# Patient Record
Sex: Female | Born: 1940 | Race: Black or African American | Hispanic: No | Smoking: Never smoker
Health system: Southern US, Community
[De-identification: ages and names within clinical notes are randomized; demographics above are authoritative.]

## PROBLEM LIST (undated history)

## (undated) DIAGNOSIS — I10 Essential (primary) hypertension: Secondary | ICD-10-CM

## (undated) DIAGNOSIS — E785 Hyperlipidemia, unspecified: Secondary | ICD-10-CM

## (undated) DIAGNOSIS — F32A Depression, unspecified: Secondary | ICD-10-CM

## (undated) DIAGNOSIS — R011 Cardiac murmur, unspecified: Secondary | ICD-10-CM

## (undated) DIAGNOSIS — T7840XA Allergy, unspecified, initial encounter: Secondary | ICD-10-CM

## (undated) DIAGNOSIS — R519 Headache, unspecified: Secondary | ICD-10-CM

## (undated) DIAGNOSIS — K219 Gastro-esophageal reflux disease without esophagitis: Secondary | ICD-10-CM

## (undated) DIAGNOSIS — R51 Headache: Secondary | ICD-10-CM

## (undated) DIAGNOSIS — F329 Major depressive disorder, single episode, unspecified: Secondary | ICD-10-CM

## (undated) DIAGNOSIS — H269 Unspecified cataract: Secondary | ICD-10-CM

## (undated) DIAGNOSIS — Z5189 Encounter for other specified aftercare: Secondary | ICD-10-CM

## (undated) DIAGNOSIS — H409 Unspecified glaucoma: Secondary | ICD-10-CM

## (undated) DIAGNOSIS — G8929 Other chronic pain: Secondary | ICD-10-CM

## (undated) DIAGNOSIS — F419 Anxiety disorder, unspecified: Secondary | ICD-10-CM

## (undated) DIAGNOSIS — M1990 Unspecified osteoarthritis, unspecified site: Secondary | ICD-10-CM

## (undated) DIAGNOSIS — J45909 Unspecified asthma, uncomplicated: Secondary | ICD-10-CM

## (undated) HISTORY — DX: Allergy, unspecified, initial encounter: T78.40XA

## (undated) HISTORY — DX: Anxiety disorder, unspecified: F41.9

## (undated) HISTORY — DX: Cardiac murmur, unspecified: R01.1

## (undated) HISTORY — DX: Unspecified osteoarthritis, unspecified site: M19.90

## (undated) HISTORY — DX: Essential (primary) hypertension: I10

## (undated) HISTORY — DX: Hyperlipidemia, unspecified: E78.5

## (undated) HISTORY — DX: Unspecified asthma, uncomplicated: J45.909

## (undated) HISTORY — PX: MASTOIDECTOMY: SHX711

## (undated) HISTORY — DX: Headache, unspecified: R51.9

## (undated) HISTORY — DX: Gastro-esophageal reflux disease without esophagitis: K21.9

## (undated) HISTORY — DX: Depression, unspecified: F32.A

## (undated) HISTORY — DX: Major depressive disorder, single episode, unspecified: F32.9

## (undated) HISTORY — DX: Unspecified glaucoma: H40.9

## (undated) HISTORY — DX: Encounter for other specified aftercare: Z51.89

## (undated) HISTORY — PX: EYE SURGERY: SHX253

## (undated) HISTORY — DX: Unspecified cataract: H26.9

## (undated) HISTORY — DX: Headache: R51

## (undated) HISTORY — PX: SHOULDER OPEN ROTATOR CUFF REPAIR: SHX2407

## (undated) HISTORY — PX: PATENT DUCTUS ARTERIOUS REPAIR: SHX269

## (undated) HISTORY — DX: Other chronic pain: G89.29

---

## 2009-04-09 ENCOUNTER — Encounter: Admission: RE | Admit: 2009-04-09 | Discharge: 2009-04-09 | Payer: Self-pay | Admitting: Unknown Physician Specialty

## 2015-05-16 HISTORY — PX: COLONOSCOPY: SHX174

## 2015-10-10 DIAGNOSIS — I2510 Atherosclerotic heart disease of native coronary artery without angina pectoris: Secondary | ICD-10-CM

## 2015-10-10 DIAGNOSIS — E785 Hyperlipidemia, unspecified: Secondary | ICD-10-CM | POA: Insufficient documentation

## 2015-10-10 HISTORY — DX: Hyperlipidemia, unspecified: E78.5

## 2015-10-10 HISTORY — DX: Atherosclerotic heart disease of native coronary artery without angina pectoris: I25.10

## 2016-03-04 ENCOUNTER — Ambulatory Visit: Payer: Self-pay | Admitting: Neurology

## 2016-08-28 DIAGNOSIS — K219 Gastro-esophageal reflux disease without esophagitis: Secondary | ICD-10-CM | POA: Insufficient documentation

## 2016-08-28 DIAGNOSIS — I10 Essential (primary) hypertension: Secondary | ICD-10-CM

## 2016-08-28 HISTORY — DX: Essential (primary) hypertension: I10

## 2016-09-02 DIAGNOSIS — K5900 Constipation, unspecified: Secondary | ICD-10-CM

## 2016-09-02 DIAGNOSIS — I10 Essential (primary) hypertension: Secondary | ICD-10-CM

## 2016-09-02 DIAGNOSIS — M79605 Pain in left leg: Secondary | ICD-10-CM

## 2016-09-02 DIAGNOSIS — F419 Anxiety disorder, unspecified: Secondary | ICD-10-CM

## 2016-09-02 DIAGNOSIS — I209 Angina pectoris, unspecified: Secondary | ICD-10-CM

## 2016-09-02 DIAGNOSIS — E7800 Pure hypercholesterolemia, unspecified: Secondary | ICD-10-CM

## 2016-09-02 DIAGNOSIS — R208 Other disturbances of skin sensation: Secondary | ICD-10-CM

## 2016-09-02 DIAGNOSIS — K219 Gastro-esophageal reflux disease without esophagitis: Secondary | ICD-10-CM

## 2016-10-19 DIAGNOSIS — R0602 Shortness of breath: Secondary | ICD-10-CM | POA: Diagnosis not present

## 2016-10-19 DIAGNOSIS — I161 Hypertensive emergency: Secondary | ICD-10-CM | POA: Diagnosis not present

## 2016-12-08 DIAGNOSIS — Z87728 Personal history of other specified (corrected) congenital malformations of nervous system and sense organs: Secondary | ICD-10-CM | POA: Diagnosis not present

## 2016-12-08 DIAGNOSIS — R42 Dizziness and giddiness: Secondary | ICD-10-CM | POA: Diagnosis not present

## 2016-12-08 DIAGNOSIS — Z9889 Other specified postprocedural states: Secondary | ICD-10-CM | POA: Diagnosis not present

## 2016-12-11 DIAGNOSIS — I951 Orthostatic hypotension: Secondary | ICD-10-CM | POA: Diagnosis not present

## 2016-12-25 DIAGNOSIS — H402211 Chronic angle-closure glaucoma, right eye, mild stage: Secondary | ICD-10-CM | POA: Diagnosis not present

## 2016-12-29 DIAGNOSIS — Z974 Presence of external hearing-aid: Secondary | ICD-10-CM | POA: Diagnosis not present

## 2016-12-29 DIAGNOSIS — Z9889 Other specified postprocedural states: Secondary | ICD-10-CM | POA: Diagnosis not present

## 2016-12-29 DIAGNOSIS — E785 Hyperlipidemia, unspecified: Secondary | ICD-10-CM | POA: Diagnosis not present

## 2016-12-29 DIAGNOSIS — I252 Old myocardial infarction: Secondary | ICD-10-CM | POA: Diagnosis not present

## 2016-12-29 DIAGNOSIS — R51 Headache: Secondary | ICD-10-CM | POA: Diagnosis not present

## 2016-12-29 DIAGNOSIS — J449 Chronic obstructive pulmonary disease, unspecified: Secondary | ICD-10-CM | POA: Diagnosis not present

## 2016-12-29 DIAGNOSIS — Z885 Allergy status to narcotic agent status: Secondary | ICD-10-CM | POA: Diagnosis not present

## 2016-12-29 DIAGNOSIS — I2510 Atherosclerotic heart disease of native coronary artery without angina pectoris: Secondary | ICD-10-CM | POA: Diagnosis not present

## 2016-12-29 DIAGNOSIS — I110 Hypertensive heart disease with heart failure: Secondary | ICD-10-CM | POA: Diagnosis not present

## 2016-12-29 DIAGNOSIS — H538 Other visual disturbances: Secondary | ICD-10-CM | POA: Diagnosis not present

## 2016-12-29 DIAGNOSIS — Z888 Allergy status to other drugs, medicaments and biological substances status: Secondary | ICD-10-CM | POA: Diagnosis not present

## 2016-12-29 DIAGNOSIS — Z09 Encounter for follow-up examination after completed treatment for conditions other than malignant neoplasm: Secondary | ICD-10-CM | POA: Diagnosis not present

## 2016-12-29 DIAGNOSIS — I509 Heart failure, unspecified: Secondary | ICD-10-CM | POA: Diagnosis not present

## 2016-12-29 DIAGNOSIS — Q019 Encephalocele, unspecified: Secondary | ICD-10-CM | POA: Diagnosis not present

## 2016-12-29 DIAGNOSIS — H9190 Unspecified hearing loss, unspecified ear: Secondary | ICD-10-CM | POA: Diagnosis not present

## 2017-01-01 DIAGNOSIS — Z6829 Body mass index (BMI) 29.0-29.9, adult: Secondary | ICD-10-CM | POA: Diagnosis not present

## 2017-01-01 DIAGNOSIS — I10 Essential (primary) hypertension: Secondary | ICD-10-CM | POA: Diagnosis not present

## 2017-01-01 DIAGNOSIS — Z23 Encounter for immunization: Secondary | ICD-10-CM | POA: Diagnosis not present

## 2017-01-28 DIAGNOSIS — H402211 Chronic angle-closure glaucoma, right eye, mild stage: Secondary | ICD-10-CM | POA: Diagnosis not present

## 2017-03-10 DIAGNOSIS — G501 Atypical facial pain: Secondary | ICD-10-CM | POA: Diagnosis not present

## 2017-03-10 DIAGNOSIS — I10 Essential (primary) hypertension: Secondary | ICD-10-CM | POA: Diagnosis not present

## 2017-03-10 DIAGNOSIS — H6121 Impacted cerumen, right ear: Secondary | ICD-10-CM | POA: Diagnosis not present

## 2017-03-10 DIAGNOSIS — H9203 Otalgia, bilateral: Secondary | ICD-10-CM | POA: Diagnosis not present

## 2017-03-10 DIAGNOSIS — R51 Headache: Secondary | ICD-10-CM | POA: Diagnosis not present

## 2017-03-13 ENCOUNTER — Other Ambulatory Visit: Payer: Self-pay | Admitting: *Deleted

## 2017-03-13 NOTE — Patient Outreach (Addendum)
[facility]) Care Management  03/13/2017  Sheila [name] 1940-09-11 [id number]   Member identified as high risk according to [employer] health questionnaire.  Call placed to introduce [facility] care management services and perform telephone screening.  No answer, mailbox full, unable to leave a message.  Will await call back, if no call back, will follow up within the next week.    Update @ 1305:  Call received back from member stating she is at work and not a good time to talk.  She request this care manager call back Monday morning to discuss health management.  Will follow up next week.   Valente David, [place], MSN [place] [phone]

## 2017-03-16 ENCOUNTER — Other Ambulatory Visit: Payer: Self-pay | Admitting: *Deleted

## 2017-03-16 NOTE — Patient Outreach (Signed)
[place] [facility]) Care Management  03/16/2017  Rever Pichette [birth year] [id number]   Call placed back to member this morning per her request.  Identity verified, this care manager introduces self and purpose of call.  She lives alone and is independent in her care, however she report that in the past year she has had concerns regarding pain control, hypertension management, and balance.  She report seeing her primary MD, Dr. Ann Held, on a regular basis for these reasons.  She state she is in the process of getting referrals for specialists to help with managing these concerns.    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  She report compliance with medications, but express concern regarding affordability of Ranexa and eyedrops for her glaucoma/cataracts.    Member receptive to assistance from nurse case manager and pharmacist, denies the need for social worker involvement at this time.  Will place referrals.  Valente David, [place], MSN [place] [phone]

## 2017-03-17 ENCOUNTER — Encounter: Payer: Self-pay | Admitting: *Deleted

## 2017-03-19 ENCOUNTER — Other Ambulatory Visit: Payer: Self-pay

## 2017-03-19 DIAGNOSIS — M542 Cervicalgia: Secondary | ICD-10-CM | POA: Diagnosis not present

## 2017-03-19 DIAGNOSIS — H9201 Otalgia, right ear: Secondary | ICD-10-CM | POA: Diagnosis not present

## 2017-03-19 DIAGNOSIS — Z6829 Body mass index (BMI) 29.0-29.9, adult: Secondary | ICD-10-CM | POA: Diagnosis not present

## 2017-03-19 NOTE — Patient Outreach (Signed)
New referral care coordination: New referral to assist patient with hypertension, chronic pain and safety.  Placed call to patient and explained reason for call.  Patient confirmed identity.    Offered home visit and patient accepted for 03/24/2017  PLAN: home visit for assessment of needs on 03/24/2017 at 1:30. Confirmed address and provided my contact information.  Tomasa Rand, RN, BSN, CEN [facility] [employer] [phone]

## 2017-03-24 ENCOUNTER — Other Ambulatory Visit: Payer: Self-pay

## 2017-03-24 NOTE — Patient Outreach (Signed)
[facility]) Care Management   03/24/2017  Sheila [name] Mar 08, 1941 [id number]  Sheila [name] is an 76 y.o. female Arrived for home visit at 1:30pm Subjective: Patient reports that she was doing find until about a year ago she developed right ear pain. Patient reports that she has a surgery to patch a "C leak"  Reports that she continues to have balance problems, headaches and ear pain.  Had a recent MRA and was told she has another leak.  Patient reports that she is going to [facility] tomorrow for an evaluation.   Objective:   Vitals:   03/24/17 1417  BP: (!) 154/72  Pulse: 63  Resp: 18  SpO2: 95%  Weight: 164 lb (74.4 kg)  Height: 1.626 m (5\' 4" )  Awake and alert. Ambulating well today in the home.  Review of Systems  Constitutional: Negative.   HENT: Positive for ear pain.        Reports occasional nasal drainage  Eyes: Negative.   Respiratory: Positive for stridor.        Denies  Cardiovascular: Positive for chest pain.       Reports occasional chest pain.  Gastrointestinal: Negative.   Genitourinary: Negative.   Musculoskeletal: Positive for joint pain.       Shoulder pain.   Skin: Negative.   Neurological: Positive for dizziness and headaches.  Endo/Heme/Allergies: Bruises/bleeds easily.  Psychiatric/Behavioral: Positive for memory loss. The patient is nervous/anxious.        With MRI testing    Physical Exam  Constitutional: She is oriented to person, place, and time. She appears well-developed.  Cardiovascular: Normal rate, normal heart sounds and intact distal pulses.   Respiratory: Effort normal and breath sounds normal.  GI: Soft. Bowel sounds are normal.  Musculoskeletal: Normal range of motion. She exhibits no edema.  Neurological: She is alert and oriented to person, place, and time.  2018, trump, Tuesday July  Skin: Skin is warm and dry.  Psychiatric: She has a normal mood and affect. Her behavior is normal. Thought content normal.     Encounter Medications:   Outpatient Encounter Prescriptions as of 03/24/2017  Medication Sig  . acetaminophen (TYLENOL) 500 MG tablet Take 500 mg by mouth every 6 (six) hours as needed.  . ALPRAZolam (XANAX) 0.25 MG tablet Take 0.25 mg by mouth 2 (two) times daily as needed for anxiety.  Marland Kitchen aspirin EC 81 MG tablet Take 81 mg by mouth daily.  Marland Kitchen atorvastatin (LIPITOR) 20 MG tablet Take 20 mg by mouth daily.  . brimonidine (ALPHAGAN P) 0.1 % SOLN 1 drop every 12 (twelve) hours.  . carvedilol (COREG) 6.25 MG tablet Take 6.25 mg by mouth 2 (two) times daily with a meal.  . cloNIDine (CATAPRES) 0.1 MG tablet Take 0.1 mg by mouth 2 (two) times daily.  . Coenzyme Q10 (CO Q-10) 200 MG CAPS Take 1 tablet by mouth every other day.  . dorzolamide (TRUSOPT) 2 % ophthalmic solution 1 drop 3 (three) times daily.  Marland Kitchen doxycycline (VIBRAMYCIN) 100 MG capsule Take 100 mg by mouth 2 (two) times daily.  Marland Kitchen ibuprofen (ADVIL,MOTRIN) 200 MG tablet Take 200 mg by mouth every 6 (six) hours as needed.  Marland Kitchen lisinopril (PRINIVIL,ZESTRIL) 10 MG tablet Take 10 mg by mouth 2 (two) times daily.  . meclizine (ANTIVERT) 25 MG tablet Take 25 mg by mouth 3 (three) times daily as needed for dizziness.  Marland Kitchen omeprazole (PRILOSEC) 20 MG capsule Take 20 mg by mouth daily.  . predniSONE (DELTASONE)  20 MG tablet Take 20 mg by mouth daily with breakfast.  . ranolazine (RANEXA) 500 MG 12 hr tablet Take 500 mg by mouth 2 (two) times daily.  . sodium chloride (MURO 128) 5 % ophthalmic ointment 1 application.  . timolol (BETIMOL) 0.5 % ophthalmic solution 1 drop 2 (two) times daily.   No facility-administered encounter medications on file as of 03/24/2017.     Functional Status:   In your present state of health, do you have any difficulty performing the following activities: 03/24/2017  Hearing? Y  Vision? Y  Difficulty concentrating or making decisions? Y  Walking or climbing stairs? N  Dressing or bathing? N  Doing errands, shopping?  N  Preparing Food and eating ? N  Using the Toilet? N  In the past six months, have you accidently leaked urine? N  Do you have problems with loss of bowel control? N  Managing your Medications? Y  Managing your Finances? N  Housekeeping or managing your Housekeeping? N  Some recent data might be hidden    Fall/Depression Screening:    Fall Risk  03/24/2017  Falls in the past year? Yes  Number falls in past yr: 1  Injury with Fall? No  Risk Factor Category  High Fall Risk  Risk for fall due to : Impaired balance/gait  Follow up Falls evaluation completed;Falls prevention discussed   PHQ 2/9 Scores 03/24/2017  PHQ - 2 Score 2  PHQ- 9 Score 5    Assessment:   (1) reviewed [facility] program. Provided a new patient packet. Reviewed consent and written consent obtained. (2) pending MD appointment at [facility].  (3) altered balance and frequent headaches.  (4) high risk for falls. (5) positive depression screening. (6) elevated blood pressure. (7) reports high medication cost.  Plan:  (1) scanned consent into medical record. (2) encouraged patient to make a list of questions to ask prior to office visit. (3) encouraged patient to stand and get balance before walking.   (4) reviewed fall precautions. (5) patient declines wanting to take medications for depression. Reviewed with patient to stay active. (6) reviewed parameters to call MD for elevated BP.  Encouraged patient to monitor BP daily and record. Reviewed low salt diet tear off poster. (7) pending contact with [place].  Care planning and goal setting. Primary goal is to keep C leak fixed at [facility]. Patient is independent and able to care coordinate on her own.  Discussed short term goals.  Will plan to follow up with patient in the next 2 weeks.  Will send this note and barrier letter to MD.   [facility] CM Care Plan Problem One     Most Recent Value  Care Plan Problem One  Uncontrolled blood pressure.  Role  Documenting the Problem One  Care Management [place] for Problem One  Active  THN CM Short Term Goal #1   Patient will report monitoring and recording BP daily for the next 2 weeks.  THN CM Short Term Goal #1 Start Date  03/24/17  Interventions for Short Term Goal #1  Reviewed low salt diet. Provided low salt diet tear off poster.   THN CM Short Term Goal #2   Patient will report exercising twice a week for the next 2 weeks.  THN CM Short Term Goal #2 Start Date  03/24/17  Interventions for Short Term Goal #2  Reviewed importance of exercise and benefits.   THN CM Short Term Goal #3  Patient will  report no falls in the next 2 weeks.   THN CM Short Term Goal #3 Start Date  03/17/17  Interventions for Short Tern Goal #3  Reviewed importance of gaining balance before walking. reviewed importance of using assistive devices if needed.      Tomasa Rand, RN, BSN, CEN [facility] [employer] [phone]

## 2017-03-25 DIAGNOSIS — R220 Localized swelling, mass and lump, head: Secondary | ICD-10-CM | POA: Diagnosis not present

## 2017-03-25 DIAGNOSIS — Z9889 Other specified postprocedural states: Secondary | ICD-10-CM | POA: Diagnosis not present

## 2017-03-25 DIAGNOSIS — R51 Headache: Secondary | ICD-10-CM | POA: Diagnosis not present

## 2017-03-25 DIAGNOSIS — M542 Cervicalgia: Secondary | ICD-10-CM | POA: Diagnosis not present

## 2017-03-25 DIAGNOSIS — Q018 Encephalocele of other sites: Secondary | ICD-10-CM | POA: Diagnosis not present

## 2017-03-26 DIAGNOSIS — Z9889 Other specified postprocedural states: Secondary | ICD-10-CM | POA: Diagnosis not present

## 2017-03-26 DIAGNOSIS — R42 Dizziness and giddiness: Secondary | ICD-10-CM | POA: Diagnosis not present

## 2017-03-26 DIAGNOSIS — I10 Essential (primary) hypertension: Secondary | ICD-10-CM | POA: Diagnosis not present

## 2017-03-26 DIAGNOSIS — R51 Headache: Secondary | ICD-10-CM | POA: Diagnosis not present

## 2017-03-30 DIAGNOSIS — Z9089 Acquired absence of other organs: Secondary | ICD-10-CM | POA: Diagnosis not present

## 2017-03-30 DIAGNOSIS — M542 Cervicalgia: Secondary | ICD-10-CM | POA: Diagnosis not present

## 2017-03-30 DIAGNOSIS — G960 Cerebrospinal fluid leak: Secondary | ICD-10-CM | POA: Diagnosis not present

## 2017-03-30 DIAGNOSIS — J3489 Other specified disorders of nose and nasal sinuses: Secondary | ICD-10-CM | POA: Diagnosis not present

## 2017-03-31 ENCOUNTER — Other Ambulatory Visit: Payer: Self-pay | Admitting: Pharmacist

## 2017-03-31 NOTE — Patient Outreach (Signed)
[place] [facility]) Care Management  03/31/2017  Sonny Poth 08-30-1941 [id number]  Patient was referred to [place] Pharmacist by [facility] RN [facility] for medication assistance cost concerns.  Successful phone outreach to patient----HIPAA details verified.   Patient reports she is at [facility] to schedule a call back for a more convenient time and patient requested this.   Plan:  Will attempt to reach patient as scheduled at a more convenient time for her.   Karrie Meres, PharmD, [place] [phone]

## 2017-04-01 ENCOUNTER — Other Ambulatory Visit: Payer: Self-pay | Admitting: Pharmacist

## 2017-04-01 NOTE — Patient Outreach (Signed)
[place] [facility]) Care Management  [place]   04/01/2017  Sheila [name] [birth year] [id number]  Subjective:  Patient referred to [place] by [place] for patient cost concerns with an eye drop and ranolazine.   Successful phone outreach to patient, HIPAA details verified with patient.    Patient denies concerns with medication adherence.  She states she has since resolved her eye drop medication cost concerns.  She reports she is concerned with cost of Ranexa.   She reports she has [employer] MA-PDP.     Objective:   Current Medications: Current Outpatient Prescriptions  Medication Sig Dispense Refill  . acetaminophen (TYLENOL) 500 MG tablet Take 500 mg by mouth every 6 (six) hours as needed.    . ALPRAZolam (XANAX) 0.25 MG tablet Take 0.25 mg by mouth 2 (two) times daily as needed for anxiety.    Marland Kitchen aspirin EC 81 MG tablet Take 81 mg by mouth daily.    Marland Kitchen atorvastatin (LIPITOR) 20 MG tablet Take 20 mg by mouth daily.    . brimonidine (ALPHAGAN P) 0.1 % SOLN 1 drop every 12 (twelve) hours.    . carvedilol (COREG) 6.25 MG tablet Take 6.25 mg by mouth 2 (two) times daily with a meal.    . cloNIDine (CATAPRES) 0.1 MG tablet Take 0.1 mg by mouth 2 (two) times daily.    . Coenzyme Q10 (CO Q-10) 200 MG CAPS Take 1 tablet by mouth every other day.    . dorzolamide (TRUSOPT) 2 % ophthalmic solution 1 drop 3 (three) times daily.    Marland Kitchen doxycycline (VIBRAMYCIN) 100 MG capsule Take 100 mg by mouth 2 (two) times daily.    Marland Kitchen ibuprofen (ADVIL,MOTRIN) 200 MG tablet Take 200 mg by mouth every 6 (six) hours as needed.    Marland Kitchen lisinopril (PRINIVIL,ZESTRIL) 10 MG tablet Take 10 mg by mouth 2 (two) times daily.    . meclizine (ANTIVERT) 25 MG tablet Take 25 mg by mouth 3 (three) times daily as needed for dizziness.    Marland Kitchen omeprazole (PRILOSEC) 20 MG capsule Take 20 mg by mouth daily.    . predniSONE (DELTASONE) 20 MG tablet Take 20 mg by mouth daily with breakfast.     . ranolazine (RANEXA) 500 MG 12 hr tablet Take 500 mg by mouth 2 (two) times daily.    . sodium chloride (MURO 128) 5 % ophthalmic ointment 1 application.    . timolol (BETIMOL) 0.5 % ophthalmic solution 1 drop 2 (two) times daily.     No current facility-administered medications for this visit.     Functional Status: In your present state of health, do you have any difficulty performing the following activities: 03/24/2017  Hearing? Y  Vision? Y  Difficulty concentrating or making decisions? Y  Walking or climbing stairs? N  Dressing or bathing? N  Doing errands, shopping? N  Preparing Food and eating ? N  Using the Toilet? N  In the past six months, have you accidently leaked urine? N  Do you have problems with loss of bowel control? N  Managing your Medications? Y  Managing your Finances? N  Housekeeping or managing your Housekeeping? N  Some recent data might be hidden    Fall/Depression Screening: Fall Risk  03/24/2017  Falls in the past year? Yes  Number falls in past yr: 1  Injury with Fall? No  Risk Factor Category  High Fall Risk  Risk for fall due to : Impaired balance/gait  Follow up Falls evaluation completed;Falls prevention discussed   PHQ 2/9 Scores 03/24/2017  PHQ - 2 Score 2  PHQ- 9 Score 5    Assessment:  Medication review per patient report:   Drugs sorted by system:  Neurologic/Psychologic: -alprazolam as needed -meclizine  Cardiovascular: -aspirin -atorvastatin---reports she takes every other day  -carvedilol -clonidine---reports she only takes when systolic blood pressure >627 -lisinopril  -ranolazine (Ranexa)   Gastrointestinal: -omeprazole   Topical: -brimonidine eye drops  -dorzolamide eye drops  -sodium chloride eye drops -timolol eye drops   Pain: -acetaminophen as needed -ibuprofen as needed   Miscellaneous: -co-Q-10   Medications to avoid in the elderly:  -meclizine---increased risk of falls in >75 year old  population  Other issues noted:  -patient reports she completed doxycycline and prednisone course and these were removed from her medication list.   -suggest close monitoring of patient's renal function with ACE inhibitor and NSAID use  Medication assistance:  Patient reports income exceeds [facility] Extra Help requirements.   Had placed call to [employer] prior to calling patient---was told Medicare beneficiaries are evaluated on a case by case basis but generally are not eligible if they have prescription coverage of Ranexa.  Per review of plan preferred drug list, Ranexa is Tier 3 medication.  Discussed with patient she may need to wait until she enters the coverage gap to see if she is eligible for manufacturer assistance based on representative from [employer].    Discussed with patient co-pay structure of her Part D plan and costs when in coverage gap based on review of plan summary of benefits.    Plan:  As patient denies questions about her medications---will not open pharmacy episode.    Will route note to PCP.   Patient provided with [facility] Pharmacist phone number and encouraged to call if new concerns arise.    She was counseled to take her medications as prescribed by her prescribers.     Karrie Meres, PharmD, [place] [phone]

## 2017-04-07 ENCOUNTER — Other Ambulatory Visit: Payer: Self-pay

## 2017-04-07 NOTE — Patient Outreach (Signed)
Follow up telephone call/ case closure:  Placed call to patient who answers and states that she is doing well. Reports that she has her follow up at [facility] and is planning to take a nasal sample back to [facility] when she collects it.  Reports BP is better running 130/70.  Reports that she has been able to do some exercise but reports that she is lazy at times.   Reviewed plan of care and patient denies any other needs. Reports that she will follow up at [facility] as needed.  PLAN: Patient denies any new needs and agrees to case closure.   Will notify MD and send patient case closure letter.  Will ask [facility] care management assistant to close case.   THN CM Care Plan Problem One     Most Recent Value  Care Plan Problem One  Uncontrolled blood pressure.  Role Documenting the Problem One  Care Management [place] for Problem One  Active  THN CM Short Term Goal #1   Patient will report monitoring and recording BP daily for the next 2 weeks.  THN CM Short Term Goal #1 Start Date  03/24/17  [facility] CM Short Term Goal #1 Met Date  04/07/17  Interventions for Short Term Goal #1  Reviewed low salt diet. Provided low salt diet tear off poster.   THN CM Short Term Goal #2   Patient will report exercising twice a week for the next 2 weeks.  THN CM Short Term Goal #2 Start Date  03/24/17  [facility] CM Short Term Goal #2 Met Date  04/07/17  Interventions for Short Term Goal #2  Reviewed importance of exercise and benefits.   THN CM Short Term Goal #3  Patient will report no falls in the next 2 weeks.   THN CM Short Term Goal #3 Start Date  03/17/17  [facility] CM Short Term Goal #3 Met Date  04/07/17  Interventions for Short Tern Goal #3  Reviewed importance of gaining balance before walking. reviewed importance of using assistive devices if needed.      Tomasa Rand, RN, BSN, CEN [facility] [employer] [phone]

## 2017-04-27 DIAGNOSIS — I950 Idiopathic hypotension: Secondary | ICD-10-CM | POA: Diagnosis not present

## 2017-04-27 DIAGNOSIS — R0602 Shortness of breath: Secondary | ICD-10-CM | POA: Diagnosis not present

## 2017-04-27 DIAGNOSIS — R42 Dizziness and giddiness: Secondary | ICD-10-CM | POA: Diagnosis not present

## 2017-04-27 DIAGNOSIS — T448X5A Adverse effect of centrally-acting and adrenergic-neuron-blocking agents, initial encounter: Secondary | ICD-10-CM | POA: Diagnosis not present

## 2017-04-27 DIAGNOSIS — E785 Hyperlipidemia, unspecified: Secondary | ICD-10-CM | POA: Diagnosis not present

## 2017-04-27 DIAGNOSIS — I959 Hypotension, unspecified: Secondary | ICD-10-CM | POA: Diagnosis not present

## 2017-04-27 DIAGNOSIS — M542 Cervicalgia: Secondary | ICD-10-CM | POA: Diagnosis not present

## 2017-04-27 DIAGNOSIS — Z79899 Other long term (current) drug therapy: Secondary | ICD-10-CM | POA: Diagnosis not present

## 2017-04-27 DIAGNOSIS — R001 Bradycardia, unspecified: Secondary | ICD-10-CM | POA: Diagnosis not present

## 2017-04-27 DIAGNOSIS — I10 Essential (primary) hypertension: Secondary | ICD-10-CM | POA: Diagnosis not present

## 2017-04-27 DIAGNOSIS — G8929 Other chronic pain: Secondary | ICD-10-CM | POA: Diagnosis not present

## 2017-04-27 DIAGNOSIS — H409 Unspecified glaucoma: Secondary | ICD-10-CM | POA: Diagnosis not present

## 2017-04-27 DIAGNOSIS — R110 Nausea: Secondary | ICD-10-CM | POA: Diagnosis not present

## 2017-04-27 DIAGNOSIS — R51 Headache: Secondary | ICD-10-CM | POA: Diagnosis not present

## 2017-04-27 DIAGNOSIS — I9589 Other hypotension: Secondary | ICD-10-CM | POA: Diagnosis not present

## 2017-04-28 DIAGNOSIS — I10 Essential (primary) hypertension: Secondary | ICD-10-CM | POA: Diagnosis not present

## 2017-04-28 DIAGNOSIS — I959 Hypotension, unspecified: Secondary | ICD-10-CM | POA: Diagnosis not present

## 2017-04-28 DIAGNOSIS — R51 Headache: Secondary | ICD-10-CM | POA: Diagnosis not present

## 2017-04-28 DIAGNOSIS — R42 Dizziness and giddiness: Secondary | ICD-10-CM | POA: Diagnosis not present

## 2017-04-28 DIAGNOSIS — R001 Bradycardia, unspecified: Secondary | ICD-10-CM | POA: Diagnosis not present

## 2017-05-01 ENCOUNTER — Other Ambulatory Visit: Payer: Self-pay

## 2017-05-01 NOTE — Patient Outreach (Signed)
New referral for screening: [employer].  New referral for patient who was recently discharged from outlying facility.   Spoke with office staff who reports that patient was admitted to [facility] on 04/27/2017 and discharged on 04/28/2017 for dizziness and bradycardia.  Reviewed Medical Record at [facility] and appears patient went to the emergency department for hypertension and was given medication that dropped her heart rate and blood pressure.   Patient was discharged home with medication changes.  Placed call to patient with no answer. Voicemail states that it is full and unable to leave a message.  PLAN: will contact patient on next business day Tomasa Rand, Therapist, sports, Copywriter, advertising, [facility] [employer] [phone].

## 2017-05-04 ENCOUNTER — Other Ambulatory Visit: Payer: Self-pay

## 2017-05-04 DIAGNOSIS — I10 Essential (primary) hypertension: Secondary | ICD-10-CM | POA: Diagnosis not present

## 2017-05-04 DIAGNOSIS — Z6829 Body mass index (BMI) 29.0-29.9, adult: Secondary | ICD-10-CM | POA: Diagnosis not present

## 2017-05-04 DIAGNOSIS — Z23 Encounter for immunization: Secondary | ICD-10-CM | POA: Diagnosis not present

## 2017-05-04 NOTE — Patient Outreach (Signed)
Screening: Recent patient of mine who had a dizzy episodes and went to the [facility] emergency department with hypertension.  Patient reports that she had a bad side effect of the blood pressure medications that she was given. Reports her BP and heart rate dropped and she thought she was dying.  Reports that she was admitted overnight and then discharged home.  Reports that she occasionally feels weak and short of breath. Reports that she has a follow up scheduled with Dr. Nicki Reaper today and the he will review her medications.    Offered to reopen patient but she does not feel she needs it at this time. States that she has my card and will call me if needed.  PLAN: will no reopen at this time as patient has declined services.  Tomasa Rand, RN, BSN, CEN [facility] [employer] [phone]

## 2017-05-07 DIAGNOSIS — R51 Headache: Secondary | ICD-10-CM | POA: Diagnosis not present

## 2017-05-07 DIAGNOSIS — J019 Acute sinusitis, unspecified: Secondary | ICD-10-CM | POA: Diagnosis not present

## 2017-05-18 DIAGNOSIS — H402211 Chronic angle-closure glaucoma, right eye, mild stage: Secondary | ICD-10-CM | POA: Diagnosis not present

## 2017-07-06 DIAGNOSIS — I10 Essential (primary) hypertension: Secondary | ICD-10-CM | POA: Diagnosis not present

## 2017-07-06 DIAGNOSIS — R51 Headache: Secondary | ICD-10-CM | POA: Diagnosis not present

## 2017-07-07 ENCOUNTER — Ambulatory Visit (INDEPENDENT_AMBULATORY_CARE_PROVIDER_SITE_OTHER): Payer: PPO | Admitting: Cardiology

## 2017-07-07 ENCOUNTER — Encounter: Payer: Self-pay | Admitting: Cardiology

## 2017-07-07 VITALS — BP 102/58 | HR 66 | Ht 63.0 in | Wt 166.8 lb

## 2017-07-07 DIAGNOSIS — I10 Essential (primary) hypertension: Secondary | ICD-10-CM | POA: Diagnosis not present

## 2017-07-07 DIAGNOSIS — E785 Hyperlipidemia, unspecified: Secondary | ICD-10-CM | POA: Diagnosis not present

## 2017-07-07 DIAGNOSIS — I2510 Atherosclerotic heart disease of native coronary artery without angina pectoris: Secondary | ICD-10-CM

## 2017-07-07 NOTE — Patient Instructions (Signed)
Medication Instructions:  Your physician recommends that you continue on your current medications as directed. Please refer to the Current Medication list given to you today.  Labwork: None  Testing/Procedures: None  Follow-Up: Your physician recommends that you schedule a follow-up appointment in: 6 months  Any Other Special Instructions Will Be Listed Below (If Applicable).     If you need a refill on your cardiac medications before your next appointment, please call your pharmacy.   [facility]  Chalsey Leeth A, RN, BSN  

## 2017-07-07 NOTE — Progress Notes (Signed)
Cardiology Office Note:    Date:  07/07/2017   ID:  Sheila [name], DOB [birth year], MRN [id number]  PCP:  Myer Peer, MD  Cardiologist:  Jenean Lindau, MD   Referring MD: Myer Peer, MD    ASSESSMENT:    1. Coronary artery disease involving native coronary artery of native heart without angina pectoris   2. Essential hypertension   3. Dyslipidemia    PLAN:    In order of problems listed above:  1. I discussed my findings with the patient extensively. Secondary prevention stressed to the patient. Importance of compliance with diet and medications stressed. 2. Her blood pressure stable. She keeps track of her blood pressure extensively and worries about it. I told her to get her mind of the blood pressure. I told her that her blood pressure readings are stable. 3. Importance of regular exercise stressed and she vocalized understanding. Her blood work is followed by her primary care physician. 4. Patient will be seen in follow-up appointment in 6 months or earlier if the patient has any concerns.    Medication Adjustments/Labs and Tests Ordered: Current medicines are reviewed at length with the patient today.  Concerns regarding medicines are outlined above.  No orders of the defined types were placed in this encounter.  No orders of the defined types were placed in this encounter.    History of Present Illness:    Sheila [name] is a 76 y.o. female who is being seen today for the evaluation of coronary artery disease and essential hypertension at the request of Myer Peer, MD. Patient is a pleasant 76 year old female. She has past medical history of coronary artery disease, essential hypertension and dyslipidemia. She has been seen in the emergency room for essential hypertension. She denies any problems at this time and takes care of activities of daily living. No chest pain orthopnea or PND. She had headache like sensations and went to the emergency room and was  evaluated. Her blood pressures were fine and her CT scans of the head was unremarkable. She is seen here today with her friend. She denies any chest pain orthopnea or PND. At the time of my evaluation she is alert awake oriented and in no distress.  Past Medical History:  Diagnosis Date  . Allergy   . Anxiety   . Arthritis   . Asthma   . Blood transfusion without reported diagnosis   . Cataract   . Depression   . GERD (gastroesophageal reflux disease)   . Glaucoma   . Heart murmur   . Hyperlipidemia   . Hypertension     Past Surgical History:  Procedure Laterality Date  . CESAREAN SECTION    . EYE SURGERY    . MASTOIDECTOMY    . PATENT DUCTUS ARTERIOUS REPAIR    . SHOULDER OPEN ROTATOR CUFF REPAIR Bilateral     Current Medications: Current Meds  Medication Sig  . acetaminophen (TYLENOL) 500 MG tablet Take 500 mg by mouth every 6 (six) hours as needed.  . ALPRAZolam (XANAX) 0.25 MG tablet Take 0.25 mg by mouth 2 (two) times daily as needed for anxiety.  Marland Kitchen aspirin EC 81 MG tablet Take 81 mg by mouth daily.  Marland Kitchen atorvastatin (LIPITOR) 20 MG tablet Take 20 mg by mouth every other day.   . brimonidine (ALPHAGAN) 0.15 % ophthalmic solution Place 1 drop into both eyes every 12 (twelve) hours.   . carvedilol (COREG) 6.25 MG tablet Take 6.25 mg by mouth  2 (two) times daily with a meal.  . cloNIDine (CATAPRES) 0.1 MG tablet Take 0.1 mg by mouth 2 (two) times daily.  . Coenzyme Q10 (CO Q-10) 200 MG CAPS Take 1 tablet by mouth every other day.  . dorzolamide (TRUSOPT) 2 % ophthalmic solution Place 1 drop into both eyes daily.   . furosemide (LASIX) 40 MG tablet Take 40 mg by mouth daily as needed.  . hydrALAZINE (APRESOLINE) 50 MG tablet Take 50 mg by mouth 3 (three) times daily.  . hydrochlorothiazide (HYDRODIURIL) 12.5 MG tablet Take 0.5 mg by mouth daily.   Marland Kitchen lisinopril (PRINIVIL,ZESTRIL) 10 MG tablet Take 10 mg by mouth 2 (two) times daily.  . meclizine (ANTIVERT) 25 MG tablet Take  25 mg by mouth 3 (three) times daily as needed for dizziness.  Marland Kitchen omeprazole (PRILOSEC) 20 MG capsule Take 20 mg by mouth daily.  . ranolazine (RANEXA) 500 MG 12 hr tablet Take 500 mg by mouth 2 (two) times daily.  . sodium chloride (MURO 128) 2 % ophthalmic solution Place 1 drop into both eyes at bedtime.  . timolol (BETIMOL) 0.5 % ophthalmic solution Place 1 drop into both eyes 2 (two) times daily.      Allergies:   Antihistamines, chlorpheniramine-type; Ativan [lorazepam]; and Morphine   Social History   Social History  . Marital status: Unknown    Spouse name: N/A  . Number of children: N/A  . Years of education: N/A   Social History Main Topics  . Smoking status: Never Smoker  . Smokeless tobacco: Never Used  . Alcohol use No  . Drug use: No  . Sexual activity: Not Asked   Other Topics Concern  . None   Social History Narrative  . None     Family History: The patient's family history includes Cancer in her brother; Heart attack in her mother and sister.  ROS:   Please see the history of present illness.    All other systems reviewed and are negative.  EKGs/Labs/Other Studies Reviewed:    The following studies were reviewed today: I discussed my findings with the patient at extensive length and hospital records were reviewed by me at length.   Recent Labs: No results found for requested labs within last 8760 hours.  Recent Lipid Panel No results found for: CHOL, TRIG, HDL, CHOLHDL, VLDL, LDLCALC, LDLDIRECT  Physical Exam:    VS:  BP (!) 102/58   Pulse 66   Ht 5\' 3"  (1.6 m)   Wt 166 lb 12.8 oz (75.7 kg)   SpO2 96%   BMI 29.55 kg/m     Wt Readings from Last 3 Encounters:  07/07/17 166 lb 12.8 oz (75.7 kg)  03/24/17 164 lb (74.4 kg)     GEN: Patient is in no acute distress HEENT: Normal NECK: No JVD; No carotid bruits LYMPHATICS: No lymphadenopathy CARDIAC: S1 S2 regular, 2/6 systolic murmur at the apex. RESPIRATORY:  Clear to auscultation without  rales, wheezing or rhonchi  ABDOMEN: Soft, non-tender, non-distended MUSCULOSKELETAL:  No edema; No deformity  SKIN: Warm and dry NEUROLOGIC:  Alert and oriented x 3 PSYCHIATRIC:  Normal affect    Signed, Jenean Lindau, MD  07/07/2017 2:40 PM    [facility]

## 2017-08-07 DIAGNOSIS — H8109 Meniere's disease, unspecified ear: Secondary | ICD-10-CM | POA: Diagnosis not present

## 2017-08-07 DIAGNOSIS — I10 Essential (primary) hypertension: Secondary | ICD-10-CM | POA: Diagnosis not present

## 2017-08-07 DIAGNOSIS — Z6829 Body mass index (BMI) 29.0-29.9, adult: Secondary | ICD-10-CM | POA: Diagnosis not present

## 2017-08-07 DIAGNOSIS — M791 Myalgia, unspecified site: Secondary | ICD-10-CM | POA: Diagnosis not present

## 2017-08-29 DIAGNOSIS — R51 Headache: Secondary | ICD-10-CM | POA: Diagnosis not present

## 2017-09-10 DIAGNOSIS — H402211 Chronic angle-closure glaucoma, right eye, mild stage: Secondary | ICD-10-CM | POA: Diagnosis not present

## 2017-09-24 DIAGNOSIS — Z6829 Body mass index (BMI) 29.0-29.9, adult: Secondary | ICD-10-CM | POA: Diagnosis not present

## 2017-09-24 DIAGNOSIS — R0989 Other specified symptoms and signs involving the circulatory and respiratory systems: Secondary | ICD-10-CM | POA: Diagnosis not present

## 2017-09-24 DIAGNOSIS — R2689 Other abnormalities of gait and mobility: Secondary | ICD-10-CM | POA: Diagnosis not present

## 2017-09-28 DIAGNOSIS — R51 Headache: Secondary | ICD-10-CM | POA: Diagnosis not present

## 2017-09-28 DIAGNOSIS — H6123 Impacted cerumen, bilateral: Secondary | ICD-10-CM | POA: Diagnosis not present

## 2017-10-02 DIAGNOSIS — R42 Dizziness and giddiness: Secondary | ICD-10-CM | POA: Diagnosis not present

## 2017-10-02 DIAGNOSIS — R0989 Other specified symptoms and signs involving the circulatory and respiratory systems: Secondary | ICD-10-CM | POA: Diagnosis not present

## 2017-10-22 ENCOUNTER — Encounter (INDEPENDENT_AMBULATORY_CARE_PROVIDER_SITE_OTHER): Payer: Self-pay

## 2017-10-22 ENCOUNTER — Encounter: Payer: Self-pay | Admitting: Neurology

## 2017-10-22 ENCOUNTER — Ambulatory Visit (INDEPENDENT_AMBULATORY_CARE_PROVIDER_SITE_OTHER): Payer: PPO | Admitting: Neurology

## 2017-10-22 VITALS — BP 126/65 | HR 61 | Ht 63.0 in | Wt 166.0 lb

## 2017-10-22 DIAGNOSIS — R51 Headache: Secondary | ICD-10-CM | POA: Diagnosis not present

## 2017-10-22 DIAGNOSIS — R42 Dizziness and giddiness: Secondary | ICD-10-CM | POA: Diagnosis not present

## 2017-10-22 DIAGNOSIS — R519 Headache, unspecified: Secondary | ICD-10-CM

## 2017-10-22 NOTE — Progress Notes (Signed)
Subjective:    Patient ID: Sheila [name] is a 77 y.o. female.  HPI     Star Age, MD, PhD [facility] [address], [address] [place], [place] [address]  Dear Dr. Nicki Reaper,   I saw your patient, Sheila [name], upon your kind request in my neurologic clinic today for initial consultation of her balance problem. The patient is unaccompanied today. 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 I reviewed her extensive records that you kindly sent. She had been followed at [facility] neurosurgery by Dr. Rogers Blocker, last seen in 2018 at which time patient reported improvement in her dizziness and recurrent headaches. Headaches were still expected according to Dr. Rogers Blocker note. She had interim right facial swelling and tenderness in the neck and shoulder area. Which she was treated with antibiotics. She had a repeat brain MRI at the time in July 2018. This did not show any new changes I reviewed your office note from 09/24/2017. She has had multiple scans in the recent past. She had not had a scan at [facility]. I did review a brain MRI report with and without contrast on 10/21/2016 done at [facility] with indication of recurrent right-sided head pain and dizziness. Impression: No acute intracranial abnormality. Postoperative changes and evidence of remote subarachnoid hemorrhage.  She presented to [facility] in [place] on 07/06/2017 with acute on chronic right temporoparietal headaches. I reviewed the ER records.   Her last MRI was at [facility] on 03/26/18 and I reviewed the report, which she brought: Impression: No acute intracranial abnormality or abnormal enhancement of the brain. Stable mild chronic microvascular ischemic changes and mild parenchymal volume loss of the brain. 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 Postsurgical changes related to right mastoidectomy and temporal craniotomy. No fluid collection or abnormal enhancement. She went to the emergency room at [facility] in January. She reports having had a head CT at the time. I do not see records in the computer of this and she did not have a report with her. She says that she was treated with medication for her headache which helped a little bit. Of note, she was supposed to have some physical therapy last time she saw her neurosurgeon at [facility] but she never got a referral. She has a cane available as well as a walker, she has thankfully not fallen but has stumbled. She describes a sense of pulsatile pressure sensation on the top of her head and also with sudden position changes. Some of her symptoms remind her of the symptoms she had prior to her original surgery in 2017. One of her sons lives with her. She is divorced, she has 5 children, 2 and Nora, one in [place] and one in [place]. 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 She is a nonsmoker and does not drink alcohol and does not drink caffeine daily. Her original tinnitus surgery was done under Dr. Cresenciano Lick, ENT.  Her Past Medical History Is Significant For: Past Medical History:  Diagnosis Date  . Allergy   . Anxiety   .  Arthritis   . Asthma   . Blood transfusion without reported diagnosis   . Cataract   . Depression   . GERD (gastroesophageal reflux disease)   . Glaucoma   . Heart murmur   . Hyperlipidemia   . Hypertension     Her Past Surgical History Is Significant For: Past Surgical History:  Procedure  Laterality Date  . CESAREAN SECTION    . EYE SURGERY    . MASTOIDECTOMY    . PATENT DUCTUS ARTERIOUS REPAIR    . SHOULDER OPEN ROTATOR CUFF REPAIR Bilateral     Her Family History Is Significant For: Family History  Problem Relation Age of Onset  . Heart attack Mother   . Heart attack Sister   . Cancer Brother     Her Social History Is Significant For: Social History   Socioeconomic History  . Marital status: Unknown    Spouse name: None  . Number of children: None  . Years of education: None  . Highest education level: None  Social Needs  . Financial resource strain: None  . Food insecurity - worry: None  . Food insecurity - inability: None  . Transportation needs - medical: None  . Transportation needs - non-medical: None  Occupational History  . None  Tobacco Use  . Smoking status: Never Smoker  . Smokeless tobacco: Never Used  Substance and Sexual Activity  . Alcohol use: No  . Drug use: No  . Sexual activity: None  Other Topics Concern  . None  Social History Narrative  . None    Her Allergies Are:  Allergies  Allergen Reactions  . Antihistamines, Chlorpheniramine-Type Anaphylaxis  . Ativan [Lorazepam] Swelling  . Ceftin [Cefuroxime Axetil]   . Meloxicam   . Pheneen [Benzalkonium Chloride]   . Morphine Palpitations  :   Her Current Medications Are:  Outpatient Encounter Medications as of 10/22/2017  Medication Sig  . acetaminophen (TYLENOL) 500 MG tablet Take 500 mg by mouth every 6 (six) hours as needed.  . ALPRAZolam (XANAX) 0.25 MG tablet Take 0.25 mg by mouth 2 (two) times daily as needed for anxiety.  Marland Kitchen aspirin EC 81 MG tablet Take 81 mg by mouth daily.  Marland Kitchen atorvastatin (LIPITOR) 20 MG tablet Take 20 mg by mouth every other day.   . brimonidine (ALPHAGAN) 0.15 % ophthalmic solution Place 1 drop into both eyes every 12 (twelve) hours.   . carvedilol (COREG) 6.25 MG tablet Take 6.25 mg by mouth 2 (two) times daily with a meal.  . cloNIDine  (CATAPRES) 0.1 MG tablet Take 0.1 mg by mouth 2 (two) times daily.  . Coenzyme Q10 (CO Q-10) 200 MG CAPS Take 1 tablet by mouth every other day.  . dorzolamide (TRUSOPT) 2 % ophthalmic solution Place 1 drop into both eyes daily.   . furosemide (LASIX) 40 MG tablet Take 40 mg by mouth daily as needed.  . hydrochlorothiazide (HYDRODIURIL) 12.5 MG tablet Take 0.5 mg by mouth daily.   Marland Kitchen lisinopril (PRINIVIL,ZESTRIL) 10 MG tablet Take 10 mg by mouth 2 (two) times daily.  . meclizine (ANTIVERT) 25 MG tablet Take 25 mg by mouth 3 (three) times daily as needed for dizziness.  Marland Kitchen omeprazole (PRILOSEC) 20 MG capsule Take 20 mg by mouth daily.  . ranolazine (RANEXA) 500 MG 12 hr tablet Take 500 mg by mouth 2 (two) times daily.  . sodium chloride (MURO 128) 2 % ophthalmic solution Place 1 drop into both eyes at bedtime.  . timolol (BETIMOL)  0.5 % ophthalmic solution Place 1 drop into both eyes 2 (two) times daily.   . [DISCONTINUED] hydrALAZINE (APRESOLINE) 50 MG tablet Take 50 mg by mouth 3 (three) times daily.   No facility-administered encounter medications on file as of 10/22/2017.   :   Review of Systems:  Out of a complete 14 point review of systems, all are reviewed and negative with the exception of these symptoms as listed below:   Review of Systems  Neurological:       Pt presents today to discuss her headaches and dizziness. For about a year, she has had pressure and "pulsating" in her head. Pt is also complaining of dizziness.    Objective:  Neurological Exam  Physical Exam Physical Examination:   Vitals:   10/22/17 1015  BP: 126/65  Pulse: 61    General Examination: The patient is a very pleasant 77 y.o. female in no significant distress. She appears well-developed and well groomed. On orthostatic testing, she has no significant changes. Lying blood pressure 126/73 with a pulse of 63, sitting 126/65 with a pulse of 61, standing 119/67 with a pulse of 70. She has no orthostatic  symptoms.  HEENT: Pupils are equal, round and reactive to light and accommodation. Extraocular tracking is good without limitation to gaze excursion or nystagmus noted. Normal smooth pursuit is noted. Hearing is grossly intact. Face is symmetric with normal facial animation and normal facial sensation. Speech is clear with no dysarthria noted. There is no hypophonia. There is no lip, neck/head, jaw or voice tremor. Neck is supple with full range of passive and active motion. There are no carotid bruits on auscultation. Oropharynx exam reveals: mild mouth dryness, adequate dental hygiene. Tongue protrudes centrally and palate elevates symmetrically.   Chest: Clear to auscultation without wheezing, rhonchi or crackles noted.  Heart: S1+S2+0, regular and normal without murmurs, rubs or gallops noted.   Abdomen: Soft, non-tender and non-distended with normal bowel sounds appreciated on auscultation.  Extremities: There is no pitting edema in the distal lower extremities bilaterally. Pedal pulses are intact.  Skin: Warm and dry without trophic changes noted.  Musculoskeletal: exam reveals no obvious joint deformities, tenderness or joint swelling or erythema.   Neurologically:  Mental status: The patient is awake, alert and oriented in all 4 spheres. Her immediate and remote memory, attention, language skills and fund of knowledge are appropriate. There is no evidence of aphasia, agnosia, apraxia or anomia. Speech is clear with normal prosody and enunciation. Thought process is linear. Mood is normal and affect is normal.  Cranial nerves II - XII are as described above under HEENT exam. In addition: shoulder shrug is normal with equal shoulder height noted. Motor exam: Normal bulk, strength and tone is noted. There is no drift, tremor or rebound. Romberg is negative. Reflexes are 2+ throughout. Babinski: Toes are flexor bilaterally. Fine motor skills and coordination: intact with normal finger taps,  normal hand movements, normal rapid alternating patting, normal foot taps and normal foot agility.  Cerebellar testing: No dysmetria or intention tremor on finger to nose testing. Heel to shin is unremarkable bilaterally. There is no truncal or gait ataxia.  Sensory exam: intact to light touch, vibration, temperature sense in the upper and lower extremities.  Gait, station and balance: She stands without difficulty. No veering to one side is noted. No leaning to one side is noted. Posture is age-appropriate and stance is narrow based. Gait shows slightly cautious gait, normal stride length.  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 She has been to the emergency room in the recent past twice. Unfortunately, her situation is complicated. Her symptoms resemble the ones that she had before she had her surgeries starting in 2017. She is strongly encouraged go back to see her neurosurgeon. Migraine prevention medication would not be indicated in her case and made mask serious symptomatology. She understands this. Neurologically she has a nonfocal exam which is reassuring. I would not recommend ordering an MRI through a local radiology placed here as she should have her MRI through the same place for comparison. She also understands this. She was supposed to pursue physical therapy which she has not yet. She is encouraged to follow up on the referral status on the physical therapy referral. She is advised to start using her cane or walker for gait  stability, stay well-hydrated. I had an extensive discussion with the patient today. I reviewed extensive records that were available through your paper referral as well as through the electronic record system. She is at this point advised to make an appointment with her neurosurgeon at [facility]. I answered all her questions today and she was in agreement.  Thank you very much for allowing me to participate in the care of this nice patient. If I can be of any further assistance to you please do not hesitate to call me at [phone].  Sincerely,   Star Age, MD, PhD

## 2017-10-22 NOTE — Patient Instructions (Addendum)
I am afraid, I have not much to offer you. I feel bad, that you have had so much trouble with recurrent headaches and dizziness. Your situation is complicated, as you know, I do not see an obvious abnormality on my exam today. You are advised to follow up with your neurosurgeon at [place] may need another MRI, and it needs to be compared to the last one in July 2018.  Your blood pressure values did not drop significantly when you change positions. I would not recommend any additional headache medication as we do not want to mask any other symptoms. The way you are describing your headache and symptoms remind you of your original symptoms from before you had your surgeries. Therefore, you need to see your surgeon again.  Consider seeing physical therapy as planned by Dr. Rogers Blocker.  Please use a walker or cane for safety.

## 2017-11-10 ENCOUNTER — Telehealth: Payer: Self-pay | Admitting: Neurology

## 2017-11-10 DIAGNOSIS — R51 Headache: Secondary | ICD-10-CM | POA: Diagnosis not present

## 2017-11-10 DIAGNOSIS — R519 Headache, unspecified: Secondary | ICD-10-CM | POA: Insufficient documentation

## 2017-11-10 DIAGNOSIS — L818 Other specified disorders of pigmentation: Secondary | ICD-10-CM | POA: Diagnosis not present

## 2017-11-10 DIAGNOSIS — Z9889 Other specified postprocedural states: Secondary | ICD-10-CM | POA: Diagnosis not present

## 2017-11-10 HISTORY — DX: Headache, unspecified: R51.9

## 2017-11-10 NOTE — Telephone Encounter (Signed)
Sheila [name] [phone] (friend on DPR) called, pt had MRI today at [facility], results were normal findings of the brain. Pt was advised by Dr Rogers Blocker she should follow up with Dr Rexene Alberts. She is aware last OV notes state to follow up with PCP but she was insistent on an appt. Please call to advise the patient at [phone].

## 2017-11-12 DIAGNOSIS — R2689 Other abnormalities of gait and mobility: Secondary | ICD-10-CM | POA: Diagnosis not present

## 2017-11-12 DIAGNOSIS — M62552 Muscle wasting and atrophy, not elsewhere classified, left thigh: Secondary | ICD-10-CM | POA: Diagnosis not present

## 2017-11-12 DIAGNOSIS — M62551 Muscle wasting and atrophy, not elsewhere classified, right thigh: Secondary | ICD-10-CM | POA: Diagnosis not present

## 2017-11-12 DIAGNOSIS — M6281 Muscle weakness (generalized): Secondary | ICD-10-CM | POA: Diagnosis not present

## 2017-11-16 NOTE — Telephone Encounter (Signed)
Pt returned my call. I advised her of Dr. Guadelupe Sabin recommendations. Pt says that [facility] did not want to see her again either. I encouraged her to follow up with PCP. Pt verbalized understanding.

## 2017-11-16 NOTE — Telephone Encounter (Signed)
I called pt again to discuss. No answer, VM still full.

## 2017-11-16 NOTE — Telephone Encounter (Signed)
I called pt. I advised her that Dr. Guadelupe Sabin last note says that she does not have much else to offer pt with regards to her headaches and dizziness. Pt insists that Dr. Rexene Alberts told her to have another MRI to make sure that her headaches did not have anything to do with her surgery. If nothing was seen to correlate her headaches with her surgery, pt says that Dr. Rexene Alberts told her to call our office. Pt had an MRI at [facility] that pt reports was normal. I cannot find results of this scan and asked pt to have [facility] send [place] a copy. I will ask Dr. Rexene Alberts about her thoughts on how to proceed forward with this pt. Pt verbalized understanding.

## 2017-11-16 NOTE — Telephone Encounter (Signed)
I called pt to discuss. No answer, no VM set up. Will try again later. 

## 2017-11-16 NOTE — Telephone Encounter (Signed)
My recommendation was that pt FU at [facility] with her neurosurgeon. I really don't have any other suggestions, I am afraid.

## 2017-11-17 DIAGNOSIS — M62551 Muscle wasting and atrophy, not elsewhere classified, right thigh: Secondary | ICD-10-CM | POA: Diagnosis not present

## 2017-11-17 DIAGNOSIS — R2689 Other abnormalities of gait and mobility: Secondary | ICD-10-CM | POA: Diagnosis not present

## 2017-11-17 DIAGNOSIS — M6281 Muscle weakness (generalized): Secondary | ICD-10-CM | POA: Diagnosis not present

## 2017-11-17 DIAGNOSIS — M62552 Muscle wasting and atrophy, not elsewhere classified, left thigh: Secondary | ICD-10-CM | POA: Diagnosis not present

## 2017-11-24 DIAGNOSIS — M6281 Muscle weakness (generalized): Secondary | ICD-10-CM | POA: Diagnosis not present

## 2017-11-24 DIAGNOSIS — M62552 Muscle wasting and atrophy, not elsewhere classified, left thigh: Secondary | ICD-10-CM | POA: Diagnosis not present

## 2017-11-24 DIAGNOSIS — M62551 Muscle wasting and atrophy, not elsewhere classified, right thigh: Secondary | ICD-10-CM | POA: Diagnosis not present

## 2017-11-24 DIAGNOSIS — R2689 Other abnormalities of gait and mobility: Secondary | ICD-10-CM | POA: Diagnosis not present

## 2017-11-26 DIAGNOSIS — M6281 Muscle weakness (generalized): Secondary | ICD-10-CM | POA: Diagnosis not present

## 2017-11-26 DIAGNOSIS — M62551 Muscle wasting and atrophy, not elsewhere classified, right thigh: Secondary | ICD-10-CM | POA: Diagnosis not present

## 2017-11-26 DIAGNOSIS — M62552 Muscle wasting and atrophy, not elsewhere classified, left thigh: Secondary | ICD-10-CM | POA: Diagnosis not present

## 2017-11-26 DIAGNOSIS — R2689 Other abnormalities of gait and mobility: Secondary | ICD-10-CM | POA: Diagnosis not present

## 2017-12-01 DIAGNOSIS — M62552 Muscle wasting and atrophy, not elsewhere classified, left thigh: Secondary | ICD-10-CM | POA: Diagnosis not present

## 2017-12-01 DIAGNOSIS — M62551 Muscle wasting and atrophy, not elsewhere classified, right thigh: Secondary | ICD-10-CM | POA: Diagnosis not present

## 2017-12-01 DIAGNOSIS — M6281 Muscle weakness (generalized): Secondary | ICD-10-CM | POA: Diagnosis not present

## 2017-12-01 DIAGNOSIS — R2689 Other abnormalities of gait and mobility: Secondary | ICD-10-CM | POA: Diagnosis not present

## 2017-12-03 DIAGNOSIS — M6281 Muscle weakness (generalized): Secondary | ICD-10-CM | POA: Diagnosis not present

## 2017-12-03 DIAGNOSIS — M62552 Muscle wasting and atrophy, not elsewhere classified, left thigh: Secondary | ICD-10-CM | POA: Diagnosis not present

## 2017-12-03 DIAGNOSIS — R2689 Other abnormalities of gait and mobility: Secondary | ICD-10-CM | POA: Diagnosis not present

## 2017-12-03 DIAGNOSIS — M62551 Muscle wasting and atrophy, not elsewhere classified, right thigh: Secondary | ICD-10-CM | POA: Diagnosis not present

## 2017-12-07 DIAGNOSIS — J310 Chronic rhinitis: Secondary | ICD-10-CM | POA: Diagnosis not present

## 2017-12-07 DIAGNOSIS — I6789 Other cerebrovascular disease: Secondary | ICD-10-CM | POA: Diagnosis not present

## 2017-12-07 DIAGNOSIS — Z6829 Body mass index (BMI) 29.0-29.9, adult: Secondary | ICD-10-CM | POA: Diagnosis not present

## 2017-12-08 DIAGNOSIS — R0609 Other forms of dyspnea: Secondary | ICD-10-CM | POA: Diagnosis not present

## 2017-12-08 DIAGNOSIS — R072 Precordial pain: Secondary | ICD-10-CM | POA: Diagnosis not present

## 2017-12-08 DIAGNOSIS — R0602 Shortness of breath: Secondary | ICD-10-CM | POA: Diagnosis not present

## 2017-12-08 DIAGNOSIS — R079 Chest pain, unspecified: Secondary | ICD-10-CM | POA: Diagnosis not present

## 2017-12-17 ENCOUNTER — Other Ambulatory Visit: Payer: Self-pay

## 2017-12-17 NOTE — Patient Outreach (Addendum)
[facility]) Care Management  12/17/2017  Sheila [name] May 30, 1941 [id number]  TELEPHONE SCREENING Referral date: 12/09/17 Referral source: [facility] utilization management Referral reason: concern with medication costs, ED visit 12/08/17 Insurance: health team advantage  Telephone call to patient regarding utilization management referral. HIPAA verified with patient. Explained reason for call. Patient states she is concerned that her medication Renexa is very expensive. Patient states she is getting her medications but they are difficult to afford. Patient states she is not taking her cholesterol medication because it makes her body ache. Patient states some of her medications cause her not to feel good at times.  Patient states she uses the [facility] in [place], [place].  Patient states she has had high blood pressure for approximately 15 years. Patient states she has had periods of time where her blood pressure has been elevated. Patient states her blood pressure ranges at its highest  Between 145-160/ 70's. RNCM discussed and offered [facility] care management services to patient  Patient states she would like to have the pharmacist follow up with her regarding her medications and she would like to have a Nurse health coach follow up with her regarding her blood pressure.  RNCM advised patient to notify MD of any changes in condition prior to scheduled appointment. RNCM provided contact name and number: [phone] or main office number [phone] and 24 hour nurse advise line [phone].  RNCM verified patient aware of 911 services for urgent/ emergent needs.   PLAN:  RNCM will refer patient to RN health coach and pharmacist.   Quinn Plowman RN,BSN,CCM [facility] Telephonic  [phone]

## 2017-12-18 ENCOUNTER — Encounter: Payer: Self-pay | Admitting: Neurology

## 2017-12-21 ENCOUNTER — Encounter: Payer: Self-pay | Admitting: *Deleted

## 2017-12-23 DIAGNOSIS — H402222 Chronic angle-closure glaucoma, left eye, moderate stage: Secondary | ICD-10-CM | POA: Diagnosis not present

## 2017-12-23 DIAGNOSIS — Z6828 Body mass index (BMI) 28.0-28.9, adult: Secondary | ICD-10-CM | POA: Diagnosis not present

## 2017-12-23 DIAGNOSIS — R1013 Epigastric pain: Secondary | ICD-10-CM | POA: Diagnosis not present

## 2017-12-24 ENCOUNTER — Ambulatory Visit (INDEPENDENT_AMBULATORY_CARE_PROVIDER_SITE_OTHER): Payer: PPO | Admitting: Cardiology

## 2017-12-24 ENCOUNTER — Encounter: Payer: Self-pay | Admitting: Cardiology

## 2017-12-24 VITALS — BP 120/68 | HR 60 | Ht 63.0 in | Wt 159.8 lb

## 2017-12-24 DIAGNOSIS — E785 Hyperlipidemia, unspecified: Secondary | ICD-10-CM

## 2017-12-24 DIAGNOSIS — I10 Essential (primary) hypertension: Secondary | ICD-10-CM | POA: Diagnosis not present

## 2017-12-24 DIAGNOSIS — I209 Angina pectoris, unspecified: Secondary | ICD-10-CM | POA: Diagnosis not present

## 2017-12-24 DIAGNOSIS — I2510 Atherosclerotic heart disease of native coronary artery without angina pectoris: Secondary | ICD-10-CM

## 2017-12-24 MED ORDER — NITROGLYCERIN 0.4 MG SL SUBL: 0.4000 mg | SUBLINGUAL_TABLET | SUBLINGUAL | 11 refills | Status: DC | PRN

## 2017-12-24 NOTE — Addendum Note (Signed)
Addended by: Mattie Marlin on: 12/24/2017 03:19 PM   Modules accepted: Orders

## 2017-12-24 NOTE — Progress Notes (Signed)
Cardiology Office Note:    Date:  12/24/2017   ID:  Sheila [name], DOB Aug 19, 1941, MRN [id number]  PCP:  Myer Peer, MD  Cardiologist:  Jenean Lindau, MD   Referring MD: Myer Peer, MD    ASSESSMENT:    1. Coronary artery disease involving native coronary artery of native heart without angina pectoris   2. Essential hypertension   3. Dyslipidemia   4. Angina pectoris ([place])    PLAN:    In order of problems listed above:  1. I reviewed the emergency room records extensively.  The patient's symptoms are very concerning.  She has multiple risk factors for coronary artery disease.  She is very concerned about her symptoms and stress up about it.  She is also planning to undergo upper GI evaluation for the same symptoms.  In view of the above I made her the following recommendations.In view of the patient's symptoms, I discussed with the patient options for evaluation. Invasive and noninvasive options were given to the patient. I discussed stress testing and coronary angiography and left heart catheterization at length. Benefits, pros and cons of each approach were discussed at length. Patient had multiple questions which were answered to the patient's satisfaction. Patient opted for invasive evaluation and we will set up for coronary angiography and left heart catheterization. Further recommendations will be made based on the findings with coronary angiography. In the interim if the patient has any significant symptoms in hospital to the nearest emergency room.    Medication Adjustments/Labs and Tests Ordered: Current medicines are reviewed at length with the patient today.  Concerns regarding medicines are outlined above.  No orders of the defined types were placed in this encounter.  No orders of the defined types were placed in this encounter.    Chief Complaint  Patient presents with  . Follow-up  . Coronary Artery Disease     History of Present Illness:     Sheila [name] is a 77 y.o. female.  The patient has past medical history of essential hypertension and dyslipidemia.  She mentions to me that she went to the emergency room with chest tightness.  She said it was severe and scared her.  She mentions to me that it went to the neck and the jaw and tooth her arms.  She was treated in the emergency room evaluated and a coronary event was ruled out and she was discharged.  The symptoms have come back.,  She leads a sedentary lifestyle..  No orthopnea or PND.  At the time of my evaluation, the patient is alert awake oriented and in no distress.  Past Medical History:  Diagnosis Date  . Allergy   . Anxiety   . Arthritis   . Asthma   . Blood transfusion without reported diagnosis   . Cataract   . Depression   . GERD (gastroesophageal reflux disease)   . Glaucoma   . Heart murmur   . Hyperlipidemia   . Hypertension     Past Surgical History:  Procedure Laterality Date  . CESAREAN SECTION    . EYE SURGERY    . MASTOIDECTOMY    . PATENT DUCTUS ARTERIOUS REPAIR    . SHOULDER OPEN ROTATOR CUFF REPAIR Bilateral     Current Medications: No outpatient medications have been marked as taking for the 12/24/17 encounter (Office Visit) with Revankar, Reita Cliche, MD.     Allergies:   Antihistamines, chlorpheniramine-type; Ativan [lorazepam]; Ceftin [cefuroxime axetil]; Meloxicam; Pheneen [  benzalkonium chloride]; and Morphine   Social History   Socioeconomic History  . Marital status: Unknown    Spouse name: Not on file  . Number of children: Not on file  . Years of education: Not on file  . Highest education level: Not on file  Occupational History  . Not on file  Social Needs  . Financial resource strain: Not on file  . Food insecurity:    Worry: Not on file    Inability: Not on file  . Transportation needs:    Medical: Not on file    Non-medical: Not on file  Tobacco Use  . Smoking status: Never Smoker  . Smokeless tobacco: Never  Used  Substance and Sexual Activity  . Alcohol use: No  . Drug use: No  . Sexual activity: Not on file  Lifestyle  . Physical activity:    Days per week: Not on file    Minutes per session: Not on file  . Stress: Not on file  Relationships  . Social connections:    Talks on phone: Not on file    Gets together: Not on file    Attends religious service: Not on file    Active member of club or organization: Not on file    Attends meetings of clubs or organizations: Not on file    Relationship status: Not on file  Other Topics Concern  . Not on file  Social History Narrative  . Not on file     Family History: The patient's family history includes Cancer in her brother; Heart attack in her mother and sister.  ROS:   Please see the history of present illness.    All other systems reviewed and are negative.  EKGs/Labs/Other Studies Reviewed:    The following studies were reviewed today: Sinus rhythm, left ventricular hypertrophy with strain pattern   Recent Labs: No results found for requested labs within last 8760 hours.  Recent Lipid Panel No results found for: CHOL, TRIG, HDL, CHOLHDL, VLDL, LDLCALC, LDLDIRECT  Physical Exam:    VS:  BP 120/68 (BP Location: Left Arm, Patient Position: Sitting, Cuff Size: Normal)   Pulse 60   Ht 5\' 3"  (1.6 m)   Wt 159 lb 12.8 oz (72.5 kg)   SpO2 98%   BMI 28.31 kg/m     Wt Readings from Last 3 Encounters:  12/24/17 159 lb 12.8 oz (72.5 kg)  10/22/17 166 lb (75.3 kg)  07/07/17 166 lb 12.8 oz (75.7 kg)     GEN: Patient is in no acute distress HEENT: Normal NECK: No JVD; No carotid bruits LYMPHATICS: No lymphadenopathy CARDIAC: Hear sounds regular, 2/6 systolic murmur at the apex. RESPIRATORY:  Clear to auscultation without rales, wheezing or rhonchi  ABDOMEN: Soft, non-tender, non-distended MUSCULOSKELETAL:  No edema; No deformity  SKIN: Warm and dry NEUROLOGIC:  Alert and oriented x 3 PSYCHIATRIC:  Normal affect    Signed, Jenean Lindau, MD  12/24/2017 2:10 PM    [facility]

## 2017-12-24 NOTE — Patient Instructions (Signed)
Medication Instructions:  Your physician has recommended you make the following change in your medication:  START Nitroglycerin 0.4 mg sublingual (under your tongue) as needed for chest pain. If experiencing chest pain, stop what you are doing and sit down. Take 1 nitroglycerin and wait 5 minutes. If chest pain continues, take another nitroglycerin and wait 5 minutes. If chest pain does not subside, take 1 more nitroglycerin and dial 911. You make take a total of 3 nitroglycerin in a 15 minute time frame.  Labwork: None  Testing/Procedures:   [facility] [address] [employer] [place] [address] Dept: [phone] Loc: [place]  12/24/2017  You are scheduled for a Cardiac Catheterization on Monday, April 22 with Dr. Daneen Schick.  1. Please arrive at the [facility] (Main Entrance A) at [facility]: [address] [place], [place] [address] at 5:30 AM (two hours before your procedure to ensure your preparation). Free valet parking service is available.   Special note: Every effort is made to have your procedure done on time. Please understand that emergencies sometimes delay scheduled procedures.  2. Diet: Do not eat or drink anything after midnight prior to your procedure except sips of water to take medications.  3. Labs: None needed.  4. Medication instructions in preparation for your procedure:  Hold lasix and hydrochlorothiazide the morning of the heart cath.  On the morning of your procedure, take your Aspirin and any morning medicines NOT listed above.  You may use sips of water.  5. Plan for one night stay--bring personal belongings. 6. Bring a current list of your medications and current insurance cards. 7. You MUST have a responsible person to drive you home. 8. Someone MUST be with you the first 24 hours after you arrive home or your discharge will be  delayed. 9. Please wear clothes that are easy to get on and off and wear slip-on shoes.  Thank you for allowing [place] to care for you!   -- [facility]  Follow-Up: Your physician recommends that you schedule a follow-up appointment in: 1 month  Any Other Special Instructions Will Be Listed Below (If Applicable).     If you need a refill on your cardiac medications before your next appointment, please call your pharmacy.   [place], RN, BSN    Coronary Angiogram With Stent Coronary angiogram with stent placement is a procedure to widen or open a narrow blood vessel of the heart (coronary artery). Arteries may become blocked by cholesterol buildup (plaques) in the lining of the wall. When a coronary artery becomes partially blocked, blood flow to that area decreases. This may lead to chest pain or a heart attack (myocardial infarction). A stent is a small piece of metal that looks like mesh or a spring. Stent placement may be done as treatment for a heart attack or right after a coronary angiogram in which a blocked artery is found. Let your health care provider know about:  Any allergies you have.  All medicines you are taking, including vitamins, herbs, eye drops, creams, and over-the-counter medicines.  Any problems you or family members have had with anesthetic medicines.  Any blood disorders you have.  Any surgeries you have had.  Any medical conditions you have.  Whether you are pregnant or may be pregnant. What are the risks? Generally, this is a safe procedure. However,  problems may occur, including:  Damage to the heart or its blood vessels.  A return of blockage.  Bleeding, infection, or bruising at the insertion site.  A collection of blood under the skin (hematoma) at the insertion site.  A blood clot in another part of the body.  Kidney injury.  Allergic reaction to the dye or contrast that is  used.  Bleeding into the abdomen (retroperitoneal bleeding).  What happens before the procedure? Staying hydrated Follow instructions from your health care provider about hydration, which may include:  Up to 2 hours before the procedure - you may continue to drink clear liquids, such as water, clear fruit juice, black coffee, and plain tea.  Eating and drinking restrictions Follow instructions from your health care provider about eating and drinking, which may include:  8 hours before the procedure - stop eating heavy meals or foods such as meat, fried foods, or fatty foods.  6 hours before the procedure - stop eating light meals or foods, such as toast or cereal.  2 hours before the procedure - stop drinking clear liquids.  Ask your health care provider about:  Changing or stopping your regular medicines. This is especially important if you are taking diabetes medicines or blood thinners.  Taking medicines such as ibuprofen. These medicines can thin your blood. Do not take these medicines before your procedure if your health care provider instructs you not to. Generally, aspirin is recommended before a procedure of passing a small, thin tube (catheter) through a blood vessel and into the heart (cardiac catheterization).  What happens during the procedure?  An IV tube will be inserted into one of your veins.  You will be given one or more of the following: ? A medicine to help you relax (sedative). ? A medicine to numb the area where the catheter will be inserted into an artery (local anesthetic).  To reduce your risk of infection: ? Your health care team will wash or sanitize their hands. ? Your skin will be washed with soap. ? Hair may be removed from the area where the catheter will be inserted.  Using a guide wire, the catheter will be inserted into an artery. The location may be in your groin, in your wrist, or in the fold of your arm (near your elbow).  A type of X-ray  (fluoroscopy) will be used to help guide the catheter to the opening of the arteries in the heart.  A dye will be injected into the catheter, and X-rays will be taken. The dye will help to show where any narrowing or blockages are located in the arteries.  A tiny wire will be guided to the blocked spot, and a balloon will be inflated to make the artery wider.  The stent will be expanded and will crush the plaques into the wall of the vessel. The stent will hold the area open and improve the blood flow. Most stents have a drug coating to reduce the risk of the stent narrowing over time.  The artery may be made wider using a drill, laser, or other tools to remove plaques.  When the blood flow is better, the catheter will be removed. The lining of the artery will grow over the stent, which stays where it was placed. This procedure may vary among health care providers and hospitals. What happens after the procedure?  If the procedure is done through the leg, you will be kept in bed lying flat for about 6 hours. You will  be instructed to not bend and not cross your legs.  The insertion site will be checked frequently.  The pulse in your foot or wrist will be checked frequently.  You may have additional blood tests, X-rays, and a test that records the electrical activity of your heart (electrocardiogram, or ECG). This information is not intended to replace advice given to you by your health care provider. Make sure you discuss any questions you have with your health care provider. Document Released: 03/01/2003 Document Revised: 04/24/2016 Document Reviewed: 03/30/2016 Elsevier Interactive Patient Education  [employer].

## 2017-12-25 ENCOUNTER — Telehealth: Payer: Self-pay | Admitting: Pharmacist

## 2017-12-25 ENCOUNTER — Other Ambulatory Visit: Payer: Self-pay | Admitting: *Deleted

## 2017-12-25 NOTE — Patient Outreach (Signed)
[facility]) Care Management  12/25/2017  Sheila [name] 1940/11/16 [id number]   77 y.o. year old female referred to [place] for Medication Management (Pharmacy telephone outreach 1)   Was unable to reach patient via telephone today and have left HIPAA compliant voicemail asking patient to return my call (unsuccessful outreach #1).  Plan: Will followup within 3 business days via telephone Will send 10 day unsuccessful outreach letter and allow patient 10 days to respond prior to closing case.    Carlean Jews, Pharm.D., BCPS PGY2 Ambulatory Care Pharmacy Resident Phone: [phone]

## 2017-12-25 NOTE — Patient Outreach (Signed)
[facility]) Care Management  12/25/2017  Sheila [name] 13-Aug-1941 [id number]   RN Health Coach Introduction Call  Referral Date:  12/17/2017 Referral Source:  [place] Reason for Referral:  Medication Assistance/Disease Management Education Insurance:  [employer]   Outreach Attempt:  Successful telephone outreach to patient for introduction call.  HIPAA verified with patient.  RN Health Coach introduced self and role.  Patient verbally agrees to monthly telephone outreaches.  Patient confirms she is scheduled for Cardiac Catheterization on Monday, April 22.  RN Health Coach encouraged patient to return call to [place] to help with medication reconciliation and medication assistance.  Patient stated she would return telephone call.  Plan:  RN Health Coach will make telephone outreach to patient within the next 10 business days to complete initial telephone assessment.   [place] [phone] [name]

## 2017-12-28 ENCOUNTER — Encounter (HOSPITAL_COMMUNITY): Payer: Self-pay | Admitting: Interventional Cardiology

## 2017-12-28 ENCOUNTER — Encounter (HOSPITAL_COMMUNITY)
Admission: RE | Disposition: A | Discharge status: Home / Self Care | Payer: Self-pay | Source: Ambulatory Visit | Attending: Interventional Cardiology

## 2017-12-28 ENCOUNTER — Ambulatory Visit (HOSPITAL_COMMUNITY)
Admission: RE | Admit: 2017-12-28 | Discharge: 2017-12-28 | Disposition: A | Discharge status: Home / Self Care | Payer: PPO | Source: Ambulatory Visit | Attending: Interventional Cardiology | Admitting: Interventional Cardiology

## 2017-12-28 DIAGNOSIS — R079 Chest pain, unspecified: Secondary | ICD-10-CM | POA: Diagnosis not present

## 2017-12-28 DIAGNOSIS — I209 Angina pectoris, unspecified: Secondary | ICD-10-CM

## 2017-12-28 DIAGNOSIS — Z9889 Other specified postprocedural states: Secondary | ICD-10-CM | POA: Diagnosis not present

## 2017-12-28 DIAGNOSIS — Z8249 Family history of ischemic heart disease and other diseases of the circulatory system: Secondary | ICD-10-CM | POA: Insufficient documentation

## 2017-12-28 DIAGNOSIS — E785 Hyperlipidemia, unspecified: Secondary | ICD-10-CM | POA: Diagnosis not present

## 2017-12-28 DIAGNOSIS — I2510 Atherosclerotic heart disease of native coronary artery without angina pectoris: Secondary | ICD-10-CM

## 2017-12-28 DIAGNOSIS — Z888 Allergy status to other drugs, medicaments and biological substances status: Secondary | ICD-10-CM | POA: Insufficient documentation

## 2017-12-28 DIAGNOSIS — Z881 Allergy status to other antibiotic agents status: Secondary | ICD-10-CM | POA: Diagnosis not present

## 2017-12-28 DIAGNOSIS — I25119 Atherosclerotic heart disease of native coronary artery with unspecified angina pectoris: Secondary | ICD-10-CM | POA: Diagnosis not present

## 2017-12-28 DIAGNOSIS — I10 Essential (primary) hypertension: Secondary | ICD-10-CM | POA: Diagnosis not present

## 2017-12-28 HISTORY — PX: LEFT HEART CATH AND CORONARY ANGIOGRAPHY: CATH118249

## 2017-12-28 LAB — POCT I-STAT, CHEM 8
BUN: 15 mg/dL (ref 6–20)
CALCIUM ION: 1.26 mmol/L (ref 1.15–1.40)
Chloride: 101 mmol/L (ref 101–111)
Creatinine, Ser: 0.7 mg/dL (ref 0.44–1.00)
GLUCOSE: 103 mg/dL — AB (ref 65–99)
HCT: 39 % (ref 36.0–46.0)
HEMOGLOBIN: 13.3 g/dL (ref 12.0–15.0)
Potassium: 3.5 mmol/L (ref 3.5–5.1)
Sodium: 141 mmol/L (ref 135–145)
TCO2: 28 mmol/L (ref 22–32)

## 2017-12-28 SURGERY — LEFT HEART CATH AND CORONARY ANGIOGRAPHY
Anesthesia: LOCAL

## 2017-12-28 MED ORDER — SODIUM CHLORIDE 0.9% FLUSH: 3.0000 mL | INTRAVENOUS | Status: DC | PRN

## 2017-12-28 MED ORDER — SODIUM CHLORIDE 0.9 % WEIGHT BASED INFUSION: 1.0000 mL/kg/h | INTRAVENOUS | Status: DC

## 2017-12-28 MED ORDER — HEPARIN SODIUM (PORCINE) 1000 UNIT/ML IJ SOLN
INTRAMUSCULAR | Status: DC | PRN
  Administered 2017-12-28: 4000 [IU] via INTRAVENOUS

## 2017-12-28 MED ORDER — SODIUM CHLORIDE 0.9 % IV SOLN: INTRAVENOUS | Status: DC

## 2017-12-28 MED ORDER — FENTANYL CITRATE (PF) 100 MCG/2ML IJ SOLN
INTRAMUSCULAR | Status: DC | PRN
  Administered 2017-12-28: 50 ug via INTRAVENOUS

## 2017-12-28 MED ORDER — SODIUM CHLORIDE 0.9% FLUSH: 3.0000 mL | Freq: Two times a day (BID) | INTRAVENOUS | Status: DC

## 2017-12-28 MED ORDER — LIDOCAINE HCL (PF) 1 % IJ SOLN
INTRAMUSCULAR | Status: DC | PRN
  Administered 2017-12-28: 2 mL

## 2017-12-28 MED ORDER — ACETAMINOPHEN 325 MG PO TABS: 650.0000 mg | ORAL_TABLET | ORAL | Status: DC | PRN

## 2017-12-28 MED ORDER — FENTANYL CITRATE (PF) 100 MCG/2ML IJ SOLN
INTRAMUSCULAR | Status: AC
  Filled 2017-12-28: qty 2

## 2017-12-28 MED ORDER — SODIUM CHLORIDE 0.9 % WEIGHT BASED INFUSION
3.0000 mL/kg/h | INTRAVENOUS | Status: DC
  Administered 2017-12-28: 3 mL/kg/h via INTRAVENOUS

## 2017-12-28 MED ORDER — ASPIRIN 81 MG PO CHEW: 81.0000 mg | CHEWABLE_TABLET | ORAL | Status: DC

## 2017-12-28 MED ORDER — HEPARIN SODIUM (PORCINE) 1000 UNIT/ML IJ SOLN
INTRAMUSCULAR | Status: AC
  Filled 2017-12-28: qty 1

## 2017-12-28 MED ORDER — SODIUM CHLORIDE 0.9 % IV SOLN: 250.0000 mL | INTRAVENOUS | Status: DC | PRN

## 2017-12-28 MED ORDER — ONDANSETRON HCL 4 MG/2ML IJ SOLN: 4.0000 mg | Freq: Four times a day (QID) | INTRAMUSCULAR | Status: DC | PRN

## 2017-12-28 MED ORDER — VERAPAMIL HCL 2.5 MG/ML IV SOLN
INTRAVENOUS | Status: AC
  Filled 2017-12-28: qty 2

## 2017-12-28 MED ORDER — HEPARIN (PORCINE) IN NACL 2-0.9 UNITS/ML
INTRAMUSCULAR | Status: AC | PRN
  Administered 2017-12-28 (×2): 500 mL

## 2017-12-28 MED ORDER — MIDAZOLAM HCL 2 MG/2ML IJ SOLN
INTRAMUSCULAR | Status: DC | PRN
  Administered 2017-12-28 (×2): 1 mg via INTRAVENOUS

## 2017-12-28 MED ORDER — LIDOCAINE HCL (PF) 1 % IJ SOLN
INTRAMUSCULAR | Status: AC
  Filled 2017-12-28: qty 30

## 2017-12-28 MED ORDER — VERAPAMIL HCL 2.5 MG/ML IV SOLN
INTRAVENOUS | Status: DC | PRN
  Administered 2017-12-28: 10 mL via INTRA_ARTERIAL

## 2017-12-28 MED ORDER — IOHEXOL 350 MG/ML SOLN
INTRAVENOUS | Status: DC | PRN
  Administered 2017-12-28: 75 mL

## 2017-12-28 MED ORDER — HEPARIN (PORCINE) IN NACL 1000-0.9 UT/500ML-% IV SOLN
INTRAVENOUS | Status: AC
  Filled 2017-12-28: qty 1000

## 2017-12-28 MED ORDER — MIDAZOLAM HCL 2 MG/2ML IJ SOLN
INTRAMUSCULAR | Status: AC
  Filled 2017-12-28: qty 2

## 2017-12-28 SURGICAL SUPPLY — 11 items
CATH INFINITI 5 FR JL3.5 (CATHETERS) ×2 IMPLANT
CATH INFINITI JR4 5F (CATHETERS) ×2 IMPLANT
DEVICE RAD COMP TR BAND LRG (VASCULAR PRODUCTS) ×2 IMPLANT
GLIDESHEATH SLEND A-KIT 6F 22G (SHEATH) ×2 IMPLANT
GUIDEWIRE INQWIRE 1.5J.035X260 (WIRE) ×1 IMPLANT
INQWIRE 1.5J .035X260CM (WIRE) ×2
KIT HEART LEFT (KITS) ×2 IMPLANT
PACK CARDIAC CATHETERIZATION (CUSTOM PROCEDURE TRAY) ×2 IMPLANT
TRANSDUCER W/STOPCOCK (MISCELLANEOUS) ×2 IMPLANT
TUBING CIL FLEX 10 FLL-RA (TUBING) ×2 IMPLANT
WIRE HI TORQ VERSACORE-J 145CM (WIRE) ×2 IMPLANT

## 2017-12-28 NOTE — H&P (Signed)
Cath Lab Visit (complete for each Cath Lab visit)  Clinical Evaluation Leading to the Procedure:   ACS: No.  Non-ACS:    Anginal Classification: CCS IV  Anti-ischemic medical therapy: Minimal Therapy (1 class of medications)  Non-Invasive Test Results: No non-invasive testing performed  Prior CABG: No previous CABG    The history and physical performed by Dr. Geraldo Pitter were reviewed in detail.  No significant change in cardiac symptoms since completion of the exam.  The procedure and risks were discussed with the patient in detail.  Overall, the continuous nature of the discomfort seems to suggest something other than obstructive coronary disease as etiology.

## 2017-12-28 NOTE — Discharge Instructions (Signed)

## 2017-12-29 ENCOUNTER — Telehealth: Payer: Self-pay | Admitting: Pharmacist

## 2017-12-29 MED FILL — Heparin Sod (Porcine)-NaCl IV Soln 1000 Unit/500ML-0.9%: INTRAVENOUS | Qty: 1000 | Status: AC

## 2017-12-29 NOTE — Patient Outreach (Signed)
[facility]) Care Management  [place]   12/29/2017  Sheila [name] Jan 01, 1941 [id number]  77 y.o. year old female referred to [place] for Medication Assistance and Medication Management (Pharmacy telephone outreach)  PMH s/f: CAD, HTN, GERD, HLD  Patient with [employer] medicare advantage plan.    Patient confirms identity with HIPAA-identifiers x2 and gives verbal consent to speak over the phone about medications.    SUBJECTIVE:   Medication Adherence: Patient reports adherence to medications as prescribed. Reports she was recently switched from omeprazole to pantoprazole. Reports her statin was changed to every-other-day dosing and she was recommended Co-Enzyme Q10 to help with muscle pains.  She also reports she was given permission by her cardiologist to skip one BP medication each day except clonidine (Cannot find documentation of this recommendation via chart review). Also reports Ranexa was d/c'd after cardiac cath on 12/28/2017.   Medication Assistance:  Denies issues now that Ranexa d/c'd   Medication Management:  Reports muscle pains have improved since changing statin and adding Co-Enzyme Q10 to regimen.   OBJECTIVE: Encounter Medications: Outpatient Encounter Medications as of 12/29/2017  Medication Sig Note  . acetaminophen (TYLENOL) 500 MG tablet Take 500 mg by mouth every 6 (six) hours as needed.   . ALPRAZolam (XANAX) 0.25 MG tablet Take 0.25 mg by mouth 2 (two) times daily as needed for anxiety. 12/29/2017: Only takes for procedures  . aspirin EC 81 MG tablet Take 81 mg by mouth daily.   Marland Kitchen atorvastatin (LIPITOR) 20 MG tablet Take 20 mg by mouth every other day.    . brimonidine (ALPHAGAN) 0.15 % ophthalmic solution Place 1 drop into both eyes every 12 (twelve) hours.    . carvedilol (COREG) 6.25 MG tablet Take 6.25 mg by mouth 2 (two) times daily with a meal.   . cloNIDine (CATAPRES) 0.1 MG tablet Take 0.1 mg by mouth 2  (two) times daily.   . Coenzyme Q10 (CO Q-10) 200 MG CAPS Take 1 tablet by mouth every other day.   . dorzolamide (TRUSOPT) 2 % ophthalmic solution Place 1 drop into both eyes daily.    . furosemide (LASIX) 40 MG tablet Take 40 mg by mouth daily as needed.   . hydrochlorothiazide (HYDRODIURIL) 12.5 MG tablet Take 0.5 mg by mouth daily.    Marland Kitchen lisinopril (PRINIVIL,ZESTRIL) 10 MG tablet Take 10 mg by mouth 2 (two) times daily.   . meclizine (ANTIVERT) 25 MG tablet Take 25 mg by mouth 3 (three) times daily as needed for dizziness.   . nitroGLYCERIN (NITROSTAT) 0.4 MG SL tablet Place 1 tablet (0.4 mg total) under the tongue every 5 (five) minutes as needed for chest pain.   . pantoprazole (PROTONIX) 20 MG tablet Take 20 mg by mouth daily.   . sodium chloride (MURO 128) 2 % ophthalmic solution Place 1 drop into both eyes at bedtime.   . sucralfate (CARAFATE) 1 g tablet Take 1 g by mouth 4 (four) times daily -  with meals and at bedtime.    . timolol (BETIMOL) 0.5 % ophthalmic solution Place 1 drop into both eyes 2 (two) times daily.    . [DISCONTINUED] omeprazole (PRILOSEC) 20 MG capsule Take 20 mg by mouth daily.    No facility-administered encounter medications on file as of 12/29/2017.     Functional Status: In your present state of health, do you have any difficulty performing the following activities: 03/24/2017  Hearing? Y  Vision? Y  Comment does  not see well at night  Difficulty concentrating or making decisions? Y  Walking or climbing stairs? N  Dressing or bathing? N  Doing errands, shopping? N  Preparing Food and eating ? N  Using the Toilet? N  In the past six months, have you accidently leaked urine? N  Do you have problems with loss of bowel control? N  Managing your Medications? Y  Comment reports medications are expensive  Managing your Finances? N  Housekeeping or managing your Housekeeping? N  Some recent data might be hidden    Fall/Depression Screening: Fall Risk   03/24/2017  Falls in the past year? Yes  Number falls in past yr: 1  Injury with Fall? No  Risk Factor Category  High Fall Risk  Risk for fall due to : Impaired balance/gait  Follow up Falls evaluation completed;Falls prevention discussed   PHQ 2/9 Scores 12/17/2017 03/24/2017  PHQ - 2 Score 1 2  PHQ- 9 Score - 5     Drugs sorted by system:  Neurologic/Psychologic:xanax (procedures only per patient), meclizine (dizziness)  Cardiovascular:aspirin, atorvastatin, carvedilol, clonidine, HCTZ, lisinopril, lasix, SL nitroglycerin  Gastrointestinal:pantoprazole, sucralfate  Vitamins/Minerals:Co-Enzyme Q10  Miscellaneous:aphagan, trusopt, sodium chloride eye drops, timolol  Issues noted: medication adherence to BP medications  PLAN:  -Will send inbasket message to Dr. Geraldo Pitter asking for clarification on BP medications -Counseled on important of adherence to medications -Counseled on expiration date of SL nitroglycerin -Encouraged regular follow up with doctors  Will follow up with Dr. Geraldo Pitter and patient as needed. Otherwise, pharmacy signing off.  Carlean Jews, Pharm.D., BCPS PGY2 Ambulatory Care Pharmacy Resident Phone: [phone]

## 2017-12-30 ENCOUNTER — Telehealth: Payer: Self-pay

## 2017-12-30 ENCOUNTER — Other Ambulatory Visit: Payer: Self-pay | Admitting: *Deleted

## 2017-12-30 ENCOUNTER — Encounter: Payer: Self-pay | Admitting: *Deleted

## 2017-12-30 NOTE — Patient Outreach (Signed)
[facility]) Care Management  [place]  12/30/2017   TOPACIO CELLA 29-Jan-1941 [id number]   RN Health Coach Initial Assessment  Referral Date:  4/11/219 Referral Source:  [facility] Reason for Referral:  Medication Assistance/Disease Management Education Insurance:  Health Team Advantage   Outreach Attempt:  Successful telephone outreach to patient for initial telephone assessment.  HIPAA verified with patient.  Patient completed initial telephone assessment.  Social:  Patient lives at home with son.  Reports being independent with ADLs and IADLs.  States all her children work full time and have limited availability for caregiver resources.  Does verbalize assistance from friends and church members with transportation to her medical appointments, if she is not able to drive herself. Denies any falls in the last year and ambulates independently without assistive devices.  DME in the home include:  Bilateral hearing aides, glasses, partial upper an lower dentures, blood pressure cuff, straight cane, and rolling walker.  Conditions:  Per chart review and discussion with patient, PMH include:  Anxiety, arthritis, asthma, bilateral cataracts removed, depression, GERD, glaucoma, heart murmur, hyperlipidemia, hypertension, bilateral shoulder rotator cuff repairs, coronary artery disease, cerebral spinal fluid leak brain surgery.  Patient reports latest medical issues have been around chest pain versus reflux/abdominal pain.  She has seen her cardiologist and underwent Cardiac Catheterization on 12/28/2017, with results being medically managing disease.  Per patient her medications have been adjusted since her catheterization.  Right radial cath site a little tender per patient.  She is unable to assess site at this time as she reports she was told to keep bandage on site for 48 hours and will remove tomorrow morning.  Reviewed with patient right radial site precautions of lifting  restrictions and not submerging site under water.  Patient stated her understanding and stated she has been reviewing her handouts on her discharge instructions.  Verbalizes her next step is to undergo GI evaluation and is scheduled for ultrasound of her abdomen to rule out the symptoms are related to her gall bladder.  Patient also states her blood pressures have been well controlled.  Reports one elevated blood pressure of 145/76 in the past 2 weeks.  States her primary care provider gave her the goal of SBP <130.  Verbalizes her primary care provider instructed her to start weaning herself off some of her blood pressure medications one at a time daily, so currently she is monitoring her blood pressure daily.  Encouraged patient to continue process as instructed and if she has questions or blood pressure elevations to seek the assistance of her primary care provider.  Medications:  Patient reports taking about 13 medications including eye drops a day.  States the medication she was having trouble affording has been discontinued post catheterization.  Reports managing her medications herself without difficulties.   Encounter Medications:  Outpatient Encounter Medications as of 12/30/2017  Medication Sig Note  . acetaminophen (TYLENOL) 500 MG tablet Take 500 mg by mouth every 6 (six) hours as needed.   . ALPRAZolam (XANAX) 0.25 MG tablet Take 0.25 mg by mouth 2 (two) times daily as needed for anxiety. 12/29/2017: Only takes for procedures  . aspirin EC 81 MG tablet Take 81 mg by mouth daily.   Marland Kitchen atorvastatin (LIPITOR) 20 MG tablet Take 20 mg by mouth every other day.    . brimonidine (ALPHAGAN) 0.15 % ophthalmic solution Place 1 drop into both eyes every 12 (twelve) hours.    . carvedilol (COREG) 6.25 MG  tablet Take 6.25 mg by mouth 2 (two) times daily with a meal.   . cloNIDine (CATAPRES) 0.1 MG tablet Take 0.1 mg by mouth 2 (two) times daily.   . Coenzyme Q10 (CO Q-10) 200 MG CAPS Take 1 tablet by  mouth every other day.   . dorzolamide (TRUSOPT) 2 % ophthalmic solution Place 1 drop into both eyes daily.    . furosemide (LASIX) 40 MG tablet Take 40 mg by mouth daily as needed.   Marland Kitchen lisinopril (PRINIVIL,ZESTRIL) 10 MG tablet Take 10 mg by mouth 2 (two) times daily.   . meclizine (ANTIVERT) 25 MG tablet Take 25 mg by mouth 3 (three) times daily as needed for dizziness.   . nitroGLYCERIN (NITROSTAT) 0.4 MG SL tablet Place 1 tablet (0.4 mg total) under the tongue every 5 (five) minutes as needed for chest pain.   . pantoprazole (PROTONIX) 20 MG tablet Take 20 mg by mouth daily.   . sodium chloride (MURO 128) 2 % ophthalmic solution Place 1 drop into both eyes at bedtime. 12/30/2017: Taking as needed per patient  . sucralfate (CARAFATE) 1 g tablet Take 1 g by mouth 4 (four) times daily -  with meals and at bedtime.    . timolol (BETIMOL) 0.5 % ophthalmic solution Place 1 drop into both eyes 2 (two) times daily.    . hydrochlorothiazide (HYDRODIURIL) 12.5 MG tablet Take 0.5 mg by mouth daily.  12/30/2017: Reports taking every other day per PCP recommendations   No facility-administered encounter medications on file as of 12/30/2017.     Functional Status:  In your present state of health, do you have any difficulty performing the following activities: 12/30/2017 03/24/2017  Hearing? Y Y  Comment bilateral hearing aides -  Vision? N Y  Comment - does not see well at night  Difficulty concentrating or making decisions? N Y  Walking or climbing stairs? N N  Dressing or bathing? N N  Doing errands, shopping? N N  Preparing Food and eating ? N N  Using the Toilet? N N  In the past six months, have you accidently leaked urine? N N  Do you have problems with loss of bowel control? N N  Managing your Medications? N Y  Comment - reports medications are expensive  Managing your Finances? N N  Housekeeping or managing your Housekeeping? N N  Some recent data might be hidden    Fall/Depression  Screening: Fall Risk  12/30/2017 03/24/2017  Falls in the past year? No Yes  Number falls in past yr: - 1  Injury with Fall? - No  Risk Factor Category  - High Fall Risk  Risk for fall due to : Impaired vision Impaired balance/gait  Follow up - Falls evaluation completed;Falls prevention discussed   PHQ 2/9 Scores 12/30/2017 12/17/2017 03/24/2017  PHQ - 2 Score 1 1 2   PHQ- 9 Score - - 5   THN CM Care Plan Problem One     Most Recent Value  Care Plan Problem One  Assistance with managing blood pressure control and weight loss.  Role Documenting the Problem One  [place] for Problem One  Active  [facility] Long Term Goal   Patient will report a weight loss of 3 pounds in the next 90 days.  THN Long Term Goal Start Date  12/30/17  Interventions for Problem One Long Term Goal  Current care plan and goals reviewed and discussed with the patient, encouraged to increase acitvity when physician  allows, encouraged to continue plans to join gym when physician allows, discussed healthier meals options, discussed limiting sodium intake, falls prevention discussed and reviewed, encouraged to keep and attend medical appointments, encouraged to attend ultrasound appointment 12/31/2017  [facility] CM Short Term Goal #1   Patient will report monitoring blood pressures daily for the next 30 days.  THN CM Short Term Goal #1 Start Date  12/30/17  Interventions for Short Term Goal #1  Sending 2019 Calendar Booklet to help track medical appointments and record blood pressure readings, enouraged patient to monitor blood pressures daily while weaning off blood pressure medications as she discussed with her primary care provider, encouraged patient to stick with medication adjustments as discussed and provided by her pirmary care provider, sending patient Hypertension Educational Book  [facility] CM Short Term Goal #2   Patient will report outside walking at least 3 days a week in the next 30 days.  THN CM Short Term Goal #2  Start Date  12/30/17  Interventions for Short Term Goal #2  Encouraged patient to incease activity when physician allows, discussed falls preventions and precautions related to lightheadiness, encouraged patient to start off slowly, discussed the benefits of exercise as relates to health and lowering blood pressure     Appointments:  Patient states she last saw her primary care provider, Dr. Nicki Reaper on 4/18/219 and has follow up appointment around May 6.  Cardiology follow up appointment with Dr. Basilio Cairo is scheduled for Jan 25, 2018.  Ultrasound of abdomen is scheduled for 4/25/219.  Patient encouraged to keep and attend medical appointments.  Advanced Directives:  Patient reports having a [place] in place and does not wish to make any changes to advance directive at this time.   Consent:  [facility] services reviewed and discussed with patient.  Verbally agrees to monthly telephone outreaches.  Has spoken with [facility] pharmacist for medication reconcilation and medication assistance.  Plan: RN Health Coach will make next monthly outreach to patient in the month of May. RN Health Coach will send primary MD barriers letter. RN Health Coach will route initial telephone assessment note to primary MD. [place] will send patient [place]. RN Health Coach will send patient Hypertension Educational Booklet. RN Health Coach will send patient 2019 Calendar Booklet   [place] [facility] Care Management  RN Health Coach [phone] Lorien [email or web address]

## 2017-12-30 NOTE — Telephone Encounter (Signed)
Spoke with patient regarding her medications. Patient stated that she held her HCTZ yesterday and lisinopril today. Educated patient regarding the process of elimination with her medications. She was informed that she should hold one medication at a time for one week. Patient was agreeable and stated she would do so.

## 2017-12-30 NOTE — Telephone Encounter (Signed)
-----   Message from Jenean Lindau, MD sent at 12/30/2017  3:50 PM EDT ----- Regarding: RE: Skipping BP meds My nurse Caryl Pina will get in touch with her.  Thank you for sending me this note.  She was advised to stop one medicine for a week at a time to see if any of these may  be giving her significant fatigue and side effects probably related to that medicine.  She was told that if this did not make any change to how she felt,... to restart that medicine.  Then she would go ahead and stop her next medicine for a week and so on until she completed the cycle of doing this with all her meds.  Hopefully this will help [place] to understand if any of her medicines are causing her any symptoms.  I told not to ever stop clonidine without [place] telling her for fear of rebound hypertension.  If you have any suggestions, kindly let me know Thank you ----- Message ----- From: Carlean Jews, [facility] Sent: 12/30/2017   3:33 PM To: Jenean Lindau, MD Subject: Skipping BP meds                               Hi Dr. Geraldo Pitter,   I work with Ms. Pe through [facility] as a pharmacist. She told me over the phone yesterday that she would like to get off all meds and she is skipping 1 blood pressure pill each day. Apparently, she alters which medication to skip but never skips the clonidine. She said that she discussed this with you but I couldn't find any evidence of this via chart review. I recommend you have someone from her office reach out to her if this is NOT what you intended for her to do.   Thanks!  Shela Commons, Pharm.D., BCPS PGY2 Ambulatory Care Pharmacy Resident Phone: [phone]

## 2017-12-31 DIAGNOSIS — R1013 Epigastric pain: Secondary | ICD-10-CM | POA: Diagnosis not present

## 2018-01-05 ENCOUNTER — Encounter: Payer: Self-pay | Admitting: Neurology

## 2018-01-06 DIAGNOSIS — R1013 Epigastric pain: Secondary | ICD-10-CM | POA: Diagnosis not present

## 2018-01-25 ENCOUNTER — Ambulatory Visit: Payer: PPO | Admitting: Cardiology

## 2018-01-26 ENCOUNTER — Ambulatory Visit: Payer: PPO | Admitting: Cardiology

## 2018-01-29 ENCOUNTER — Other Ambulatory Visit: Payer: Self-pay | Admitting: *Deleted

## 2018-01-29 ENCOUNTER — Encounter: Payer: Self-pay | Admitting: *Deleted

## 2018-01-29 NOTE — Patient Outreach (Signed)
[place] [facility]) Care Management  01/29/2018  Sheila [name] 02/07/1941 [id number]   RN Health Coach Monthly Outreach  Referral Date:  4/11/219 Referral Source:  [place] Reason for Referral:  Medication Assistance/Disease Management Education Insurance:  Health Team Advantage   Outreach Attempt:  Received incoming return call from patient.  HIPAA verified with patient.  Patient states she is doing great.  Reports being excited about having a her first great grand baby last night.  Patient states her blood pressure was elevated this morning, 180/97, because she did not take her medications last night rushing out to the hospital to see the baby.  States she took her medications this morning and BP now 131/67.  Patient also reports no longer taking her Hydrochlorothiazide and Lisinopril.  Reports systolic blood pressure ranges of 120-130's.  Could not give exact readings due to not being at home where she has recorded readings.  Reports her "heart burn" pain is pretty much resolved, but wants to discuss with primary care provider about prescribing Protonix instead of Prilosec.  States the Prilosec bloats her.  Reports the Ultrasound of Abdomen was negative for any issues.  Appointments:  Patient reports seeing Dr. Nicki Reaper on 01/11/2018 and states she needs to schedule a follow up appointment.  Has scheduled Cardiology appointment on 02/04/2018.  Encouraged patient to schedule follow up appointment with Dr. Nicki Reaper to discuss prescription for Protonix and to discuss her blood pressure medications with Dr. Basilio Cairo at her upcoming Cardiology appointment.  Plan: RN Health Coach will make next monthly outreach to patient in the month of June.  [place] [phone] Sheila [name]

## 2018-01-29 NOTE — Patient Outreach (Signed)
[place] [facility]) Care Management  01/29/2018  Sheila [name] [birth year] [id number]   RN Health Coach Monthly Outreach  Referral Date:  4/11/219 Referral Source:  [facility] Reason for Referral:  Medication Assistance/Disease Management Education Insurance:  Health Team Advantage   Outreach Attempt: Outreach attempt #1 to patient for monthly follow up. No answer. RN Health Coach left HIPAA compliant voicemail message along with contact information.   Plan:  RN Health Coach will send unsuccessful outreach letter to patient.  RN Health Coach will make another outreach attempt to patient within 3-4 business days if no return call back from patient.   [place] [phone] [name]

## 2018-02-03 ENCOUNTER — Ambulatory Visit: Payer: Self-pay | Admitting: *Deleted

## 2018-02-04 ENCOUNTER — Encounter: Payer: Self-pay | Admitting: Cardiology

## 2018-02-04 ENCOUNTER — Ambulatory Visit (INDEPENDENT_AMBULATORY_CARE_PROVIDER_SITE_OTHER): Payer: PPO | Admitting: Cardiology

## 2018-02-04 VITALS — BP 136/74 | HR 72 | Ht 63.0 in | Wt 158.0 lb

## 2018-02-04 DIAGNOSIS — I10 Essential (primary) hypertension: Secondary | ICD-10-CM | POA: Diagnosis not present

## 2018-02-04 DIAGNOSIS — I2510 Atherosclerotic heart disease of native coronary artery without angina pectoris: Secondary | ICD-10-CM | POA: Diagnosis not present

## 2018-02-04 NOTE — Progress Notes (Signed)
Cardiology Office Note:    Date:  02/04/2018   ID:  PARRISH DADDARIO, DOB [birth year], MRN [id number]  PCP:  Myer Peer, MD  Cardiologist:  Jenean Lindau, MD   Referring MD: Myer Peer, MD    ASSESSMENT:    1. Coronary artery disease involving native coronary artery of native heart without angina pectoris   2. Essential hypertension    PLAN:    In order of problems listed above:  1. Secondary prevention stressed with the patient.  Importance of compliance with diet and medications stressed and she vocalized understanding.  Coronary angiography report was discussed with the patient at length.  Her blood pressure is stable. 2. Discussed for dyslipidemia.  She will have a Chem-7 today.  Her lipids are followed by her primary care physician with whom she has an appointment in the month of June. 3. Patient will be seen in follow-up appointment in 6 months or earlier if the patient has any concerns    Medication Adjustments/Labs and Tests Ordered: Current medicines are reviewed at length with the patient today.  Concerns regarding medicines are outlined above.  Orders Placed This Encounter  Procedures  . Basic metabolic panel   No orders of the defined types were placed in this encounter.    No chief complaint on file.    History of Present Illness:    Sheila [name] is a 77 y.o. female the patient has history of essential hypertension and nonobstructive coronary artery disease.  She denies any problems at this time and takes care of activities of daily living.  No chest pain orthopnea or PND.  Some of her medications including Ranexa but discontinued after the coronary angiography.  She is here for routine follow-up.  At the time of my evaluation, the patient is alert awake oriented and in no distress.  Past Medical History:  Diagnosis Date  . Allergy   . Anxiety   . Arthritis   . Asthma   . Blood transfusion without reported diagnosis   . Cataract   .  Depression   . GERD (gastroesophageal reflux disease)   . Glaucoma   . Heart murmur   . Hyperlipidemia   . Hypertension     Past Surgical History:  Procedure Laterality Date  . CESAREAN SECTION    . EYE SURGERY    . LEFT HEART CATH AND CORONARY ANGIOGRAPHY N/A 12/28/2017   Procedure: LEFT HEART CATH AND CORONARY ANGIOGRAPHY;  Surgeon: Belva Crome, MD;  Location: [place] [facility];  Service: Cardiovascular;  Laterality: N/A;  . MASTOIDECTOMY    . PATENT DUCTUS ARTERIOUS REPAIR    . SHOULDER OPEN ROTATOR CUFF REPAIR Bilateral     Current Medications: Current Meds  Medication Sig  . acetaminophen (TYLENOL) 500 MG tablet Take 500 mg by mouth every 6 (six) hours as needed.  . ALPRAZolam (XANAX) 0.25 MG tablet Take 0.25 mg by mouth 2 (two) times daily as needed for anxiety.  Marland Kitchen aspirin EC 81 MG tablet Take 81 mg by mouth daily.  Marland Kitchen atorvastatin (LIPITOR) 20 MG tablet Take 20 mg by mouth every other day.   . brimonidine (ALPHAGAN) 0.15 % ophthalmic solution Place 1 drop into both eyes every 12 (twelve) hours.   . carvedilol (COREG) 6.25 MG tablet Take 6.25 mg by mouth 2 (two) times daily with a meal.  . cloNIDine (CATAPRES) 0.1 MG tablet Take 0.1 mg by mouth 2 (two) times daily.  . Coenzyme Q10 (CO Q-10)  200 MG CAPS Take 1 tablet by mouth every other day.  . dorzolamide (TRUSOPT) 2 % ophthalmic solution Place 1 drop into both eyes daily.   . furosemide (LASIX) 40 MG tablet Take 40 mg by mouth daily as needed.  . meclizine (ANTIVERT) 25 MG tablet Take 25 mg by mouth 3 (three) times daily as needed for dizziness.  . nitroGLYCERIN (NITROSTAT) 0.4 MG SL tablet Place 1 tablet (0.4 mg total) under the tongue every 5 (five) minutes as needed for chest pain.  . pantoprazole (PROTONIX) 20 MG tablet Take 20 mg by mouth daily.  . sodium chloride (MURO 128) 2 % ophthalmic solution Place 1 drop into both eyes at bedtime.  . sucralfate (CARAFATE) 1 g tablet Take 1 g by mouth 4 (four) times daily -   with meals and at bedtime.   . timolol (BETIMOL) 0.5 % ophthalmic solution Place 1 drop into both eyes 2 (two) times daily.      Allergies:   Antihistamines, chlorpheniramine-type; Ativan [lorazepam]; Ceftin [cefuroxime axetil]; Meloxicam; Pheneen [benzalkonium chloride]; and Morphine   Social History   Socioeconomic History  . Marital status: Divorced    Spouse name: Not on file  . Number of children: Not on file  . Years of education: Not on file  . Highest education level: Not on file  Occupational History  . Not on file  Social Needs  . Financial resource strain: Not on file  . Food insecurity:    Worry: Not on file    Inability: Not on file  . Transportation needs:    Medical: Not on file    Non-medical: Not on file  Tobacco Use  . Smoking status: Never Smoker  . Smokeless tobacco: Never Used  Substance and Sexual Activity  . Alcohol use: No  . Drug use: No  . Sexual activity: Not on file  Lifestyle  . Physical activity:    Days per week: Not on file    Minutes per session: Not on file  . Stress: Not on file  Relationships  . Social connections:    Talks on phone: Not on file    Gets together: Not on file    Attends religious service: Not on file    Active member of club or organization: Not on file    Attends meetings of clubs or organizations: Not on file    Relationship status: Not on file  Other Topics Concern  . Not on file  Social History Narrative  . Not on file     Family History: The patient's family history includes Cancer in her brother; Heart attack in her mother and sister.  ROS:   Please see the history of present illness.    All other systems reviewed and are negative.  EKGs/Labs/Other Studies Reviewed:    The following studies were reviewed today: I discussed my findings with the patient at extensive length.   Recent Labs: 12/28/2017: BUN 15; Creatinine, Ser 0.70; Hemoglobin 13.3; Potassium 3.5; Sodium 141  Recent Lipid Panel No  results found for: CHOL, TRIG, HDL, CHOLHDL, VLDL, LDLCALC, LDLDIRECT  Physical Exam:    VS:  BP 136/74 (BP Location: Right Arm, Patient Position: Sitting, Cuff Size: Normal)   Pulse 72   Ht 5\' 3"  (1.6 m)   Wt 158 lb (71.7 kg)   SpO2 97%   BMI 27.99 kg/m     Wt Readings from Last 3 Encounters:  02/04/18 158 lb (71.7 kg)  12/28/17 159 lb (72.1 kg)  12/24/17 159 lb 12.8 oz (72.5 kg)     GEN: Patient is in no acute distress HEENT: Normal NECK: No JVD; No carotid bruits LYMPHATICS: No lymphadenopathy CARDIAC: Hear sounds regular, 2/6 systolic murmur at the apex. RESPIRATORY:  Clear to auscultation without rales, wheezing or rhonchi  ABDOMEN: Soft, non-tender, non-distended MUSCULOSKELETAL:  No edema; No deformity  SKIN: Warm and dry NEUROLOGIC:  Alert and oriented x 3 PSYCHIATRIC:  Normal affect   Signed, Jenean Lindau, MD  02/04/2018 4:16 PM    [facility]

## 2018-02-04 NOTE — Patient Instructions (Signed)
Medication Instructions:  Your physician recommends that you continue on your current medications as directed. Please refer to the Current Medication list given to you today.  Labwork: Your physician recommends that you have the following labs drawn: BMP today  Testing/Procedures: None  Follow-Up: Your physician recommends that you schedule a follow-up appointment in: 6 months  Any Other Special Instructions Will Be Listed Below (If Applicable).     If you need a refill on your cardiac medications before your next appointment, please call your pharmacy.   [facility]  Lochlann Mastrangelo A, RN, BSN  

## 2018-02-05 LAB — BASIC METABOLIC PANEL
BUN/Creatinine Ratio: 24 (ref 12–28)
BUN: 16 mg/dL (ref 8–27)
CO2: 24 mmol/L (ref 20–29)
Calcium: 9.6 mg/dL (ref 8.7–10.3)
Chloride: 103 mmol/L (ref 96–106)
Creatinine, Ser: 0.67 mg/dL (ref 0.57–1.00)
GFR calc Af Amer: 99 mL/min/{1.73_m2} (ref >59)
GFR calc non Af Amer: 86 mL/min/{1.73_m2} (ref >59)
GLUCOSE: 104 mg/dL — AB (ref 65–99)
POTASSIUM: 4.2 mmol/L (ref 3.5–5.2)
Sodium: 141 mmol/L (ref 134–144)

## 2018-02-08 DIAGNOSIS — R51 Headache: Secondary | ICD-10-CM | POA: Diagnosis not present

## 2018-02-08 DIAGNOSIS — H6121 Impacted cerumen, right ear: Secondary | ICD-10-CM | POA: Diagnosis not present

## 2018-02-08 DIAGNOSIS — Z9089 Acquired absence of other organs: Secondary | ICD-10-CM | POA: Diagnosis not present

## 2018-02-08 DIAGNOSIS — H61301 Acquired stenosis of right external ear canal, unspecified: Secondary | ICD-10-CM | POA: Diagnosis not present

## 2018-02-08 DIAGNOSIS — Z9889 Other specified postprocedural states: Secondary | ICD-10-CM | POA: Diagnosis not present

## 2018-02-08 DIAGNOSIS — H9190 Unspecified hearing loss, unspecified ear: Secondary | ICD-10-CM | POA: Diagnosis not present

## 2018-02-23 ENCOUNTER — Other Ambulatory Visit: Payer: Self-pay | Admitting: *Deleted

## 2018-02-23 NOTE — Patient Outreach (Signed)
[place] [facility]) Care Management  02/23/2018  Sheila [name] 01-13-1941 [id number]   RN Health Coach Monthly Outreach  Referral Date: 4/11/219 Referral Source: [facility] Reason for Referral: Medication Assistance/Disease Management Education Insurance: Health Team Advantage   Outreach Attempt:  Outreach attempt #1 to patient for monthly follow up. No answer and unable to leave voicemail message due to mailbox is full.  Plan:  RN Health Coach will send unsuccessful outreach letter to patient.  RN Health Coach will make another outreach attempt to patient within 3-4 business days if no return call back from patient.  [place] [phone] [email or web address]

## 2018-02-26 ENCOUNTER — Other Ambulatory Visit: Payer: Self-pay | Admitting: *Deleted

## 2018-02-26 DIAGNOSIS — Z1339 Encounter for screening examination for other mental health and behavioral disorders: Secondary | ICD-10-CM | POA: Diagnosis not present

## 2018-02-26 DIAGNOSIS — K219 Gastro-esophageal reflux disease without esophagitis: Secondary | ICD-10-CM | POA: Diagnosis not present

## 2018-02-26 DIAGNOSIS — K222 Esophageal obstruction: Secondary | ICD-10-CM | POA: Diagnosis not present

## 2018-02-26 DIAGNOSIS — I10 Essential (primary) hypertension: Secondary | ICD-10-CM | POA: Diagnosis not present

## 2018-02-26 DIAGNOSIS — Z6828 Body mass index (BMI) 28.0-28.9, adult: Secondary | ICD-10-CM | POA: Diagnosis not present

## 2018-02-26 NOTE — Patient Outreach (Signed)
[place] [facility]) Care Management  02/26/2018  Sheila [name] [birth year] [id number]   RN Health Coach Monthly Outreach  Referral Date: 4/11/219 Referral Source: [facility] Reason for Referral: Medication Assistance/Disease Management Education Insurance: Health Team Advantage   Outreach Attempt:  Outreach attempt #2 to patient for monthly follow up. No answer and unable to leave voicemail message due to mailbox being full.  Plan:  RN Health Coach will make another outreach attempt to patient within 3-4 business days if no return call back from patient.  [place] [phone] [name]

## 2018-03-02 ENCOUNTER — Other Ambulatory Visit: Payer: Self-pay | Admitting: *Deleted

## 2018-03-02 NOTE — Patient Outreach (Signed)
[place] [facility]) Care Management  03/02/2018  Sheila [name] 07-16-1941 [id number]   RN Health Coach Monthly Outreach  Referral Date: 4/11/219 Referral Source: [facility] Reason for Referral: Medication Assistance/Disease Management Education Insurance: Health Team Advantage   Outreach Attempt:  Received return call from patient while on another call.  RN Health Coach unable to answer call at the time.  Patient requested telephone call back.  Plan:  RN Health Coach will make another telephone outreach to patient within 10 business days.  [place] [phone] Kalik [email or web address]

## 2018-03-02 NOTE — Patient Outreach (Signed)
[place] [facility]) Care Management  03/02/2018  ESTEFANA TAYLOR 12/24/1940 [id number]   RN Health Coach Monthly Outreach  Referral Date: 4/11/219 Referral Source: [facility] Reason for Referral: Medication Assistance/Disease Management Education Insurance: Health Team Advantage   Outreach Attempt:  Outreach attempt #3 to patient for monthly follow up. No answer. RN Health Coach left HIPAA compliant voicemail message along with contact information.  Plan:  RN Health Coach will close case if no return call from patient within 10 day time period from Unsuccessful Letter being mailed to patient.  [place] [phone] [email or web address]

## 2018-03-10 DIAGNOSIS — H402222 Chronic angle-closure glaucoma, left eye, moderate stage: Secondary | ICD-10-CM | POA: Diagnosis not present

## 2018-03-11 DIAGNOSIS — E7800 Pure hypercholesterolemia, unspecified: Secondary | ICD-10-CM | POA: Diagnosis not present

## 2018-03-11 DIAGNOSIS — F419 Anxiety disorder, unspecified: Secondary | ICD-10-CM | POA: Diagnosis not present

## 2018-03-11 DIAGNOSIS — R51 Headache: Secondary | ICD-10-CM | POA: Diagnosis not present

## 2018-03-11 DIAGNOSIS — I10 Essential (primary) hypertension: Secondary | ICD-10-CM | POA: Diagnosis not present

## 2018-03-11 DIAGNOSIS — Z8673 Personal history of transient ischemic attack (TIA), and cerebral infarction without residual deficits: Secondary | ICD-10-CM | POA: Diagnosis not present

## 2018-03-12 ENCOUNTER — Other Ambulatory Visit: Payer: Self-pay | Admitting: *Deleted

## 2018-03-12 NOTE — Patient Outreach (Signed)
[place] [facility]) Care Management  03/12/2018  [name] [birth year] [id number]   RN Health Coach Monthly Outreach  Referral Date: 4/11/219 Referral Source: [facility] Reason for Referral: Medication Assistance/Disease Management Education Insurance: Health Team Advantage   Outreach Attempt:  Outreach attempt #4 to patient for monthly follow up.  Patient answered and stated she was in the grocery store; requested a telephone call back.  Plan:  RN Health Coach will attempt another telephone outreach to patient in the next 3-4 business days.  [place] [phone] [email or web address]

## 2018-03-18 ENCOUNTER — Encounter: Payer: Self-pay | Admitting: *Deleted

## 2018-03-18 ENCOUNTER — Other Ambulatory Visit: Payer: Self-pay | Admitting: *Deleted

## 2018-03-18 NOTE — Patient Outreach (Signed)
[place] [facility]) Care Management  [place]  03/18/2018   Sheila [name] 1941/01/06 [id number]   [place] Monthly Outreach   Referral Date:  4/11/219 Referral Source:  [facility] Reason for Referral:  Medication Assistance/Disease Management Education Insurance:  Health Team Advantage   Outreach Attempt:  Successful telephone outreach to patient for monthly follow up.  HIPAA verified with patient.  Patient reporting continued severe headache, stating feels like "there is a band around my head".  Also reports ear pain on the same side as her previous brain surgery.  Continues to monitor blood pressures at home.  Patient reporting a recent emergency room visit to [facility] related to headache and elevated blood pressure of 200/90 during headache.  States she had taken extra Clonidine dose as instructed by her primary care provider and when she arrived to the ED her SBP was down to 130's.  Verbalizes she is looking forward to her upcoming appointments.  Appointments:  Patient attended last medical appointment on 01/11/2018 with primary care provider.  Reports she sees ear specialist on 03/25/2018 and Neurologist on 8/1/209.  Patient encouraged to keep and attend these appointments and to schedule primary care follow up appointment.  Encounter Medications:  Outpatient Encounter Medications as of 03/18/2018  Medication Sig Note  . ibuprofen (ADVIL,MOTRIN) 200 MG tablet Take 200 mg by mouth every 6 (six) hours as needed for headache.   Marland Kitchen acetaminophen (TYLENOL) 500 MG tablet Take 500 mg by mouth every 6 (six) hours as needed.   . ALPRAZolam (XANAX) 0.25 MG tablet Take 0.25 mg by mouth 2 (two) times daily as needed for anxiety. 12/29/2017: Only takes for procedures  . aspirin EC 81 MG tablet Take 81 mg by mouth daily.   Marland Kitchen atorvastatin (LIPITOR) 20 MG tablet Take 20 mg by mouth every other day.    . brimonidine (ALPHAGAN) 0.15 % ophthalmic solution Place 1 drop into  both eyes every 12 (twelve) hours.    . carvedilol (COREG) 6.25 MG tablet Take 6.25 mg by mouth 2 (two) times daily with a meal.   . cloNIDine (CATAPRES) 0.1 MG tablet Take 0.1 mg by mouth 2 (two) times daily.   . Coenzyme Q10 (CO Q-10) 200 MG CAPS Take 1 tablet by mouth every other day.   . dorzolamide (TRUSOPT) 2 % ophthalmic solution Place 1 drop into both eyes daily.    . furosemide (LASIX) 40 MG tablet Take 40 mg by mouth daily as needed.   . meclizine (ANTIVERT) 25 MG tablet Take 25 mg by mouth 3 (three) times daily as needed for dizziness.   . nitroGLYCERIN (NITROSTAT) 0.4 MG SL tablet Place 1 tablet (0.4 mg total) under the tongue every 5 (five) minutes as needed for chest pain.   . pantoprazole (PROTONIX) 20 MG tablet Take 20 mg by mouth daily. 01/29/2018: Patient reports she is no longer taking this medication and has went back to taking Prilosec but will have a conversation with PCP to prescribe Protonix instead  . sodium chloride (MURO 128) 2 % ophthalmic solution Place 1 drop into both eyes at bedtime. 12/30/2017: Taking as needed per patient  . sucralfate (CARAFATE) 1 g tablet Take 1 g by mouth 4 (four) times daily -  with meals and at bedtime.    . timolol (BETIMOL) 0.5 % ophthalmic solution Place 1 drop into both eyes 2 (two) times daily.     No facility-administered encounter medications on file as of 03/18/2018.  Functional Status:  In your present state of health, do you have any difficulty performing the following activities: 12/30/2017 03/24/2017  Hearing? Y Y  Comment bilateral hearing aides -  Vision? N Y  Comment - does not see well at night  Difficulty concentrating or making decisions? N Y  Walking or climbing stairs? N N  Dressing or bathing? N N  Doing errands, shopping? N N  Preparing Food and eating ? N N  Using the Toilet? N N  In the past six months, have you accidently leaked urine? N N  Do you have problems with loss of bowel control? N N  Managing your  Medications? N Y  Comment - reports medications are expensive  Managing your Finances? N N  Housekeeping or managing your Housekeeping? N N  Some recent data might be hidden    Fall/Depression Screening: Fall Risk  03/18/2018 12/30/2017 03/24/2017  Falls in the past year? No No Yes  Number falls in past yr: - - 1  Injury with Fall? - - No  Risk Factor Category  - - High Fall Risk  Risk for fall due to : - Impaired vision Impaired balance/gait  Follow up - - Falls evaluation completed;Falls prevention discussed   PHQ 2/9 Scores 12/30/2017 12/17/2017 03/24/2017  PHQ - 2 Score 1 1 2   PHQ- 9 Score - - 5    [facility] CM Care Plan Problem One     Most Recent Value  Care Plan Problem One  Assistance with managing blood pressure control and weight loss.  Role Documenting the Problem One  [place] for Problem One  Active  [facility] Long Term Goal   Patient will report a weight loss of 3 pounds in the next 90 days.  [facility] Long Term Goal Start Date  03/18/18  Interventions for Problem One Long Term Goal  Reviewed and discussed current care plan and goals with patient, discussed increasing activity level as tolerated, discussed alternatives to outside acivities with severe heat, fall preventions reviewed and discussed, encouraged patient to utilize cane or walker to help for balance and prevent falls, reviewed medications and encouraged compliance, encouraged patient to shedule follow up medical appointment  [facility] CM Short Term Goal #1   Patient will report a decrease in headache in the next 30 days.  [facility] CM Short Term Goal #1 Start Date  03/18/18  [facility] CM Short Term Goal #1 Met Date  03/18/18  Interventions for Short Term Goal #1  encouraged patient to keep and attend upcoming medical appiontments to address headache and ear ache, encouraged patient to schedule follow up appointment with Dr. Nicki Reaper, discussed relationship with severe headache and increased blood pressure, encouraged medication  compliance, encouraged patient to continue to monitor blood pressures and document related headaches with elevations  [facility] CM Short Term Goal #2   Patient will attend Neurologist appointment on 04/08/2018.  [facility] CM Short Term Goal #2 Start Date  03/18/18  [facility] CM Short Term Goal #2 Met Date  03/18/18  Interventions for Short Term Goal #2  confirmed patient aware of date and time of Neurologist appointment of 04/08/2018. encouraged patient to keep and attend this medical appointment,      Plan:   [place] will make next monthly telephone outreach to patient in the month of August. [place] will send primary care provider Quarterly Update.  [place] [phone] [email or web address]

## 2018-03-19 DIAGNOSIS — Z79899 Other long term (current) drug therapy: Secondary | ICD-10-CM | POA: Diagnosis not present

## 2018-03-19 DIAGNOSIS — E118 Type 2 diabetes mellitus with unspecified complications: Secondary | ICD-10-CM | POA: Diagnosis not present

## 2018-03-19 DIAGNOSIS — E782 Mixed hyperlipidemia: Secondary | ICD-10-CM | POA: Diagnosis not present

## 2018-03-19 DIAGNOSIS — K219 Gastro-esophageal reflux disease without esophagitis: Secondary | ICD-10-CM | POA: Diagnosis not present

## 2018-03-24 DIAGNOSIS — Z6828 Body mass index (BMI) 28.0-28.9, adult: Secondary | ICD-10-CM | POA: Diagnosis not present

## 2018-03-24 DIAGNOSIS — H6090 Unspecified otitis externa, unspecified ear: Secondary | ICD-10-CM | POA: Diagnosis not present

## 2018-03-24 DIAGNOSIS — R51 Headache: Secondary | ICD-10-CM | POA: Diagnosis not present

## 2018-03-25 DIAGNOSIS — H906 Mixed conductive and sensorineural hearing loss, bilateral: Secondary | ICD-10-CM | POA: Diagnosis not present

## 2018-03-25 DIAGNOSIS — G960 Cerebrospinal fluid leak: Secondary | ICD-10-CM | POA: Diagnosis not present

## 2018-03-25 DIAGNOSIS — H9311 Tinnitus, right ear: Secondary | ICD-10-CM | POA: Diagnosis not present

## 2018-04-03 DIAGNOSIS — M1990 Unspecified osteoarthritis, unspecified site: Secondary | ICD-10-CM | POA: Diagnosis not present

## 2018-04-03 DIAGNOSIS — R079 Chest pain, unspecified: Secondary | ICD-10-CM | POA: Diagnosis not present

## 2018-04-03 DIAGNOSIS — J449 Chronic obstructive pulmonary disease, unspecified: Secondary | ICD-10-CM | POA: Diagnosis not present

## 2018-04-03 DIAGNOSIS — F418 Other specified anxiety disorders: Secondary | ICD-10-CM | POA: Diagnosis not present

## 2018-04-03 DIAGNOSIS — I10 Essential (primary) hypertension: Secondary | ICD-10-CM | POA: Diagnosis not present

## 2018-04-03 DIAGNOSIS — Z8673 Personal history of transient ischemic attack (TIA), and cerebral infarction without residual deficits: Secondary | ICD-10-CM | POA: Diagnosis not present

## 2018-04-03 DIAGNOSIS — R0789 Other chest pain: Secondary | ICD-10-CM | POA: Diagnosis not present

## 2018-04-03 DIAGNOSIS — R072 Precordial pain: Secondary | ICD-10-CM | POA: Diagnosis not present

## 2018-04-07 DIAGNOSIS — E118 Type 2 diabetes mellitus with unspecified complications: Secondary | ICD-10-CM | POA: Diagnosis not present

## 2018-04-07 DIAGNOSIS — E782 Mixed hyperlipidemia: Secondary | ICD-10-CM | POA: Diagnosis not present

## 2018-04-07 DIAGNOSIS — K219 Gastro-esophageal reflux disease without esophagitis: Secondary | ICD-10-CM | POA: Diagnosis not present

## 2018-04-08 ENCOUNTER — Encounter: Payer: Self-pay | Admitting: Neurology

## 2018-04-08 ENCOUNTER — Ambulatory Visit (INDEPENDENT_AMBULATORY_CARE_PROVIDER_SITE_OTHER): Payer: PPO | Admitting: Neurology

## 2018-04-08 VITALS — BP 108/60 | HR 64 | Ht 63.0 in | Wt 157.0 lb

## 2018-04-08 DIAGNOSIS — I2510 Atherosclerotic heart disease of native coronary artery without angina pectoris: Secondary | ICD-10-CM | POA: Diagnosis not present

## 2018-04-08 DIAGNOSIS — I10 Essential (primary) hypertension: Secondary | ICD-10-CM

## 2018-04-08 DIAGNOSIS — R519 Headache, unspecified: Secondary | ICD-10-CM

## 2018-04-08 DIAGNOSIS — R51 Headache: Secondary | ICD-10-CM

## 2018-04-08 MED ORDER — NORTRIPTYLINE HCL 10 MG PO CAPS: 10.0000 mg | ORAL_CAPSULE | Freq: Every day | ORAL | 3 refills | Status: DC

## 2018-04-08 NOTE — Patient Instructions (Signed)
1.  I will check with your cardiologist to find out what medication would be okay to start you on to help prevent headaches 2.  We will order spinal tap to measure opening pressure.  Further recommendations pending results 3.  Limit Tylenol to no more than 2 days out of week to prevent rebound headache 4.  Keep headache diary 5.  Follow up in 4 months but we will make changes in management and further testing in between visit.

## 2018-04-08 NOTE — Progress Notes (Signed)
NEUROLOGY CONSULTATION NOTE  Sheila [name] MRN: [id number] DOB: 04-21-1941  Referring provider: Dr. Nicki Reaper Primary care provider: Dr. Nicki Reaper  Reason for consult:  Headache and dizziness  HISTORY OF PRESENT ILLNESS: Sheila [name] is a 77 year old right-handed female with CAD s/p MI, COPD, CHF, hypertension, dyslipidemia and GERD who presents for headache and dizziness.  She is accompanied by her daughter and friend who supplement history.  History supplemented by neurosurgery and ENT notes.  She began having headaches associated with pulsatile tinnitus around 2012-2013.  She describes a constant moderate non-throbbing headache in a band-like distribution but worse on the right side.  She has associated severe intermittent sharp pains in the right temple, ear and radiating down the right side of the neck.  Sometimes she has associated chest discomfort and nausea but no vomiting, photophobia, phonophobia or unilateral numbness or weakness.  She also has constant blurred vision which improves (but no resolves) when covering either eye. She sees a glaucoma specialist.  It is aggravated when standing and relieved when supine.  Rest helps relieve it.  She takes Tylenol, maybe 2 or 3 days a week.  She reportedly had an MRA of the head.  She underwent right tympanomastoidectomy for pulsatile tinnitus in June 2017 by ENT.  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  She was doing fine until the following November when she had sneezed and developed clear drainage from the nose.  Imaging revealed right tegmen defect causing a CSF leak.  She underwent right subtemporal craniotomy for repair of encephalocele on 08/28/16.  She continued to have headache, dizziness and unsteady gait.  In February of 2018, she began having right facial swelling and followed up with ENT.  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  She had elevated blood pressure readings with SBP in the 200s.  MRI of head was reportedly unremarkable.  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  She has had subsequent head CTs on 04/27/17 and 07/06/17, both which reportedly showed no acute abnormalities.  Most recent MRI of brain with and without contrast from 11/10/17 again showed postsurgical changes of the right middle cranial fossa craniotomy and diffuse hemosiderin staining along the cerebellar folia, surface of brainstem and bilateral sylvian fissure compatible with superficial siderosis or prior subarachnoid hemorrhage but no acute intracranial abnormality.  Neurosurgery believed her symptoms and MRI findings were not surgical.  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  Recurrent CSF leak was suspected to be secondary to increased CSF pressure either due to hydrocephalus or benign intracranial hypertension.  Headaches have not improved.  She still has pulsatile tinnitus in the .  PAST MEDICAL HISTORY: Past Medical History:  Diagnosis Date  . Allergy   . Anxiety   . Arthritis   . Asthma   . Blood transfusion without reported diagnosis   . Cataract   . Depression   . GERD (gastroesophageal reflux disease)   .  Glaucoma   . Heart murmur   . Hyperlipidemia   . Hypertension     PAST SURGICAL HISTORY: Past Surgical History:  Procedure Laterality Date  . CESAREAN SECTION    . EYE SURGERY      . LEFT HEART CATH AND CORONARY ANGIOGRAPHY N/A 12/28/2017   Procedure: LEFT HEART CATH AND CORONARY ANGIOGRAPHY;  Surgeon: Belva Crome, MD;  Location: [facility];  Service: Cardiovascular;  Laterality: N/A;  . MASTOIDECTOMY    . PATENT DUCTUS ARTERIOUS REPAIR    . SHOULDER OPEN ROTATOR CUFF REPAIR Bilateral     MEDICATIONS: Current Outpatient Medications on File Prior to Visit  Medication Sig Dispense Refill  . acetaminophen (TYLENOL) 500 MG tablet Take 500 mg by mouth every 6 (six) hours as needed.    . ALPRAZolam (XANAX) 0.25 MG tablet Take 0.25 mg by mouth 2 (two) times daily as needed for anxiety.    Marland Kitchen aspirin EC 81 MG tablet Take 81 mg by mouth daily.    Marland Kitchen atorvastatin (LIPITOR) 20 MG tablet Take 20 mg by mouth every other day.     . brimonidine (ALPHAGAN) 0.15 % ophthalmic solution Place 1 drop into both eyes every 12 (twelve) hours.     . carvedilol (COREG) 6.25 MG tablet Take 6.25 mg by mouth 2 (two) times daily with a meal.    . cloNIDine (CATAPRES) 0.1 MG tablet Take 0.1 mg by mouth 2 (two) times daily.    . Coenzyme Q10 (CO Q-10) 200 MG CAPS Take 1 tablet by mouth every other day.    . dorzolamide (TRUSOPT) 2 % ophthalmic solution Place 1 drop into both eyes daily.     . furosemide (LASIX) 40 MG tablet Take 40 mg by mouth daily as needed.  6  . ibuprofen (ADVIL,MOTRIN) 200 MG tablet Take 200 mg by mouth every 6 (six) hours as needed for headache.    . meclizine (ANTIVERT) 25 MG tablet Take 25 mg by mouth 3 (three) times daily as needed for dizziness.    . pantoprazole (PROTONIX) 20 MG tablet Take 20 mg by mouth daily.    . sodium chloride (MURO 128) 2 % ophthalmic solution Place 1 drop into both eyes at bedtime.    . sucralfate (CARAFATE) 1 g tablet Take 1 g by mouth 4 (four) times daily -  with meals and at bedtime.     . timolol (BETIMOL) 0.5 % ophthalmic solution Place 1 drop into both eyes 2 (two) times daily.     . nitroGLYCERIN (NITROSTAT) 0.4 MG SL tablet Place  1 tablet (0.4 mg total) under the tongue every 5 (five) minutes as needed for chest pain. (Patient not taking: Reported on 04/08/2018) 25 tablet 11   No current facility-administered medications on file prior to visit.     ALLERGIES: Allergies  Allergen Reactions  . Antihistamines, Chlorpheniramine-Type Anaphylaxis  . Ativan [Lorazepam] Swelling  . Ceftin [Cefuroxime Axetil]   . Meloxicam   . Pheneen [Benzalkonium Chloride]   . Morphine Palpitations    FAMILY HISTORY: Family History  Problem Relation Age of Onset  . Heart attack Mother   . Heart attack Sister   . Cancer Brother     SOCIAL HISTORY: Social History   Socioeconomic History  . Marital status: Divorced    Spouse name: Not on file  . Number of children: Not on file  . Years of education: Not on file  . Highest education level: Not on file  Occupational History  .  Not on file  Social Needs  . Financial resource strain: Not on file  . Food insecurity:    Worry: Not on file    Inability: Not on file  . Transportation needs:    Medical: Not on file    Non-medical: Not on file  Tobacco Use  . Smoking status: Never Smoker  . Smokeless tobacco: Never Used  Substance and Sexual Activity  . Alcohol use: No  . Drug use: No  . Sexual activity: Not on file  Lifestyle  . Physical activity:    Days per week: Not on file    Minutes per session: Not on file  . Stress: Not on file  Relationships  . Social connections:    Talks on phone: Not on file    Gets together: Not on file    Attends religious service: Not on file    Active member of club or organization: Not on file    Attends meetings of clubs or organizations: Not on file    Relationship status: Not on file  . Intimate partner violence:    Fear of current or ex partner: Not on file    Emotionally abused: Not on file    Physically abused: Not on file    Forced sexual activity: Not on file  Other Topics Concern  . Not on file  Social History  Narrative  . Not on file    REVIEW OF SYSTEMS: Constitutional: No fevers, chills, or sweats, no generalized fatigue, change in appetite Eyes: No visual changes, double vision, eye pain Ear, nose and throat: bilateral hearing loss Cardiovascular: No chest pain, palpitations Respiratory:  No shortness of breath at rest or with exertion, wheezes GastrointestinaI: No nausea, vomiting, diarrhea, abdominal pain, fecal incontinence Genitourinary:  No dysuria, urinary retention or frequency Musculoskeletal:  Neck pain Integumentary: No rash, pruritus, skin lesions Neurological: as above Psychiatric: No depression, insomnia, anxiety Endocrine: No palpitations, fatigue, diaphoresis, mood swings, change in appetite, change in weight, increased thirst Hematologic/Lymphatic:  No purpura, petechiae. Allergic/Immunologic: no itchy/runny eyes, nasal congestion, recent allergic reactions, rashes  PHYSICAL EXAM: Vitals:   04/08/18 0915  BP: 108/60  Pulse: 64  SpO2: 97%   General: No acute distress.  Patient appears well-groomed.  Head:  Normocephalic/atraumatic Eyes:  fundi examined but not visualized Neck: supple, tenderness, full range of motion Back: No paraspinal tenderness Heart: regular rate and rhythm Lungs: Clear to auscultation bilaterally. Vascular: No carotid bruits. Neurological Exam: Mental status: alert and oriented to person, place, and time, recent and remote memory intact, fund of knowledge intact, attention and concentration intact, speech fluent and not dysarthric, language intact. Cranial nerves: CN I: not tested CN II: pupils equal, round and reactive to light, visual fields intact CN III, IV, VI:  full range of motion, no nystagmus, no ptosis CN V: facial sensation intact CN VII: upper and lower face symmetric CN VIII: hearing intact CN IX, X: gag intact, uvula midline CN XI: sternocleidomastoid and trapezius muscles intact CN XII: tongue midline Bulk & Tone:  normal, no fasciculations. Motor:  5/5 throughout  Sensation: temperature and vibration sensation intact. Deep Tendon Reflexes:  2+ throughout, toes downgoing.  Finger to nose testing:  Without dysmetria.  Heel to shin:  Without dysmetria.  Gait:  Normal station and stride.  Romberg negative.  IMPRESSION: Headache, unspecified.  Complicated medical history.  There is concerned on recent otologic exam of CSF in the middle ear.  MRI of brain performed by neurosurgery found no such  evidence.  There is concerned for increased intracranial pressure.  MRI of brain reveals no ventriculomegaly, so NPH unlikely.    PLAN: 1.  After discussion, we will proceed with LP to evaluate opening pressure.  We will also check CSF for cell count, protein, glucose and gram stain and culture. 2.  I would like to start preventative medication for headache.  I would like to start nortriptyline 10mg  at bedtime.  EKG from 12/08/17 showed QTc 435.  3.  3.  Limit Tylenol to no more than 2 days out of week to prevent rebound headache 4.  Keep headache diary 5.  Follow up in 4 months but we will make changes in management and further testing in between visit.  Thank you for allowing me to take part in the care of this patient.  Metta Clines, DO  CC: Ann Held, MD

## 2018-04-08 NOTE — Addendum Note (Signed)
Addended byTomi Likens, Atari Novick R on: 04/08/2018 04:07 PM   Modules accepted: Orders

## 2018-04-12 ENCOUNTER — Other Ambulatory Visit: Payer: Self-pay | Admitting: *Deleted

## 2018-04-12 DIAGNOSIS — K219 Gastro-esophageal reflux disease without esophagitis: Secondary | ICD-10-CM | POA: Diagnosis not present

## 2018-04-12 DIAGNOSIS — Z6828 Body mass index (BMI) 28.0-28.9, adult: Secondary | ICD-10-CM | POA: Diagnosis not present

## 2018-04-12 NOTE — Patient Outreach (Signed)
[place] [facility]) Care Management  04/12/2018  Sheila [name] [birth year] [id number]   RN Health Coach Monthly Outreach  Referral Date: 4/11/219 Referral Source: [facility] Reason for Referral: Medication Assistance/Disease Management Education Insurance: Health Team Advantage   Outreach Attempt:  Outreach attempt #1 to patient for monthly follow up.  Patient answered and stated she was unable to speak at this time; requested telephone call back.   Plan:  RN Health Coach will make another telephone outreach to patient within the next 5 business days.  [place] [phone] [name]

## 2018-04-14 DIAGNOSIS — H906 Mixed conductive and sensorineural hearing loss, bilateral: Secondary | ICD-10-CM | POA: Diagnosis not present

## 2018-04-14 DIAGNOSIS — H9311 Tinnitus, right ear: Secondary | ICD-10-CM | POA: Diagnosis not present

## 2018-04-14 DIAGNOSIS — G960 Cerebrospinal fluid leak: Secondary | ICD-10-CM | POA: Diagnosis not present

## 2018-04-15 ENCOUNTER — Telehealth: Payer: Self-pay | Admitting: Neurology

## 2018-04-15 DIAGNOSIS — H5711 Ocular pain, right eye: Secondary | ICD-10-CM | POA: Diagnosis not present

## 2018-04-15 NOTE — Telephone Encounter (Signed)
Patient called and left a voicemail trying to get some information about a spinal tap appointment that Dr.Jaffe was supposed to order. She had some other questions. Please call her back at [phone]. Thanks.

## 2018-04-15 NOTE — Telephone Encounter (Signed)
Called [facility], LMOVM for Roberta to contact Pt to schedule.  Called Pt, advised her I called and LM for them to contact her. I also provided her with [place] number to call if she has not heard from them by lunch tomorrow. I advised Pt to callme back if no resolution by Monday.

## 2018-04-16 ENCOUNTER — Other Ambulatory Visit: Payer: Self-pay | Admitting: *Deleted

## 2018-04-16 NOTE — Patient Outreach (Addendum)
[place] [facility]) Care Management  04/16/2018  Sheila [name] 15-Oct-1940 [id number]   RN Health Coach Monthly Outreach  Referral Date: 4/11/219 Referral Source: [facility] Reason for Referral: Medication Assistance/Disease Management Education Insurance: Health Team Advantage   Outreach Attempt:  Outreach attempt #2 to patient for monthly follow up. No answer and unable to leave voicemail message due to voicemail box is full.  Plan:  RN Health Coach will send unsuccessful outreach letter to patient.  RN Health Coach will make another outreach attempt to patient within 3-4 business days if no return call back from patient.  [place] [phone] [email or web address]

## 2018-04-21 ENCOUNTER — Encounter: Payer: Self-pay | Admitting: *Deleted

## 2018-04-21 ENCOUNTER — Other Ambulatory Visit: Payer: Self-pay | Admitting: *Deleted

## 2018-04-21 NOTE — Patient Outreach (Signed)
[place] [facility]) Care Management  04/21/2018  Sheila [name] 1941/07/04 [id number]   RN Health Coach Monthly Outreach  Referral Date: 4/11/219 Referral Source: [facility] Reason for Referral: Medication Assistance/Disease Management Education Insurance: Health Team Advantage   Outreach Attempt:  Successful telephone outreach to patient for monthly follow up.  HIPAA verified with patient.  Patient stating she is doing some better.  Reports she has seen Neurologist and is scheduled for a Lumbar Puncture on 04/23/2018.  States she continues to have the headaches and ear pain, but pain is better after initiation of Zanaflex.  Patient also reporting her reflux or indigestion is better after starting Protonix and zantac.  Continues to monitor blood pressures daily.  Reporting one episode of symptomatic hypertension, with headache and blood pressure reading of 200/80.  She states her blood pressure trended down after taking her medication and relaxing.  Patient reporting she was unable to take nortriptyline due to making her feel anxious.  Appointments:  Patient attended appointment with Dr. Nicki Reaper, primary care provider on 04/12/2018 and will follow up with him after headache and lumbar puncture evaluation is complete.  Plan: RN Health Coach will make next monthly outreach to patient in the month of September.  [place] [phone] [name]

## 2018-04-23 ENCOUNTER — Ambulatory Visit
Admission: RE | Admit: 2018-04-23 | Discharge: 2018-04-23 | Disposition: A | Discharge status: Home / Self Care | Payer: PPO | Source: Ambulatory Visit | Attending: Neurology | Admitting: Neurology

## 2018-04-23 DIAGNOSIS — R519 Headache, unspecified: Secondary | ICD-10-CM

## 2018-04-23 DIAGNOSIS — R51 Headache: Secondary | ICD-10-CM | POA: Diagnosis not present

## 2018-04-23 NOTE — Discharge Instructions (Signed)

## 2018-04-25 DIAGNOSIS — R0602 Shortness of breath: Secondary | ICD-10-CM | POA: Diagnosis not present

## 2018-04-25 DIAGNOSIS — Z8673 Personal history of transient ischemic attack (TIA), and cerebral infarction without residual deficits: Secondary | ICD-10-CM | POA: Diagnosis not present

## 2018-04-25 DIAGNOSIS — G971 Other reaction to spinal and lumbar puncture: Secondary | ICD-10-CM | POA: Diagnosis not present

## 2018-04-25 DIAGNOSIS — E049 Nontoxic goiter, unspecified: Secondary | ICD-10-CM | POA: Diagnosis not present

## 2018-04-25 DIAGNOSIS — J449 Chronic obstructive pulmonary disease, unspecified: Secondary | ICD-10-CM | POA: Diagnosis not present

## 2018-04-26 DIAGNOSIS — G971 Other reaction to spinal and lumbar puncture: Secondary | ICD-10-CM | POA: Diagnosis not present

## 2018-04-26 DIAGNOSIS — E049 Nontoxic goiter, unspecified: Secondary | ICD-10-CM | POA: Diagnosis not present

## 2018-04-27 LAB — CSF CULTURE
MICRO NUMBER: 90976881
SPECIMEN QUALITY: ADEQUATE

## 2018-04-27 LAB — PROTEIN, CSF: Total Protein, CSF: 113 mg/dL — ABNORMAL HIGH (ref 15–60)

## 2018-04-27 LAB — CSF CELL COUNT WITH DIFFERENTIAL
RBC COUNT CSF: 8450 {cells}/uL — AB (ref 0–10)
WBC CSF: 0 {cells}/uL (ref 0–5)

## 2018-04-27 LAB — CSF CULTURE W GRAM STAIN: Result:: NO GROWTH

## 2018-04-27 LAB — GLUCOSE, CSF: Glucose, CSF: 62 mg/dL (ref 40–80)

## 2018-04-28 ENCOUNTER — Telehealth: Payer: Self-pay | Admitting: Neurology

## 2018-04-28 DIAGNOSIS — R1084 Generalized abdominal pain: Secondary | ICD-10-CM | POA: Diagnosis not present

## 2018-04-28 DIAGNOSIS — F411 Generalized anxiety disorder: Secondary | ICD-10-CM | POA: Diagnosis not present

## 2018-04-28 DIAGNOSIS — K219 Gastro-esophageal reflux disease without esophagitis: Secondary | ICD-10-CM | POA: Diagnosis not present

## 2018-04-28 DIAGNOSIS — R300 Dysuria: Secondary | ICD-10-CM | POA: Diagnosis not present

## 2018-04-28 NOTE — Telephone Encounter (Signed)
Sheila [name] underwent lumbar puncture.  She had a normal opening pressure of 10 cm H2O.  It was a traumatic tap.  CSF protein was elevated at 113.  I don't think this elevated protein can completely be explained by the traumatic tap.  Possibly secondary to intermittent CSF leak.  From a neurologic standpoint, I would continue treating for headache.  Would recommend testing for potential CSF if fluid should again be identified on subsequent ear exams.

## 2018-04-28 NOTE — Telephone Encounter (Signed)
Patient had a spinal tap done on Friday and had to go the ER on Sunday in [place]. Patient would like the results of the spinal tap please call

## 2018-04-28 NOTE — Telephone Encounter (Signed)
Spoke with Pt, advised her of LP results. Pt states she has been having severe headaches, had been to the ER, she had a blood patch, has tried caffeine. She spoke to her PCP earlier about heaviness in her chest, feels like indigestion. PCP advised her to go to urgent care, she is going now.

## 2018-04-29 ENCOUNTER — Encounter: Payer: Self-pay | Admitting: *Deleted

## 2018-04-29 ENCOUNTER — Other Ambulatory Visit: Payer: Self-pay | Admitting: *Deleted

## 2018-04-29 DIAGNOSIS — R040 Epistaxis: Secondary | ICD-10-CM | POA: Diagnosis not present

## 2018-04-29 DIAGNOSIS — Z0000 Encounter for general adult medical examination without abnormal findings: Secondary | ICD-10-CM | POA: Diagnosis not present

## 2018-04-29 DIAGNOSIS — K219 Gastro-esophageal reflux disease without esophagitis: Secondary | ICD-10-CM | POA: Diagnosis not present

## 2018-04-29 DIAGNOSIS — G971 Other reaction to spinal and lumbar puncture: Secondary | ICD-10-CM | POA: Diagnosis not present

## 2018-04-29 DIAGNOSIS — Z6828 Body mass index (BMI) 28.0-28.9, adult: Secondary | ICD-10-CM | POA: Diagnosis not present

## 2018-04-29 DIAGNOSIS — R0989 Other specified symptoms and signs involving the circulatory and respiratory systems: Secondary | ICD-10-CM | POA: Diagnosis not present

## 2018-04-29 NOTE — Patient Outreach (Signed)
[place] [facility]) Care Management  04/29/2018  Sheila [name] [birth year] [id number]   RN Health Coach Post Procedural Follow Up  Referral Date: 4/11/219 Referral Source: [place] Reason for Referral: Medication Assistance/Disease Management Education Insurance: Health Team Advantage   Outreach Attempt:  Successful telephone outreach to patient for post lumbar puncture and emergency room follow up.  HIPAA verified with patient.  Patient reporting she did have lumbar puncture completed on last Friday, 04/23/2018 and since then she has not felt well.  Reports severe headache on Sunday, 04/25/2018 and going to the emergency room as suggested by [place].  States headache was treated with a blood patch the next day.  Reports continued headache at times, but not as severe.  States she was having really bad shortness of breath and indigestion yesterday.  Was directed to emergency room by 24 hour nurse line; instead patient contacted primary care office and was instructed to go to urgent care wish she did.  Reports indigestion was relieved with GI cocktail.  Reports no more signs and symptoms of indigestion.  Reports blood pressures have been within range most days with slight elevation last night with nose bleed.  Encouraged to rest as much as possible until seen by primary care provider and to increase activities slowly.  Appointments:  Patient reports having medical appointment with Dr. Nicki Reaper today at 2:20.  Encouraged to keep and attend appointment.  Plan: RN Health Coach will make next monthly outreach to patient in the month of September.  [place] [phone] [email or web address]

## 2018-05-08 DIAGNOSIS — J449 Chronic obstructive pulmonary disease, unspecified: Secondary | ICD-10-CM | POA: Diagnosis not present

## 2018-05-08 DIAGNOSIS — R040 Epistaxis: Secondary | ICD-10-CM | POA: Diagnosis not present

## 2018-05-08 DIAGNOSIS — I10 Essential (primary) hypertension: Secondary | ICD-10-CM | POA: Diagnosis not present

## 2018-05-08 DIAGNOSIS — Z7982 Long term (current) use of aspirin: Secondary | ICD-10-CM | POA: Diagnosis not present

## 2018-05-17 ENCOUNTER — Other Ambulatory Visit: Payer: Self-pay

## 2018-05-17 ENCOUNTER — Other Ambulatory Visit: Payer: Self-pay | Admitting: Pharmacy Technician

## 2018-05-17 DIAGNOSIS — R040 Epistaxis: Secondary | ICD-10-CM | POA: Diagnosis not present

## 2018-05-17 DIAGNOSIS — H9311 Tinnitus, right ear: Secondary | ICD-10-CM | POA: Diagnosis not present

## 2018-05-17 DIAGNOSIS — H906 Mixed conductive and sensorineural hearing loss, bilateral: Secondary | ICD-10-CM | POA: Diagnosis not present

## 2018-05-17 NOTE — Patient Outreach (Addendum)
[facility]) Care Management  05/17/2018  Sheila [name] 07/18/1941 [id number]  77 year old female referred to [place] Management.  [place] services requested for medication assistance for Brimonidine (Alphagan) and Truspot.  PMHx includes, but not limited to, coronary artery disease, hypertension, GERD and dyslipidemia.  Successful outreach attempt to [name].  HIPAA identifiers verified.   Medication Assistance:  [name] reports that she is having trouble affording the $15 co-pays for Brimonidine (Alphagan) and Truspot each month.  Per financial discussion, [name] does not qualify for Extra Help LIS.   [name] is below the [place] and [employer]ck income limits to be eligible for their patient assistance programs.  They do not require an out of pocket prescription expenditure.   Plan: Route letter to [facility], Etter Sjogren to begin patient assistance applications for Alphagan P made by [employer]er[employer] and Trusopt made by [employer].  Follow up in 7-10 days to see if applications received.  Joetta Manners, PharmD Clinical Pharmacist [place] [phone]

## 2018-05-17 NOTE — Patient Outreach (Signed)
[facility] [facility]) Care Management  05/17/2018  Sheila [name] 23-Jun-1941 [id number]   Received [employer] and [employer] patient assistance referral from [place] for Alphagan P and Trusopt. Prepared patient portions of applications to be mailed, faxed provider portion of [employer]er[employer] (Alphagan) application to Dr. Cheryln Manly, mailed Merck (Trusopt) provider portion to Dr. Cheryln Manly.  Will follow up with patient in 7-10 business days to confirm applications have been received.  Maud Deed [place], [place] Management [phone]

## 2018-05-19 DIAGNOSIS — Z6828 Body mass index (BMI) 28.0-28.9, adult: Secondary | ICD-10-CM | POA: Diagnosis not present

## 2018-05-19 DIAGNOSIS — J40 Bronchitis, not specified as acute or chronic: Secondary | ICD-10-CM | POA: Diagnosis not present

## 2018-05-19 DIAGNOSIS — J329 Chronic sinusitis, unspecified: Secondary | ICD-10-CM | POA: Diagnosis not present

## 2018-05-21 ENCOUNTER — Other Ambulatory Visit: Payer: Self-pay | Admitting: Otolaryngology

## 2018-05-21 DIAGNOSIS — H9311 Tinnitus, right ear: Secondary | ICD-10-CM

## 2018-05-21 DIAGNOSIS — H906 Mixed conductive and sensorineural hearing loss, bilateral: Secondary | ICD-10-CM

## 2018-05-28 ENCOUNTER — Encounter: Payer: Self-pay | Admitting: *Deleted

## 2018-05-28 ENCOUNTER — Ambulatory Visit
Admission: RE | Admit: 2018-05-28 | Discharge: 2018-05-28 | Disposition: A | Discharge status: Home / Self Care | Payer: PPO | Source: Ambulatory Visit | Attending: Otolaryngology | Admitting: Otolaryngology

## 2018-05-28 ENCOUNTER — Other Ambulatory Visit: Payer: Self-pay | Admitting: *Deleted

## 2018-05-28 DIAGNOSIS — R51 Headache: Secondary | ICD-10-CM | POA: Diagnosis not present

## 2018-05-28 DIAGNOSIS — H906 Mixed conductive and sensorineural hearing loss, bilateral: Secondary | ICD-10-CM

## 2018-05-28 DIAGNOSIS — H9311 Tinnitus, right ear: Secondary | ICD-10-CM

## 2018-05-28 MED ORDER — GADOBENATE DIMEGLUMINE 529 MG/ML IV SOLN
14.0000 mL | Freq: Once | INTRAVENOUS | Status: AC | PRN
  Administered 2018-05-28: 14 mL via INTRAVENOUS

## 2018-05-28 NOTE — Patient Outreach (Signed)
[place] [facility]) Care Management  05/28/2018  Sheila [name] Jan 22, 1941 [id number]   RN Health Coach Monthly Outreach  Referral Date: 4/11/219 Referral Source: [facility] Reason for Referral: Medication Assistance/Disease Management Education Insurance: Health Team Advantage   Outreach Attempt:  Successful telephone outreach to patient for monthly follow up.  HIPAA verified with patient.  Patient reporting she continues to not feel well.  Reports being treated for bronchitis with Z-pack she has completed.  Endorses she still has a little cough but overall feels better.  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  Also states she had a severe nose bleed while at ENT office last week and reports she has been having an increase in nose bleeds.  Patient stating she is scheduled for a MRI of her head today.  Encouraged to keep and attend this testing.  Reports her blood pressure has only been elevated about once in the past few weeks and it returned to normal with her daily medications.  Appointments:   Attended appointment with primary care provider, Dr. Nicki Reaper on 05/19/2018 and ENT, Dr. Thornell Mule on 05/17/2018.  Plans to follow up with both after MRI is completed and resulted.  Plan: RN Health Coach will make next telephone outreach to patient in the month of October.  [name] [phone] [name]

## 2018-05-31 ENCOUNTER — Other Ambulatory Visit: Payer: Self-pay

## 2018-05-31 ENCOUNTER — Ambulatory Visit: Payer: Self-pay

## 2018-05-31 NOTE — Patient Outreach (Signed)
[place] [facility]) Care Management  05/31/2018  [name] 1940/12/03 [id number]  77 year old female referred to [place] Management.  [place] services requested for medication assistance for Brimonidine (Alphagan) and Truspot.  PMHx includes, but not limited to, coronary artery disease, hypertension, GERD and dyslipidemia.  Successful outreach attempt to Ms. Sheila [name].  HIPAA identifiers verified.   Medication Assistance: Patient verified that she has received her patient assistance application and she will begin filling them out today.  Plan: Will continue to follow patient assitance with [facility] CPhT, Etter Sjogren.  Joetta Manners, PharmD Clinical Pharmacist [place] [phone]

## 2018-06-02 ENCOUNTER — Other Ambulatory Visit: Payer: PPO

## 2018-06-03 ENCOUNTER — Encounter: Payer: Self-pay | Admitting: Pharmacy Technician

## 2018-06-03 ENCOUNTER — Other Ambulatory Visit: Payer: Self-pay | Admitting: Pharmacy Technician

## 2018-06-03 NOTE — Patient Outreach (Signed)
[facility]) Care Management  06/03/2018  LAURAMAE KNEISLEY 1940/09/16 [id number]   Successful outreach call, HIPAA identifiers verified. Informed patient that I received her patient assistance applications however, her proof of income was not included. She states that she has a copy of it. Will mail her a return envelope.  Once documents are received, will submit to [place].  Maud Deed [place], [place] Management [phone]

## 2018-06-07 DIAGNOSIS — K219 Gastro-esophageal reflux disease without esophagitis: Secondary | ICD-10-CM | POA: Diagnosis not present

## 2018-06-07 DIAGNOSIS — E782 Mixed hyperlipidemia: Secondary | ICD-10-CM | POA: Diagnosis not present

## 2018-06-07 DIAGNOSIS — E118 Type 2 diabetes mellitus with unspecified complications: Secondary | ICD-10-CM | POA: Diagnosis not present

## 2018-06-14 ENCOUNTER — Ambulatory Visit: Payer: Self-pay | Admitting: Pharmacy Technician

## 2018-06-15 ENCOUNTER — Other Ambulatory Visit: Payer: Self-pay | Admitting: Pharmacy Technician

## 2018-06-15 NOTE — Patient Outreach (Signed)
[place] [facility]) Care Management  06/15/2018  [name] 07-24-1941 [id number]   Successful outreach call to Ms. Sheila [name], HIPAA identifiers verified. Confirmed patient received envelope that was mailed to her to mail her proof of income back in. She stated that she received it and was sick over the weekend. She stated that she would put in the mail today.  Ms. Sheila [name] voiced concern about having trouble receiving her Protonix from mail order. She states that the person she spoke to states "they will get it to her when they can", she is completely out of medication. She also stated she is running low in her tizanidine and her bottle states there are no more refills. She is also running low on carvedilol but cannot tell if she has refills.  CPht Jill Simcox or I will follow up with HTA (insurance) and Dr. Ann Held, to try to have this issue resolved.   Once I have received financial documents, I will submit patient assistance applications to companies.  [name], [place] Management [phone]

## 2018-06-15 NOTE — Patient Outreach (Signed)
[facility]) Care Management  06/15/2018  Sheila [name] 07/30/1941 [id number]  Successful call to patient, HIPAA identifiers verified. Follow up call to patient to confirm refills needed and the pharmacy name and phone number. Patient stated that she uses [facility] & the meds are Protonix (completely out), Tizanidine & Carvedilol.  Successful call to Sheila [name] at [employer]. Sheila [name] stated that patient was 1 week early on her Protonix and that she would try and get it on the delivery for today. Sheila [name] also stated [name] had a prescription on hold for Tizanidine and would start the process of refilling the medication but that she did not have any rxs on file for Carvedilol.   A call was placed to [name]'s doctor Ann Held) where a voicemail was left on the nurse's line requesting that the office phone in a new rx to [facility] for the Carvedilol.   Unsucessful call back to patient where a HIPAA compliant voicemail was left requesting a call back to relay the information from [facility].   Will make 2nd call attempt in 2-3 business days if call has not been returned.  Kaylib Furness P. Ernestine [place], [place] Management [phone]

## 2018-06-16 ENCOUNTER — Other Ambulatory Visit: Payer: Self-pay | Admitting: Pharmacy Technician

## 2018-06-16 NOTE — Patient Outreach (Signed)
[place] [facility]) Care Management  06/16/2018  Sheila [name] 1941-05-21 [id number]   Successful call to Sheila [name], HIPAA identifiers verified. Followup call to inquire if patient had received the protonix from [facility]. Patient stated that she had received the medication. Informed patient that Sheila [name] at [facility] was asked to also fill the Tizanidine and that a call was placed to Sheila [name] office to phone in a new rx to [facility] for the Carvedilol. Patient was told to expect those meds sometime in the next week from Sheila [name].   Verified patient had our name and number if she had any other questions.  Sheila [name] P. Rayhaan Huster, [place] Management [phone]

## 2018-06-18 ENCOUNTER — Other Ambulatory Visit: Payer: Self-pay | Admitting: Pharmacy Technician

## 2018-06-18 NOTE — Patient Outreach (Signed)
[place]) Care Management  06/18/2018  Sheila [name] [birth year] [id number]   Received patients financial documents in the mail. Prepared [employer] patient assistance application to be mailed to [employer].   Contacted Dr. Aviva Signs office to have Alphagan P switched from 0.15% to 0.1%. [name] states that it was ok to switch on the Allergan patient assistance application. Faxed completed application as well as required documents into company.  Will follow up with both companies in 10-14 business days to check status of applications.  Maud Deed [place], [place] Management [phone]

## 2018-06-21 DIAGNOSIS — Z6827 Body mass index (BMI) 27.0-27.9, adult: Secondary | ICD-10-CM | POA: Diagnosis not present

## 2018-06-21 DIAGNOSIS — R0789 Other chest pain: Secondary | ICD-10-CM | POA: Diagnosis not present

## 2018-06-21 DIAGNOSIS — K219 Gastro-esophageal reflux disease without esophagitis: Secondary | ICD-10-CM | POA: Diagnosis not present

## 2018-06-21 DIAGNOSIS — Z23 Encounter for immunization: Secondary | ICD-10-CM | POA: Diagnosis not present

## 2018-06-21 DIAGNOSIS — R0989 Other specified symptoms and signs involving the circulatory and respiratory systems: Secondary | ICD-10-CM | POA: Diagnosis not present

## 2018-06-22 DIAGNOSIS — H9311 Tinnitus, right ear: Secondary | ICD-10-CM | POA: Diagnosis not present

## 2018-06-22 DIAGNOSIS — H906 Mixed conductive and sensorineural hearing loss, bilateral: Secondary | ICD-10-CM | POA: Diagnosis not present

## 2018-06-25 ENCOUNTER — Other Ambulatory Visit: Payer: Self-pay | Admitting: *Deleted

## 2018-06-25 NOTE — Patient Outreach (Signed)
[place] [facility]) Care Management  06/25/2018  TONNI MANSOUR 12/17/1940 [id number]   RN Health Coach Monthly Outreach  Referral Date: 4/11/219 Referral Source: [facility] Reason for Referral: Medication Assistance/Disease Management Education Insurance: Health Team Advantage   Outreach Attempt:  Outreach attempt #1 to patient for monthly follow up. No answer. RN Health Coach left HIPAA compliant voicemail message along with contact information.  Plan:  RN Health Coach will attempt another outreach within the month of October.  [place] [phone] [email or web address]

## 2018-06-30 DIAGNOSIS — H402222 Chronic angle-closure glaucoma, left eye, moderate stage: Secondary | ICD-10-CM | POA: Diagnosis not present

## 2018-07-02 ENCOUNTER — Other Ambulatory Visit: Payer: Self-pay | Admitting: Pharmacy Technician

## 2018-07-02 DIAGNOSIS — R079 Chest pain, unspecified: Secondary | ICD-10-CM

## 2018-07-02 HISTORY — DX: Chest pain, unspecified: R07.9

## 2018-07-02 NOTE — Patient Outreach (Signed)
[place] [facility]) Care Management  07/02/2018  Sheila [name] February 28, 1941 [id number]  Call placed to [place] patient assistance to inquire about the status of the application.  Using the automated system, the Patient has been denied for Alphagan P made by [employer]er[employer] due to patient not meeting program requirements.  Will route note to [facility] CPhT Etter Sjogren for followup.  [name] P. Beverley Sherrard, [place] Management [phone]

## 2018-07-05 ENCOUNTER — Other Ambulatory Visit: Payer: Self-pay | Admitting: Pharmacy Technician

## 2018-07-05 NOTE — Patient Outreach (Signed)
[place] [facility]) Care Management  07/05/2018  SEAIRRA OTANI 03-24-1941 [id number]  Successful outreach call placed to [employer] patient assistance to inquire about the status of patient's medication assistance application for Trusopt.  Spoke to [place] who said the application has not been received into their system yet. She stated it takes 3-5 business days once they receive the application in the mail for it to be added to their system. She suggested calling back in a few days.  Will route note to [place] for followup in 2-3 business days.  Dazha Kempa P. Elisha Mcgruder, [place] Management [phone]

## 2018-07-08 ENCOUNTER — Other Ambulatory Visit: Payer: Self-pay | Admitting: *Deleted

## 2018-07-08 ENCOUNTER — Other Ambulatory Visit: Payer: Self-pay | Admitting: Pharmacy Technician

## 2018-07-08 DIAGNOSIS — R0989 Other specified symptoms and signs involving the circulatory and respiratory systems: Secondary | ICD-10-CM | POA: Diagnosis not present

## 2018-07-08 DIAGNOSIS — K219 Gastro-esophageal reflux disease without esophagitis: Secondary | ICD-10-CM | POA: Diagnosis not present

## 2018-07-08 NOTE — Patient Outreach (Signed)
[place] [facility]) Care Management  07/08/2018  BRIANDA BEITLER 16-Jan-1941 [id number]   RN Health Coach Monthly Outreach  Referral Date: 4/11/219 Referral Source: [facility] Reason for Referral: Medication Assistance/Disease Management Education Insurance: Health Team Advantage   Outreach Attempt:  Outreach attempt #2 to patient for monthly follow up. No answer. RN Health Coach left HIPAA compliant voicemail message along with contact information.  Plan:  RN Health Coach will attempt another outreach to patient within the month of November.  [place] [phone] [email or web address] [email or web address]

## 2018-07-08 NOTE — Patient Outreach (Signed)
[place] [facility]) Care Management  07/08/2018  Sheila [name] [birth year] [id number]   Care coordination call placed to Merck patient assistance to inquire if they had received the patient's medication assistance application and subsequently mailed out the attestation letter.  Spoke to [place] who said they had received the application and mailed back the attestation letter on 09/30/17.  Will route note to [place] for followup.  Sheila [name] [name], [place] Management [phone]

## 2018-07-09 ENCOUNTER — Other Ambulatory Visit: Payer: Self-pay | Admitting: Pharmacy Technician

## 2018-07-09 NOTE — Patient Outreach (Signed)
[place] [facility]) Care Management  07/09/2018  TYRAH BROERS 06-24-1941 [id number]   Unsuccessful call to patient, HIPAA compliant voicemail left. Called in regards to [place] letter that has been mailed to patient from [employer] patient assistance.  Will make 2nd call attempt in 2-3 business days if call has not been returned.  Maud Deed [place] Cord Certified Pharmacy Technician [facility] Care Management [phone]

## 2018-07-14 ENCOUNTER — Ambulatory Visit: Payer: PPO | Admitting: Pharmacy Technician

## 2018-07-14 ENCOUNTER — Other Ambulatory Visit: Payer: Self-pay | Admitting: Pharmacy Technician

## 2018-07-14 NOTE — Patient Outreach (Signed)
[place] [facility]) Care Management  07/14/2018  Sheila [name] 03-Jan-1941 [id number]   Unsuccessful call #2 in regards to attestation letter that was mailed to patient from [place].  Will make 3rd call attempt in 2-3 business days if call has not been returned.  Maud Deed Chana Bode [place] Certified Pharmacy Technician [place] Management Direct Dial:[phone]

## 2018-07-15 ENCOUNTER — Encounter: Payer: Self-pay | Admitting: Cardiology

## 2018-07-15 ENCOUNTER — Ambulatory Visit (INDEPENDENT_AMBULATORY_CARE_PROVIDER_SITE_OTHER): Payer: PPO | Admitting: Cardiology

## 2018-07-15 VITALS — BP 108/58 | HR 76 | Ht 63.0 in | Wt 160.0 lb

## 2018-07-15 DIAGNOSIS — I2510 Atherosclerotic heart disease of native coronary artery without angina pectoris: Secondary | ICD-10-CM | POA: Diagnosis not present

## 2018-07-15 DIAGNOSIS — R079 Chest pain, unspecified: Secondary | ICD-10-CM | POA: Diagnosis not present

## 2018-07-15 DIAGNOSIS — I10 Essential (primary) hypertension: Secondary | ICD-10-CM

## 2018-07-15 DIAGNOSIS — I208 Other forms of angina pectoris: Secondary | ICD-10-CM | POA: Diagnosis not present

## 2018-07-15 DIAGNOSIS — E785 Hyperlipidemia, unspecified: Secondary | ICD-10-CM

## 2018-07-15 DIAGNOSIS — I2089 Other forms of angina pectoris: Secondary | ICD-10-CM

## 2018-07-15 HISTORY — DX: Other forms of angina pectoris: I20.8

## 2018-07-15 HISTORY — DX: Other forms of angina pectoris: I20.89

## 2018-07-15 MED ORDER — NITROGLYCERIN 0.4 MG SL SUBL: 0.4000 mg | SUBLINGUAL_TABLET | SUBLINGUAL | 11 refills | Status: DC | PRN

## 2018-07-15 MED ORDER — AMLODIPINE BESYLATE 5 MG PO TABS: 5.0000 mg | ORAL_TABLET | Freq: Every day | ORAL | 0 refills | Status: DC

## 2018-07-15 NOTE — Patient Instructions (Signed)
Medication Instructions:  Your physician has recommended you make the following change in your medication:   START norvasc 2.5 mg (1/2 tab) daily for 1 week then increase to 5 mg (1 tab) daily STOP take 0.1 mg clonidine daily for 1 week then discontinue  If you need a refill on your cardiac medications before your next appointment, please call your pharmacy.   Lab work: None  If you have labs (blood work) drawn today and your tests are completely normal, you will receive your results only by: Marland Kitchen MyChart Message (if you have MyChart) OR . A paper copy in the mail If you have any lab test that is abnormal or we need to change your treatment, we will call you to review the results.  Testing/Procedures: Your physician has requested that you have an echocardiogram. Echocardiography is a painless test that uses sound waves to create images of your heart. It provides your doctor with information about the size and shape of your heart and how well your heart's chambers and valves are working. This procedure takes approximately one hour. There are no restrictions for this procedure.  Follow-Up: At [facility], you and your health needs are our priority.  As part of our continuing mission to provide you with exceptional heart care, we have created designated Provider Care Teams.  These Care Teams include your primary Cardiologist (physician) and Advanced Practice Providers (APPs -  Physician Assistants and Nurse Practitioners) who all work together to provide you with the care you need, when you need it.  You will need a follow up appointment in 6 months.  Please call our office 2 months in advance to schedule this appointment.  You may see or another member of our [employer] Provider Team in [place]: Jenne Campus, MD . Shirlee More, MD  Any Other Special Instructions Will Be Listed Below (If Applicable).  Amlodipine tablets What is this medicine? AMLODIPINE (am LOE di peen) is a  calcium-channel blocker. It affects the amount of calcium found in your heart and muscle cells. This relaxes your blood vessels, which can reduce the amount of work the heart has to do. This medicine is used to lower high blood pressure. It is also used to prevent chest pain. This medicine may be used for other purposes; ask your health care provider or pharmacist if you have questions. COMMON BRAND NAME(S): Norvasc What should I tell my health care provider before I take this medicine? They need to know if you have any of these conditions: -heart problems like heart failure or aortic stenosis -liver disease -an unusual or allergic reaction to amlodipine, other medicines, foods, dyes, or preservatives -pregnant or trying to get pregnant -breast-feeding How should I use this medicine? Take this medicine by mouth with a glass of water. Follow the directions on the prescription label. Take your medicine at regular intervals. Do not take more medicine than directed. Talk to your pediatrician regarding the use of this medicine in children. Special care may be needed. This medicine has been used in children as young as 6. Persons over 48 years old may have a stronger reaction to this medicine and need smaller doses. Overdosage: If you think you have taken too much of this medicine contact a poison control center or emergency room at once. NOTE: This medicine is only for you. Do not share this medicine with others. What if I miss a dose? If you miss a dose, take it as soon as you can. If it is  almost time for your next dose, take only that dose. Do not take double or extra doses. What may interact with this medicine? -herbal or dietary supplements -local or general anesthetics -medicines for high blood pressure -medicines for prostate problems -rifampin This list may not describe all possible interactions. Give your health care provider a list of all the medicines, herbs, non-prescription drugs, or  dietary supplements you use. Also tell them if you smoke, drink alcohol, or use illegal drugs. Some items may interact with your medicine. What should I watch for while using this medicine? Visit your doctor or health care professional for regular check ups. Check your blood pressure and pulse rate regularly. Ask your health care professional what your blood pressure and pulse rate should be, and when you should contact him or her. This medicine may make you feel confused, dizzy or lightheaded. Do not drive, use machinery, or do anything that needs mental alertness until you know how this medicine affects you. To reduce the risk of dizzy or fainting spells, do not sit or stand up quickly, especially if you are an older patient. Avoid alcoholic drinks; they can make you more dizzy. Do not suddenly stop taking amlodipine. Ask your doctor or health care professional how you can gradually reduce the dose. What side effects may I notice from receiving this medicine? Side effects that you should report to your doctor or health care professional as soon as possible: -allergic reactions like skin rash, itching or hives, swelling of the face, lips, or tongue -breathing problems -changes in vision or hearing -chest pain -fast, irregular heartbeat -swelling of legs or ankles Side effects that usually do not require medical attention (report to your doctor or health care professional if they continue or are bothersome): -dry mouth -facial flushing -nausea, vomiting -stomach gas, pain -tired, weak -trouble sleeping This list may not describe all possible side effects. Call your doctor for medical advice about side effects. You may report side effects to FDA at 1-800-FDA-1088. Where should I keep my medicine? Keep out of the reach of children. Store at room temperature between 59 and 86 degrees F (15 and 30 degrees C). Protect from light. Keep container tightly closed. Throw away any unused medicine after  the expiration date. NOTE: This sheet is a summary. It may not cover all possible information. If you have questions about this medicine, talk to your doctor, pharmacist, or health care provider.  2018 Elsevier/Gold Standard (2012-07-23 11:40:58)

## 2018-07-15 NOTE — Progress Notes (Signed)
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 Chest pain, unspecified type   3. Coronary artery disease involving native coronary artery of native heart without angina pectoris   4. Essential hypertension   5. Dyslipidemia    PLAN:    In order of problems listed above:  1. Secondary prevention stressed with the patient.  Importance of compliance with diet and medication stressed and she vocalized understanding.  Her lipids are followed by her primary care physician. 2. In view of her symptoms, I made the following adjustments in her medications.  I feel she might have an element of microvascular dysfunction giving her symptoms of angina.  I have asked her to cut down her clonidine to 0.1 mg every day for a week.  Subsequently she will discontinue this.  I am tapering her medications down for fear of rebound hypertension.  Also beginning today she will start amlodipine 5 mg tablet half tablet daily and will increase it to 1 tablet in a week.  She will be back in 2 weeks for a pulse blood pressure check.  Echocardiogram will be done to assess murmur heard on auscultation.  She knows to go to the nearest emergency room for any concerning symptoms. 3. Patient will be seen in follow-up appointment in 6 months or earlier if the patient has any concerns    Medication Adjustments/Labs and Tests Ordered: Current medicines are reviewed at length with the patient today.  Concerns regarding medicines are outlined above.  No orders of the defined types were placed in this encounter.  No orders of the defined types were placed in this encounter.    No chief complaint on file.    History of Present Illness:    Sheila [name] is a 77 y.o. female.  The patient has past medical history of  nonobstructive coronary artery disease by coronary angiography done a few months ago.  She has history of essential hypertension and dyslipidemia.  She mentions to me that occasionally she has chest tightness and is concerned about it.  Her primary care referral for this reason.  At the time of my evaluation, the patient is alert awake oriented and in no distress.  She is under going a little bit of a stressful situation at home with issues relating to her daughter.  At the time of my evaluation, the patient is alert awake oriented and in no distress.  Past Medical History:  Diagnosis Date  . Allergy   . Anxiety   . Arthritis   . Asthma   . Blood transfusion without reported diagnosis   . Cataract   . Depression   . GERD (gastroesophageal reflux disease)   . Glaucoma   . Heart murmur   . Hyperlipidemia   . Hypertension     Past Surgical History:  Procedure Laterality Date  . CESAREAN SECTION    . EYE SURGERY    . LEFT HEART CATH AND CORONARY ANGIOGRAPHY N/A 12/28/2017   Procedure: LEFT HEART CATH AND CORONARY ANGIOGRAPHY;  Surgeon: Belva Crome, MD;  Location: [facility];  Service: Cardiovascular;  Laterality: N/A;  . MASTOIDECTOMY    . PATENT DUCTUS ARTERIOUS REPAIR    . SHOULDER OPEN ROTATOR  CUFF REPAIR Bilateral     Current Medications: Current Meds  Medication Sig  . acetaminophen (TYLENOL) 500 MG tablet Take 500 mg by mouth every 6 (six) hours as needed.  . ALPRAZolam (XANAX) 0.25 MG tablet Take 0.25 mg by mouth 2 (two) times daily as needed for anxiety.  Marland Kitchen aspirin EC 81 MG tablet Take 81 mg by mouth daily.  . brimonidine (ALPHAGAN) 0.15 % ophthalmic solution Place 1 drop into both eyes every 12 (twelve) hours.   . carvedilol (COREG) 6.25 MG tablet Take 6.25 mg by mouth 2 (two) times daily with a meal.  . cloNIDine (CATAPRES) 0.1 MG tablet Take 0.1 mg by mouth 2 (two) times daily.  . Coenzyme Q10 (CO Q-10) 200 MG CAPS Take 1 tablet by mouth every other day.  .  dorzolamide (TRUSOPT) 2 % ophthalmic solution Place 1 drop into both eyes daily.   . furosemide (LASIX) 40 MG tablet Take 40 mg by mouth daily as needed.  Marland Kitchen ibuprofen (ADVIL,MOTRIN) 200 MG tablet Take 200 mg by mouth every 6 (six) hours as needed for headache.  . meclizine (ANTIVERT) 25 MG tablet Take 25 mg by mouth 3 (three) times daily as needed for dizziness.  . neomycin-polymyxin-hydrocortisone (CORTISPORIN) OTIC solution Place 3 drops into the right ear 3 (three) times daily.  . pantoprazole (PROTONIX) 20 MG tablet Take 40 mg by mouth daily.   . ranitidine (ZANTAC) 300 MG capsule Take 300 mg by mouth every evening.  . rosuvastatin (CRESTOR) 10 MG tablet Take 10 mg by mouth every Monday, Wednesday, and Friday.  . sodium chloride (MURO 128) 2 % ophthalmic solution Place 1 drop into both eyes at bedtime.  . sucralfate (CARAFATE) 1 g tablet Take 1 g by mouth 4 (four) times daily -  with meals and at bedtime.   . timolol (BETIMOL) 0.5 % ophthalmic solution Place 1 drop into both eyes 2 (two) times daily.   Marland Kitchen tiZANidine (ZANAFLEX) 2 MG tablet Take 2 mg by mouth at bedtime.     Allergies:   Antihistamines, chlorpheniramine-type; Ativan [lorazepam]; Ceftin [cefuroxime axetil]; Meloxicam; Pheneen [benzalkonium chloride]; and Morphine   Social History   Socioeconomic History  . Marital status: Divorced    Spouse name: Not on file  . Number of children: Not on file  . Years of education: Not on file  . Highest education level: Not on file  Occupational History  . Not on file  Social Needs  . Financial resource strain: Not on file  . Food insecurity:    Worry: Not on file    Inability: Not on file  . Transportation needs:    Medical: Not on file    Non-medical: Not on file  Tobacco Use  . Smoking status: Never Smoker  . Smokeless tobacco: Never Used  Substance and Sexual Activity  . Alcohol use: No  . Drug use: No  . Sexual activity: Not on file  Lifestyle  . Physical activity:     Days per week: Not on file    Minutes per session: Not on file  . Stress: Not on file  Relationships  . Social connections:    Talks on phone: Not on file    Gets together: Not on file    Attends religious service: Not on file    Active member of club or organization: Not on file    Attends meetings of clubs or organizations: Not on file    Relationship status: Not on file  Other Topics  Concern  . Not on file  Social History Narrative  . Not on file     Family History: The patient's family history includes Cancer in her brother; Heart attack in her mother and sister.  ROS:   Please see the history of present illness.    All other systems reviewed and are negative.  EKGs/Labs/Other Studies Reviewed:    The following studies were reviewed today: I discussed my findings with the patient at extensive length.  EKG reveals sinus rhythm with left ventricular hypertrophy.   Recent Labs: 12/28/2017: Hemoglobin 13.3 02/04/2018: BUN 16; Creatinine, Ser 0.67; Potassium 4.2; Sodium 141  Recent Lipid Panel No results found for: CHOL, TRIG, HDL, CHOLHDL, VLDL, LDLCALC, LDLDIRECT  Physical Exam:    VS:  BP (!) 108/58 (BP Location: Left Arm, Patient Position: Sitting)   Pulse 76   Ht 5\' 3"  (1.6 m)   Wt 160 lb (72.6 kg)   SpO2 98%   BMI 28.34 kg/m     Wt Readings from Last 3 Encounters:  07/15/18 160 lb (72.6 kg)  04/08/18 157 lb (71.2 kg)  02/04/18 158 lb (71.7 kg)     GEN: Patient is in no acute distress HEENT: Normal NECK: No JVD; No carotid bruits LYMPHATICS: No lymphadenopathy CARDIAC: Hear sounds regular, 2/6 systolic murmur at the apex. RESPIRATORY:  Clear to auscultation without rales, wheezing or rhonchi  ABDOMEN: Soft, non-tender, non-distended MUSCULOSKELETAL:  No edema; No deformity  SKIN: Warm and dry NEUROLOGIC:  Alert and oriented x 3 PSYCHIATRIC:  Normal affect   Signed, Jenean Lindau, MD  07/15/2018 11:09 AM    [place]

## 2018-07-16 ENCOUNTER — Encounter: Payer: Self-pay | Admitting: *Deleted

## 2018-07-16 ENCOUNTER — Other Ambulatory Visit: Payer: Self-pay | Admitting: *Deleted

## 2018-07-16 NOTE — Patient Outreach (Addendum)
[facility]) Care Management  [place]  07/16/2018   Sheila [name] November 19, 1940 [id number]   RN Health Coach Monthly Outreach   Referral Date:  4/11/219 Referral Source:  [facility] Reason for Referral:  Medication Assistance/Disease Management Education Insurance:  Health Team Advantage    Outreach Attempt:  Successful telephone outreach to patient for monthly follow up. HIPAA verified with patient.  Patient reporting she feels much better.  States she has not had a nose bleed since in the physicians office and states her headaches are much better with a decrease in occurrence and intensity and ear pain is resolved.  Does report some increase in shortness of breath and abdominal bloating at times.  Has seen Cardiologist and planning echocardiogram in December.  Also reporting SBP up to 220 in the past few weeks.  Blood pressure medications being adjusted for possible rebound hypertension.  Reviewed medication adjustment instructions with patient.  Patient verbalizing correct titration off of clonidine and correct starting on Norvasc.  Discussed with patient weighing herself daily to monitor fluid status with abdominal bloating.  Encouraged patient to return call back to [place] with medication assistance applications.  Encounter Medications:  Outpatient Encounter Medications as of 07/16/2018  Medication Sig Note  . acetaminophen (TYLENOL) 500 MG tablet Take 500 mg by mouth every 6 (six) hours as needed.   Marland Kitchen amLODipine (NORVASC) 5 MG tablet Take 1 tablet (5 mg total) by mouth daily. 07/16/2018: Taking 1/2 tablet daily for 1 week  . aspirin EC 81 MG tablet Take 81 mg by mouth daily.   . brimonidine (ALPHAGAN) 0.15 % ophthalmic solution Place 1 drop into both eyes every 12 (twelve) hours.    . carvedilol (COREG) 6.25 MG tablet Take 6.25 mg by mouth 2 (two) times daily with a meal.   . Coenzyme Q10 (CO Q-10) 200 MG CAPS Take 1 tablet by mouth every other  day.   . dorzolamide (TRUSOPT) 2 % ophthalmic solution Place 1 drop into both eyes daily.    . furosemide (LASIX) 40 MG tablet Take 40 mg by mouth daily as needed.   Marland Kitchen ibuprofen (ADVIL,MOTRIN) 200 MG tablet Take 200 mg by mouth every 6 (six) hours as needed for headache.   . meclizine (ANTIVERT) 25 MG tablet Take 25 mg by mouth 3 (three) times daily as needed for dizziness.   . neomycin-polymyxin-hydrocortisone (CORTISPORIN) OTIC solution Place 3 drops into the right ear 3 (three) times daily.   . nitroGLYCERIN (NITROSTAT) 0.4 MG SL tablet Place 1 tablet (0.4 mg total) under the tongue every 5 (five) minutes as needed for chest pain.   . pantoprazole (PROTONIX) 20 MG tablet Take 40 mg by mouth daily.    . ranitidine (ZANTAC) 300 MG capsule Take 300 mg by mouth every evening.   . rosuvastatin (CRESTOR) 10 MG tablet Take 10 mg by mouth every Monday, Wednesday, and Friday.   . sodium chloride (MURO 128) 2 % ophthalmic solution Place 1 drop into both eyes at bedtime. 12/30/2017: Taking as needed per patient  . sucralfate (CARAFATE) 1 g tablet Take 1 g by mouth 4 (four) times daily -  with meals and at bedtime.    . timolol (BETIMOL) 0.5 % ophthalmic solution Place 1 drop into both eyes 2 (two) times daily.    Marland Kitchen tiZANidine (ZANAFLEX) 2 MG tablet Take 2 mg by mouth at bedtime.   . ALPRAZolam (XANAX) 0.25 MG tablet Take 0.25 mg by mouth 2 (two)  times daily as needed for anxiety. 12/29/2017: Only takes for procedures   No facility-administered encounter medications on file as of 07/16/2018.     Functional Status:  In your present state of health, do you have any difficulty performing the following activities: 12/30/2017  Hearing? Y  Comment bilateral hearing aides  Vision? N  Difficulty concentrating or making decisions? N  Walking or climbing stairs? N  Dressing or bathing? N  Doing errands, shopping? N  Preparing Food and eating ? N  Using the Toilet? N  In the past six months, have you  accidently leaked urine? N  Do you have problems with loss of bowel control? N  Managing your Medications? N  Managing your Finances? N  Housekeeping or managing your Housekeeping? N  Some recent data might be hidden    Fall/Depression Screening: Fall Risk  07/16/2018 04/08/2018 03/18/2018  Falls in the past year? 0 No No  Number falls in past yr: - - -  Injury with Fall? - - -  Risk Factor Category  - - -  Risk for fall due to : Impaired mobility;Impaired balance/gait;Medication side effect;Impaired vision - -  Follow up Falls prevention discussed;Education provided - -   PHQ 2/9 Scores 12/30/2017 12/17/2017 03/24/2017  PHQ - 2 Score 1 1 2   PHQ- 9 Score - - 5   THN CM Care Plan Problem One     Most Recent Value  Care Plan Problem One  Assistance with managing blood pressure control and weight loss.  Role Documenting the Problem One  [place] for Problem One  Active  [place] Long Term Goal   Patient will report no SBP >180 in the next 90 days  THN Long Term Goal Start Date  07/16/18  Interventions for Problem One Long Term Goal  Discussed and reviewed current care plan and goals with patient, reviewed medications and medication changes with patient, encouraged medication compliance, encouraged patient to discuss abdominal bloating with physician, encouraged patient to attend scheduled medical appointments, encouraged patient to continue to monitor blood pressures and notify physician for elevations, fall precautions and preventions reviewed, encouraged patient to utilize cane with all ambulation to assist with balance and preventing falls  THN CM Short Term Goal #1   Patient will have echocardiogram completed in the next 90 days  THN CM Short Term Goal #1 Start Date  07/16/18  [facility] CM Short Term Goal #1 Met Date  07/16/18  Interventions for Short Term Goal #1  Reviewed signs and symptoms of heart failure with patient, discussed current abdominal bloating and possibiltiy of fluid  retention, encouraged patient to weigh herself daily, discussed when to call physician based on weight, sending Calendar booklet to assist with vital sign documentation  THN CM Short Term Goal #2   Patient will report decrease in headache severity in the next 30 days  THN CM Short Term Goal #2 Start Date  05/28/18  [facility] CM Short Term Goal #2 Met Date  07/16/18     Appointments:  Patient saw provider at primary care office around 07/01/2018.  Attended Cardiology appointment on 07/15/2018 and has echocardiogram scheduled in December 2019.  Plan: RN Health Coach will send patient 2020 Calendar Booklet. RN Health Coach will send primary care provider Quarterly Update. RN Health Coach will make next telephone outreach to patient in the month of February.  [place] [phone] [email or web address]

## 2018-07-19 ENCOUNTER — Ambulatory Visit: Payer: Self-pay | Admitting: Pharmacy Technician

## 2018-07-19 ENCOUNTER — Other Ambulatory Visit: Payer: Self-pay | Admitting: Pharmacy Technician

## 2018-07-19 NOTE — Patient Outreach (Signed)
[place] [facility]) Care Management  07/19/2018  LORREE MILLAR 27-Jan-1941 [id number]   Unsuccessful call attempt #3 in regards to [employer] attestation letter mailed to patient for completion. HIPAA compliant voicemail left.  Will route note to [place] for case closure.  Maud Deed Chana Bode [place] Certified Pharmacy Technician [place] Management Direct [phone]

## 2018-07-20 ENCOUNTER — Other Ambulatory Visit: Payer: Self-pay

## 2018-07-20 NOTE — Patient Outreach (Addendum)
[place] [facility]) Care Management [place]  07/20/2018  THURLEY FRANCESCONI 1941/03/11 [id number]  Reason for referral: medication assistance  [facility] pharmacy case is being closed due to the following reasons:  Unable to establish or maintain contact with Ms. Blanchette.  Patient has been provided [facility] CM contact information if assistance needed in the future.    Thank you for allowing [facility] pharmacy to be involved in this patient's care.    Joetta Manners, PharmD Clinical Pharmacist [place] [phone]

## 2018-07-27 DIAGNOSIS — J0110 Acute frontal sinusitis, unspecified: Secondary | ICD-10-CM | POA: Diagnosis not present

## 2018-07-27 DIAGNOSIS — K219 Gastro-esophageal reflux disease without esophagitis: Secondary | ICD-10-CM | POA: Diagnosis not present

## 2018-07-27 DIAGNOSIS — Z6830 Body mass index (BMI) 30.0-30.9, adult: Secondary | ICD-10-CM | POA: Diagnosis not present

## 2018-07-27 DIAGNOSIS — R0989 Other specified symptoms and signs involving the circulatory and respiratory systems: Secondary | ICD-10-CM | POA: Diagnosis not present

## 2018-07-29 ENCOUNTER — Ambulatory Visit (INDEPENDENT_AMBULATORY_CARE_PROVIDER_SITE_OTHER): Payer: PPO | Admitting: Cardiology

## 2018-07-29 VITALS — BP 110/60 | HR 64

## 2018-07-29 DIAGNOSIS — I10 Essential (primary) hypertension: Secondary | ICD-10-CM | POA: Diagnosis not present

## 2018-07-29 NOTE — Progress Notes (Signed)
Patient presented today for BP check post d/c her clonidine and initiating norvasc. Patient states that she has been tolerating the medication well. BP and HR are stable today and patient appears well. Per Dr. Geraldo Pitter no changes to be made.

## 2018-08-02 DIAGNOSIS — Z0000 Encounter for general adult medical examination without abnormal findings: Secondary | ICD-10-CM | POA: Diagnosis not present

## 2018-08-07 DIAGNOSIS — Z79899 Other long term (current) drug therapy: Secondary | ICD-10-CM | POA: Diagnosis not present

## 2018-08-07 DIAGNOSIS — K219 Gastro-esophageal reflux disease without esophagitis: Secondary | ICD-10-CM | POA: Diagnosis not present

## 2018-08-07 DIAGNOSIS — E782 Mixed hyperlipidemia: Secondary | ICD-10-CM | POA: Diagnosis not present

## 2018-08-07 DIAGNOSIS — E118 Type 2 diabetes mellitus with unspecified complications: Secondary | ICD-10-CM | POA: Diagnosis not present

## 2018-08-11 DIAGNOSIS — Z6828 Body mass index (BMI) 28.0-28.9, adult: Secondary | ICD-10-CM | POA: Diagnosis not present

## 2018-08-11 DIAGNOSIS — R1013 Epigastric pain: Secondary | ICD-10-CM | POA: Diagnosis not present

## 2018-08-12 ENCOUNTER — Ambulatory Visit: Payer: PPO | Admitting: Neurology

## 2018-08-30 ENCOUNTER — Other Ambulatory Visit: Payer: PPO

## 2018-09-07 DIAGNOSIS — I10 Essential (primary) hypertension: Secondary | ICD-10-CM | POA: Diagnosis not present

## 2018-09-07 DIAGNOSIS — K222 Esophageal obstruction: Secondary | ICD-10-CM | POA: Diagnosis not present

## 2018-09-10 DIAGNOSIS — Z1231 Encounter for screening mammogram for malignant neoplasm of breast: Secondary | ICD-10-CM | POA: Diagnosis not present

## 2018-09-28 ENCOUNTER — Ambulatory Visit (INDEPENDENT_AMBULATORY_CARE_PROVIDER_SITE_OTHER): Payer: PPO

## 2018-09-28 DIAGNOSIS — I208 Other forms of angina pectoris: Secondary | ICD-10-CM

## 2018-09-28 NOTE — Progress Notes (Signed)
Complete echocardiogram has been performed.  Jimmy Rozena Fierro RDCS, RVT 

## 2018-09-29 ENCOUNTER — Telehealth: Payer: Self-pay

## 2018-09-29 NOTE — Telephone Encounter (Signed)
Patient called and notified of test results. 

## 2018-09-29 NOTE — Telephone Encounter (Signed)
-----   Message from Jenean Lindau, MD sent at 09/29/2018  9:01 AM EST ----- The results of the study is unremarkable. Please inform patient. I will discuss in detail at next appointment. Cc  primary care/referring physician Jenean Lindau, MD 09/29/2018 9:00 AM

## 2018-10-07 DIAGNOSIS — K219 Gastro-esophageal reflux disease without esophagitis: Secondary | ICD-10-CM | POA: Diagnosis not present

## 2018-10-07 DIAGNOSIS — E119 Type 2 diabetes mellitus without complications: Secondary | ICD-10-CM | POA: Diagnosis not present

## 2018-10-07 DIAGNOSIS — E782 Mixed hyperlipidemia: Secondary | ICD-10-CM | POA: Diagnosis not present

## 2018-10-15 ENCOUNTER — Other Ambulatory Visit: Payer: Self-pay | Admitting: *Deleted
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
## 2018-10-18 ENCOUNTER — Encounter: Payer: Self-pay | Admitting: Gastroenterology

## 2018-10-19 ENCOUNTER — Encounter: Payer: Self-pay | Admitting: Gastroenterology

## 2018-10-19 DIAGNOSIS — Z800 Family history of malignant neoplasm of digestive organs: Secondary | ICD-10-CM | POA: Diagnosis not present

## 2018-10-19 DIAGNOSIS — R1084 Generalized abdominal pain: Secondary | ICD-10-CM | POA: Diagnosis not present

## 2018-10-19 DIAGNOSIS — Z6829 Body mass index (BMI) 29.0-29.9, adult: Secondary | ICD-10-CM | POA: Diagnosis not present

## 2018-10-19 DIAGNOSIS — K219 Gastro-esophageal reflux disease without esophagitis: Secondary | ICD-10-CM | POA: Diagnosis not present

## 2018-10-25 ENCOUNTER — Encounter: Payer: Self-pay | Admitting: *Deleted

## 2018-10-25 ENCOUNTER — Other Ambulatory Visit: Payer: Self-pay | Admitting: *Deleted

## 2018-10-25 NOTE — Patient Outreach (Signed)
[facility]) Care Management  [place]  10/25/2018   Sheila [name] [birth year] [id number]   [facility] Quarterly Outreach   Referral Date:  4/11/219 Referral Source:  [place] Reason for Referral:  Medication Assistance/Disease Management Education Insurance:  [employer]   Outreach Attempt:  Successful telephone outreach to patient for quarterly follow up.  HIPAA verified with patient.  Patient states she is doing much better.  Reporting her blood pressure is better controlled.  Has not had to take prn blood pressure medication in a while.  Denies any recent headaches or shortness of breath.  Patient does report a lot of abdominal bloating with constipation.  Reports her appetite is decreased and eats minimal throughout the day due to bloating.  Discussed importance of eating small meals.  States she has an upcoming gastroenterology appointment.  Patient stating she is now having to pay co pays for her eye drops and they are starting to add up and becoming difficult to afford.  Discussed [place] had been trying to reach patient in November and encouraged patient to answer when [facility] trying to contact.  [place] referral placed again.  Patient stating she uses [facility] for her prescriptions.  Encounter Medications:  Outpatient Encounter Medications as of 10/25/2018  Medication Sig Note  . acetaminophen (TYLENOL) 500 MG tablet Take 500 mg by mouth every 6 (six) hours as needed.   Marland Kitchen aspirin EC 81 MG tablet Take 81 mg by mouth daily.   . brimonidine (ALPHAGAN) 0.15 % ophthalmic solution Place 1 drop into both eyes every 12 (twelve) hours.    . carvedilol (COREG) 6.25 MG tablet Take 6.25 mg by mouth 2 (two) times daily with a meal.   . Coenzyme Q10 (CO Q-10) 200 MG CAPS Take 1 tablet by mouth every other day.   . dorzolamide (TRUSOPT) 2 % ophthalmic solution Place 1 drop into both eyes daily.    . furosemide (LASIX) 40 MG tablet Take 40 mg  by mouth daily as needed.   Marland Kitchen ibuprofen (ADVIL,MOTRIN) 200 MG tablet Take 200 mg by mouth every 6 (six) hours as needed for headache.   . meclizine (ANTIVERT) 25 MG tablet Take 25 mg by mouth 3 (three) times daily as needed for dizziness.   . neomycin-polymyxin-hydrocortisone (CORTISPORIN) OTIC solution Place 3 drops into the right ear 3 (three) times daily.   . pantoprazole (PROTONIX) 20 MG tablet Take 40 mg by mouth daily.    . Prucalopride Succinate (MOTEGRITY) 1 MG TABS Take 1 capsule by mouth daily.   . ranitidine (ZANTAC) 300 MG capsule Take 300 mg by mouth every evening.   . rosuvastatin (CRESTOR) 10 MG tablet Take 10 mg by mouth every Monday, Wednesday, and Friday.   . sodium chloride (MURO 128) 2 % ophthalmic solution Place 1 drop into both eyes at bedtime. 12/30/2017: Taking as needed per patient  . sucralfate (CARAFATE) 1 g tablet Take 1 g by mouth 4 (four) times daily -  with meals and at bedtime.    . timolol (BETIMOL) 0.5 % ophthalmic solution Place 1 drop into both eyes 2 (two) times daily.    Marland Kitchen tiZANidine (ZANAFLEX) 2 MG tablet Take 2 mg by mouth at bedtime.   . ALPRAZolam (XANAX) 0.25 MG tablet Take 0.25 mg by mouth 2 (two) times daily as needed for anxiety. 12/29/2017: Only takes for procedures  . amLODipine (NORVASC) 5 MG tablet Take 1 tablet (5 mg total) by mouth daily.  10/25/2018: Continues to take 5 mg daily  . nitroGLYCERIN (NITROSTAT) 0.4 MG SL tablet Place 1 tablet (0.4 mg total) under the tongue every 5 (five) minutes as needed for chest pain.    No facility-administered encounter medications on file as of 10/25/2018.     Functional Status:  In your present state of health, do you have any difficulty performing the following activities: 12/30/2017  Hearing? Y  Comment bilateral hearing aides  Vision? N  Difficulty concentrating or making decisions? N  Walking or climbing stairs? N  Dressing or bathing? N  Doing errands, shopping? N  Preparing Food and eating ? N   Using the Toilet? N  In the past six months, have you accidently leaked urine? N  Do you have problems with loss of bowel control? N  Managing your Medications? N  Managing your Finances? N  Housekeeping or managing your Housekeeping? N  Some recent data might be hidden    Fall/Depression Screening: Fall Risk  10/25/2018 07/16/2018 04/08/2018  Falls in the past year? 0 0 No  Number falls in past yr: - - -  Injury with Fall? - - -  Risk Factor Category  - - -  Risk for fall due to : Impaired vision;Medication side effect;Impaired balance/gait;Impaired mobility Impaired mobility;Impaired balance/gait;Medication side effect;Impaired vision -  Follow up Falls evaluation completed;Falls prevention discussed;Education provided Falls prevention discussed;Education provided -   PHQ 2/9 Scores 12/30/2017 12/17/2017 03/24/2017  PHQ - 2 Score _0 PHQ- 9 Score - - 5   [facility] CM Care Plan Problem One     Most Recent Value  Care Plan Problem One  Assistance with managing blood pressure control and weight loss.  Role Documenting the Problem One  [place] for Problem One  Active  [facility] Long Term Goal   Patient will report no falls in the next 90 days.  [facility] Long Term Goal Start Date  10/25/18  [facility] Long Term Goal Met Date  10/25/18  Interventions for Problem One Long Term Goal  Reviewed and discussed current care plan and goals, encouraged patient to keep and attend medical appointments, fall precautions reviewed and discussed, encouraged patient to ambulate with walker or cane at all times, discussed and encouraged patient to monitor her blood pressures sitting and standing and to notify provider if blood pressures are lower standing, ,  [facility] referral placed for medication assistance (discussed difference from [facility] and encouraged patient to answer [facility] calls)  [facility] CM Short Term Goal #1   Patient will report a decrease in abdominal bloating in the next 90 days.  [facility] CM Short  Term Goal #1 Start Date  10/25/18  [facility] CM Short Term Goal #1 Met Date  10/25/18  Interventions for Short Term Goal #1  confirmed patient has upcoming GI appointment, encouraged patient to keep and attend appointment, reviewed medications and indications and encouraged medication compliance (especially with GI and reflux medications), discussed importance of eating small meals throughout the day     Appointments:  Reports her previous primary care provider, Dr. Nicki Reaper has retired and she now sees, Dr. Humphrey Rolls.  Attended appointment with Dr. Humphrey Rolls on 10/19/2018.  Has scheduled appointment with Gastroenterologist on 11/01/2018.  Plan: RN Health Coach will place [place] referral for possible medication assistance. RN Health Coach will send primary care provider Quarterly Update. RN Health Coach will make next telephone outreach to patient with in the month of May.  Hubert Azure RN  [place] [phone] [email or web address]

## 2018-10-28 ENCOUNTER — Telehealth: Payer: Self-pay | Admitting: Pharmacist

## 2018-10-28 NOTE — Patient Outreach (Signed)
Sheila [name]) Care Management  10/28/2018  Sheila [name], Sheila [name] [id number]   Patient was called regarding medication assistance. Unfortunately, she did not answer the phone. A HIPAA compliant message could not be left because the voicemail was full.   Patient's PCP Practice has a Soil scientist with [employer]. As such, [facility] Pharmacists are supposed to handoff medication needs to the [facility] pharmacist assigned the the practice.   A message was left for [facility] requesting a call back.  Plan: Call patient and Cecille Rubin back in 5-7 days if I do not hear back from her.   Blessings,  Elayne Guerin, PharmD, [place] Clinical Pharmacist [phone]

## 2018-10-29 ENCOUNTER — Encounter: Payer: Self-pay | Admitting: Gastroenterology

## 2018-11-01 ENCOUNTER — Ambulatory Visit (INDEPENDENT_AMBULATORY_CARE_PROVIDER_SITE_OTHER): Payer: PPO | Admitting: Gastroenterology

## 2018-11-01 ENCOUNTER — Encounter: Payer: Self-pay | Admitting: Gastroenterology

## 2018-11-01 VITALS — BP 134/74 | HR 73 | Ht 63.0 in | Wt 164.1 lb

## 2018-11-01 DIAGNOSIS — K219 Gastro-esophageal reflux disease without esophagitis: Secondary | ICD-10-CM

## 2018-11-01 DIAGNOSIS — K5900 Constipation, unspecified: Secondary | ICD-10-CM | POA: Diagnosis not present

## 2018-11-01 DIAGNOSIS — R1032 Left lower quadrant pain: Secondary | ICD-10-CM | POA: Diagnosis not present

## 2018-11-01 DIAGNOSIS — Z800 Family history of malignant neoplasm of digestive organs: Secondary | ICD-10-CM | POA: Diagnosis not present

## 2018-11-01 MED ORDER — SUPREP BOWEL PREP KIT 17.5-3.13-1.6 GM/177ML PO SOLN: 1.0000 | ORAL | 0 refills | Status: DC

## 2018-11-01 NOTE — Patient Instructions (Signed)
If you are age 77 or older, your body mass index should be between 23-30. Your There is no height or weight on file to calculate BMI. If this is out of the aforementioned range listed, please consider follow up with your Primary Care Provider.  If you are age 64 or younger, your body mass index should be between 19-25. Your There is no height or weight on file to calculate BMI. If this is out of the aformentioned range listed, please consider follow up with your Primary Care Provider.    You have been scheduled for an endoscopy and colonoscopy. Please follow the written instructions given to you at your visit today. Please pick up your prep supplies at the pharmacy within the next 1-3 days. If you use inhalers (even only as needed), please bring them with you on the day of your procedure. Your physician has requested that you go to www.startemmi.com and enter the access code given to you at your visit today. This web site gives a general overview about your procedure. However, you should still follow specific instructions given to you by our office regarding your preparation for the procedure.   Please purchase the following medications over the counter and take as directed: Miralax once daily followed by benefiber.   We have given you samples of the following medication to take: Suprep  Thank you,  Dr. Jackquline Denmark

## 2018-11-01 NOTE — Progress Notes (Addendum)
Chief Complaint: FU  Referring Provider:  Myer Peer, MD      ASSESSMENT AND PLAN;   #1. GERD with esophageal dysphagia with H/O Schatzki's ring s/p dil  01/2015.  #2. LLQ pain with neg CT at [facility] (report awaited.) #3. Constipation -likely exacerbated by medications like amlodipine.  Has underlying IBS. Has change in bowel habits. #4. FH colon cancer (brother with stage IV colon cancer at age 77)  Plan: - CT results from [facility]. - Miralax 17 g p.o. once a day until good bowel movement, followed by Benefiber 1 TBS p.o. once a day with 8 ounces of water. - She will get in touch with Dr Geraldo Pitter regarding Norvasc causing constipation. - Proceed with EGD with dil/colon. Discussed risks & benefits. (Risks including rare perforation req laparotomy, bleeding after biopsies/polypectomy req blood transfusion, rare chance of missing neoplasms, risks of anesthesia/sedation). Benefits outweigh the risks. Patient agrees to proceed. All the questions were answered. Consent forms given for review. - Continue Protonix 40 mg p.o. twice daily for now.  Chew food especially meats and breads well and eat slowly. - Avoid Nonsteroidals.  Addendum: CT chest at 18 2019-negative for acute abnormalities.  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 She did not have CT scan of the abdomen and pelvis.  Plan: Proceed with CT abdomen/pelvis with p.o. and IV contrast prior to endoscopic procedures.  HPI:    Sheila [name] is a 78 y.o. female  Has been having more constipation associated with abdominal bloating, left lower quadrant abdominal pain and left upper quadrant abdominal pain.  There has been more ever since she has been switched to Norvasc. Has been having intermittent problems swallowing especially solids. Has heartburn-Protonix has been increased to 40 mg p.o. twice daily.  She was also given a trial of Carafate.  No melena or hematochezia.  No weight loss.  Underwent CT scan of the  abdomen and pelvis at [facility] which according to the patient was unremarkable.  We do not have those records currently.  She also had normal labs including CBC, CMP August 16, 2018.  Records awaited.  Past GI procedures: -Colonoscopy 05/2015 ([facility])-moderate sigmoid diverticulosis. -EGD 01/17/2015 Schatzki's ring status post esophageal dilatation 45 Fr, small hiatal hernia Past Medical History:  Diagnosis Date  . Allergy   . Anxiety   . Arthritis   . Asthma   . Blood transfusion without reported diagnosis   . Cataract   . Chronic headaches   . Depression   . GERD (gastroesophageal reflux disease)   . Glaucoma   . Heart murmur   . Hyperlipidemia   . Hypertension     Past Surgical History:  Procedure Laterality Date  . CESAREAN SECTION    . COLONOSCOPY  05/16/2015   Moderate sigmoid diverticulosis.   Marland Kitchen EYE SURGERY    . LEFT HEART CATH AND CORONARY ANGIOGRAPHY N/A 12/28/2017   Procedure: LEFT HEART CATH AND CORONARY ANGIOGRAPHY;  Surgeon: Belva Crome, MD;  Location: [facility];  Service: Cardiovascular;  Laterality: N/A;  . MASTOIDECTOMY    . PATENT DUCTUS ARTERIOUS REPAIR    . SHOULDER OPEN ROTATOR CUFF REPAIR Bilateral     Family History  Problem Relation Age of Onset  . Heart attack Mother   . Heart attack Sister   . Colon cancer Brother   . Diabetes Brother   . Diabetes Daughter   . Stomach cancer Sister   . Esophageal cancer Neg Hx  Social History   Tobacco Use  . Smoking status: Never Smoker  . Smokeless tobacco: Never Used  Substance Use Topics  . Alcohol use: No  . Drug use: Never    Current Outpatient Medications  Medication Sig Dispense Refill  . ALPRAZolam (XANAX) 0.25 MG tablet Take 0.25 mg by mouth 2 (two) times daily as needed for anxiety.    Marland Kitchen amLODipine (NORVASC) 5 MG tablet Take 1 tablet (5 mg total) by mouth daily. 90 tablet 0  . aspirin EC 81 MG tablet Take 81 mg by mouth daily.    . brimonidine (ALPHAGAN) 0.15 %  ophthalmic solution Place 1 drop into both eyes every 12 (twelve) hours.     . carvedilol (COREG) 6.25 MG tablet Take 6.25 mg by mouth 2 (two) times daily with a meal.    . Coenzyme Q10 (CO Q-10) 200 MG CAPS Take 1 tablet by mouth daily.     . dorzolamide (TRUSOPT) 2 % ophthalmic solution Place 1 drop into both eyes daily.     . meclizine (ANTIVERT) 12.5 MG tablet Take 12.5 mg by mouth 4 (four) times daily as needed for dizziness.    . pantoprazole (PROTONIX) 40 MG tablet Take 40 mg by mouth 2 (two) times daily.    . ranitidine (ZANTAC) 300 MG capsule Take 300 mg by mouth at bedtime.     . rosuvastatin (CRESTOR) 10 MG tablet Take 10 mg by mouth every Monday, Wednesday, and Friday.    . sucralfate (CARAFATE) 1 g tablet Take 1 g by mouth 4 (four) times daily -  with meals and at bedtime.     . timolol (BETIMOL) 0.5 % ophthalmic solution Place 1 drop into both eyes 2 (two) times daily.     Marland Kitchen tiZANidine (ZANAFLEX) 2 MG tablet Take 2 mg by mouth at bedtime.    Marland Kitchen acetaminophen (TYLENOL) 500 MG tablet Take 500 mg by mouth every 6 (six) hours as needed.    . furosemide (LASIX) 40 MG tablet Take 40 mg by mouth daily as needed.  6  . ibuprofen (ADVIL,MOTRIN) 200 MG tablet Take 200 mg by mouth every 6 (six) hours as needed for headache.    . nitroGLYCERIN (NITROSTAT) 0.4 MG SL tablet Place 1 tablet (0.4 mg total) under the tongue every 5 (five) minutes as needed for chest pain. (Patient not taking: Reported on 11/01/2018) 25 tablet 11   No current facility-administered medications for this visit.     Allergies  Allergen Reactions  . Antihistamines, Chlorpheniramine-Type Anaphylaxis  . Ativan [Lorazepam] Swelling  . Ceftin [Cefuroxime Axetil]   . Meloxicam   . Pheneen [Benzalkonium Chloride]   . Morphine Palpitations    Review of Systems:  Neg except for HPI and Has sinus allergies, arthritis, back pain, vision changes, anxiety/depression, fatigue, headaches, hearing problems, excessive urination,  sleeping problems.  Had history of nosebleeds in the past which have resolved.     Physical Exam:    BP 134/74   Pulse 73  There were no vitals filed for this visit. Constitutional:  Well-developed, in no acute distress. Psychiatric: Normal mood and affect. Behavior is normal. HEENT: Pupils normal.  Conjunctivae are normal. No scleral icterus. Neck supple.  Cardiovascular: Normal rate, regular rhythm. No edema Pulmonary/chest: Effort normal and breath sounds normal. No wheezing, rales or rhonchi. Abdominal: Soft, nondistended. Nontender. Bowel sounds active throughout. There are no masses palpable. No hepatomegaly. Rectal:  defered Neurological: Alert and oriented to person place and time. Skin: Skin is  warm and dry. No rashes noted.  Data Reviewed: I have personally reviewed following labs and imaging studies  CBC: CBC Latest Ref Rng & Units 12/28/2017  Hemoglobin 12.0 - 15.0 g/dL 13.3  Hematocrit 36.0 - 46.0 % 39.0    CMP: CMP Latest Ref Rng & Units 02/04/2018 12/28/2017  Glucose 65 - 99 mg/dL 104(H) 103(H)  BUN 8 - 27 mg/dL 16 15  Creatinine 0.57 - 1.00 mg/dL 0.67 0.70  Sodium 134 - 144 mmol/L 141 141  Potassium 3.5 - 5.2 mmol/L 4.2 3.5  Chloride 96 - 106 mmol/L 103 101  CO2 20 - 29 mmol/L 24 -  Calcium 8.7 - 10.3 mg/dL 9.6 -     Carmell Austria, MD 11/01/2018, 2:49 PM  Cc: Myer Peer, MD

## 2018-11-03 ENCOUNTER — Other Ambulatory Visit: Payer: Self-pay | Admitting: Pharmacist

## 2018-11-03 NOTE — Patient Outreach (Signed)
[facility]) Care Management  11/03/2018  Sheila [name] 09-23-1940 [id number]   Patient was called back concerning medication assistance. HIPAA identifiers were obtained.  Patient is a 78 year old female with multiple medical conditions including but not limited to:  Chest pain, CAD, hyperlpidemia, GERD, hypertension, and headaches.    Patient expressed concerns over having to pay for her medications now because she was receiving them at no cost last year.   Patient is over income for [employer] and was receiving her medications at no cost from [employer].  She was concerned about her Eye drops:  Timolol, dorzolamide, and brimonidine  While we were on the phone, patient went back through her receipts and all of her eye drops had a $10 copay.  Unfortunately, that is the cheapest she will be able to get her eye drops.  Patient communicated understanding after reviewing her plan.  Her medications were reviewed via telephone:  Medications Reviewed Today    Reviewed by Elayne Guerin, [facility] (Pharmacist) on 11/03/18 at 1405  Med List Status: <None>  Medication Order Taking? Sig Documenting Provider Last Dose Status Informant  acetaminophen (TYLENOL) 500 MG tablet [id number] Yes Take 500 mg by mouth every 6 (six) hours as needed. [provider] Taking Active   ALPRAZolam Duanne Moron) 0.25 MG tablet [id number] Yes Take 0.25 mg by mouth 2 (two) times daily as needed for anxiety. [provider] Taking Active Self           Med Note Thurmond Butts, CAROLINE E   Tue Dec 29, 2017  4:55 PM) Only takes for procedures  amLODipine (NORVASC) 5 MG tablet [id number]  Take 1 tablet (5 mg total) by mouth daily. Jenean Lindau, MD  Expired 11/01/18 2359            Med Note Hubert Azure D   Mon Oct 25, 2018  3:31 PM) Continues to take 5 mg daily  aspirin EC 81 MG tablet [id number] Yes Take 81 mg by mouth daily. [provider] Taking Active Self  brimonidine (ALPHAGAN)  0.15 % ophthalmic solution [id number] Yes Place 1 drop into both eyes every 12 (twelve) hours.  [provider] Taking Active Self  carvedilol (COREG) 6.25 MG tablet [id number] Yes Take 6.25 mg by mouth 2 (two) times daily with a meal. [provider] Taking Active Self  Coenzyme Q10 (CO Q-10) 200 MG CAPS [id number] Yes Take 1 capsule by mouth daily.  [provider] Taking Active Self  dorzolamide (TRUSOPT) 2 % ophthalmic solution [id number] Yes Place 1 drop into both eyes daily.  [provider] Taking Active Self  fluticasone (FLONASE) 50 MCG/ACT nasal spray [id number] Yes Place 2 sprays into both nostrils 2 (two) times daily as needed. [provider] Taking Active   furosemide (LASIX) 40 MG tablet [id number] Yes Take 40 mg by mouth daily as needed. [provider] Taking Active            Med Note Elayne Guerin   Wed Nov 03, 2018  2:04 PM) Only when B/P is above 160  ibuprofen (ADVIL,MOTRIN) 200 MG tablet [id number] No Take 200 mg by mouth every 6 (six) hours as needed for headache. [provider] Not Taking Active Self  meclizine (ANTIVERT) 12.5 MG tablet [id number] Yes Take 12.5 mg by mouth 4 (four) times daily as needed for dizziness. [provider] Taking Active   nitroGLYCERIN (NITROSTAT) 0.4 MG SL tablet [id number]  Place 1 tablet (0.4 mg  total) under the tongue every 5 (five) minutes as needed for chest pain.  Patient not taking:  Reported on 11/01/2018   RevankarReita Cliche, MD  Expired 10/13/18 2359   pantoprazole (PROTONIX) 40 MG tablet [id number] Yes Take 40 mg by mouth 2 (two) times daily. [provider] Taking Active   ranitidine (ZANTAC) 300 MG capsule [id number] Yes Take 300 mg by mouth at bedtime.  [provider] Taking Active Self  rosuvastatin (CRESTOR) 10 MG tablet [id number] Yes Take 10 mg by mouth every Monday, Wednesday, and Friday. [provider] Taking Active Self  sucralfate (CARAFATE)  1 g tablet [id number] Yes Take 1 g by mouth 4 (four) times daily -  with meals and at bedtime.  [provider] Taking Active   SUPREP BOWEL PREP KIT 17.5-3.13-1.6 GM/177ML SOLN [id number] Yes Take 1 kit by mouth as directed. Jackquline Denmark, MD Taking Active   timolol (BETIMOL) 0.5 % ophthalmic solution [id number] Yes Place 1 drop into both eyes 2 (two) times daily.  [provider] Taking Active Self  tiZANidine (ZANAFLEX) 2 MG tablet [id number] Yes Take 2 mg by mouth at bedtime. [provider] Taking Active          Plan: Called Patient's PCP Office's Pharmacist for a soft hand off --Lelon Frohlich, PharmD as she is an Scientist, physiological and they have a contract with the PCP office and [place].    Patient communicated understanding and said she did not have anymore questions.    Close patient's pharmacy case.   Elayne Guerin, PharmD, [place] Clinical Pharmacist [phone]

## 2018-11-04 ENCOUNTER — Ambulatory Visit: Payer: Self-pay | Admitting: Pharmacist

## 2018-11-05 ENCOUNTER — Other Ambulatory Visit: Payer: Self-pay

## 2018-11-05 DIAGNOSIS — R1032 Left lower quadrant pain: Secondary | ICD-10-CM

## 2018-11-05 NOTE — Progress Notes (Signed)
Per Dr. Lyndel Safe patient needs a CT Abd/pelvis with contrast.  Patient is scheduled at the [facility] on 11/11/18 at [place]. I have notified patient she's going to come by the office to pick up instructions and contrast and to have labs drawn.

## 2018-11-06 DIAGNOSIS — K219 Gastro-esophageal reflux disease without esophagitis: Secondary | ICD-10-CM | POA: Diagnosis not present

## 2018-11-06 DIAGNOSIS — I10 Essential (primary) hypertension: Secondary | ICD-10-CM | POA: Diagnosis not present

## 2018-11-08 ENCOUNTER — Other Ambulatory Visit: Payer: Self-pay

## 2018-11-08 DIAGNOSIS — R1032 Left lower quadrant pain: Secondary | ICD-10-CM

## 2018-11-09 ENCOUNTER — Other Ambulatory Visit (INDEPENDENT_AMBULATORY_CARE_PROVIDER_SITE_OTHER): Payer: PPO

## 2018-11-09 DIAGNOSIS — R1032 Left lower quadrant pain: Secondary | ICD-10-CM | POA: Diagnosis not present

## 2018-11-09 LAB — COMPREHENSIVE METABOLIC PANEL
ALT: 10 U/L (ref 0–35)
AST: 11 U/L (ref 0–37)
Albumin: 4.2 g/dL (ref 3.5–5.2)
Alkaline Phosphatase: 84 U/L (ref 39–117)
BUN: 15 mg/dL (ref 6–23)
CHLORIDE: 102 meq/L (ref 96–112)
CO2: 30 mEq/L (ref 19–32)
Calcium: 9.3 mg/dL (ref 8.4–10.5)
Creatinine, Ser: 0.69 mg/dL (ref 0.40–1.20)
GFR: 99.8 mL/min (ref >60.00)
Glucose, Bld: 130 mg/dL — ABNORMAL HIGH (ref 70–99)
Potassium: 4 mEq/L (ref 3.5–5.1)
Sodium: 140 mEq/L (ref 135–145)
Total Bilirubin: 0.4 mg/dL (ref 0.2–1.2)
Total Protein: 6.6 g/dL (ref 6.0–8.3)

## 2018-11-11 ENCOUNTER — Encounter (HOSPITAL_BASED_OUTPATIENT_CLINIC_OR_DEPARTMENT_OTHER): Payer: Self-pay

## 2018-11-11 ENCOUNTER — Ambulatory Visit (HOSPITAL_BASED_OUTPATIENT_CLINIC_OR_DEPARTMENT_OTHER)
Admission: RE | Admit: 2018-11-11 | Discharge: 2018-11-11 | Disposition: A | Discharge status: Home / Self Care | Payer: PPO | Source: Ambulatory Visit | Attending: Gastroenterology | Admitting: Gastroenterology

## 2018-11-11 DIAGNOSIS — R1032 Left lower quadrant pain: Secondary | ICD-10-CM | POA: Insufficient documentation

## 2018-11-11 DIAGNOSIS — K5730 Diverticulosis of large intestine without perforation or abscess without bleeding: Secondary | ICD-10-CM | POA: Diagnosis not present

## 2018-11-11 MED ORDER — IOHEXOL 300 MG/ML  SOLN
100.0000 mL | Freq: Once | INTRAMUSCULAR | Status: AC | PRN
  Administered 2018-11-11: 100 mL via INTRAVENOUS

## 2018-11-15 ENCOUNTER — Other Ambulatory Visit: Payer: Self-pay

## 2018-11-15 ENCOUNTER — Encounter: Payer: Self-pay | Admitting: Gastroenterology

## 2018-11-15 ENCOUNTER — Ambulatory Visit (AMBULATORY_SURGERY_CENTER): Payer: PPO | Admitting: Gastroenterology

## 2018-11-15 VITALS — BP 149/77 | HR 63 | Temp 98.6°F | Resp 19

## 2018-11-15 DIAGNOSIS — K2980 Duodenitis without bleeding: Secondary | ICD-10-CM | POA: Diagnosis not present

## 2018-11-15 DIAGNOSIS — K222 Esophageal obstruction: Secondary | ICD-10-CM | POA: Diagnosis not present

## 2018-11-15 DIAGNOSIS — Z85038 Personal history of other malignant neoplasm of large intestine: Secondary | ICD-10-CM | POA: Diagnosis not present

## 2018-11-15 DIAGNOSIS — K449 Diaphragmatic hernia without obstruction or gangrene: Secondary | ICD-10-CM | POA: Diagnosis not present

## 2018-11-15 DIAGNOSIS — Z800 Family history of malignant neoplasm of digestive organs: Secondary | ICD-10-CM

## 2018-11-15 DIAGNOSIS — K5730 Diverticulosis of large intestine without perforation or abscess without bleeding: Secondary | ICD-10-CM | POA: Diagnosis not present

## 2018-11-15 DIAGNOSIS — K219 Gastro-esophageal reflux disease without esophagitis: Secondary | ICD-10-CM | POA: Diagnosis not present

## 2018-11-15 DIAGNOSIS — R1310 Dysphagia, unspecified: Secondary | ICD-10-CM

## 2018-11-15 DIAGNOSIS — K635 Polyp of colon: Secondary | ICD-10-CM

## 2018-11-15 DIAGNOSIS — K648 Other hemorrhoids: Secondary | ICD-10-CM | POA: Diagnosis not present

## 2018-11-15 DIAGNOSIS — R1032 Left lower quadrant pain: Secondary | ICD-10-CM

## 2018-11-15 DIAGNOSIS — K5900 Constipation, unspecified: Secondary | ICD-10-CM

## 2018-11-15 DIAGNOSIS — D124 Benign neoplasm of descending colon: Secondary | ICD-10-CM

## 2018-11-15 DIAGNOSIS — K633 Ulcer of intestine: Secondary | ICD-10-CM | POA: Diagnosis not present

## 2018-11-15 MED ORDER — SODIUM CHLORIDE 0.9 % IV SOLN: 500.0000 mL | Freq: Once | INTRAVENOUS | Status: DC

## 2018-11-15 NOTE — Progress Notes (Signed)
Pt's states no medical or surgical changes since previsit or office visit. 

## 2018-11-15 NOTE — Op Note (Signed)
[place] Patient Name: Sheila [name] Procedure Date: 11/15/2018 2:45 PM MRN: [id number] Endoscopist: Jackquline Denmark , MD Age: 78 Referring MD:  Date of Birth: 01/29/1941 Gender: Female Account #: [id number] Procedure:                Colonoscopy Indications:              Screening in patient at increased risk: Colorectal                            cancer in brother 93 or older, Incidental -                            Abdominal pain in the left lower quadrant, with                            negative CT Abdo/pelvis Medicines:                Monitored Anesthesia Care Procedure:                Pre-Anesthesia Assessment:                           - Prior to the procedure, a History and Physical                            was performed, and patient medications and                            allergies were reviewed. The patient's tolerance of                            previous anesthesia was also reviewed. The risks                            and benefits of the procedure and the sedation                            options and risks were discussed with the patient.                            All questions were answered, and informed consent                            was obtained. Prior Anticoagulants: The patient has                            taken no previous anticoagulant or antiplatelet                            agents. ASA Grade Assessment: II - A patient with                            mild systemic disease. After reviewing the risks  and benefits, the patient was deemed in                            satisfactory condition to undergo the procedure.                           After obtaining informed consent, the colonoscope                            was passed under direct vision. Throughout the                            procedure, the patient's blood pressure, pulse, and                            oxygen saturations were monitored continuously.  The                            Colonoscope was introduced through the anus and                            advanced to the 1 cm into the ileum. The                            colonoscopy was performed without difficulty. The                            patient tolerated the procedure well. The quality                            of the bowel preparation was excellent. The                            ileocecal valve, appendiceal orifice, and rectum                            were photographed. Scope In: 3:00:41 PM Scope Out: 3:15:35 PM Scope Withdrawal Time: 0 hours 10 minutes 50 seconds  Total Procedure Duration: 0 hours 14 minutes 54 seconds  Findings:                 A 6 mm polyp was found in the descending colon. The                            polyp was sessile. The polyp was removed with a                            cold snare. Resection and retrieval were complete.                            Estimated blood loss: none.                           Multiple medium-mouthed diverticula were found in  the sigmoid colon and descending colon.                           Non-bleeding internal hemorrhoids were found during                            retroflexion. The hemorrhoids were small.                           The exam was otherwise without abnormality.                           The terminal ileum appeared normal. Complications:            No immediate complications. Estimated Blood Loss:     Estimated blood loss: none. Impression:               - One 6 mm polyp in the descending colon, removed                            with a cold snare. Resected and retrieved.                           - Moderate predominantly sigmoid diverticulosis. No                            diverticulitis.                           - Otherwise normal colonoscopy to TI. Recommendation:           - Patient has a contact number available for                            emergencies. The signs and  symptoms of potential                            delayed complications were discussed with the                            patient. Return to normal activities tomorrow.                            Written discharge instructions were provided to the                            patient.                           - Resume previous diet.                           - Continue present medications.                           - Use Benefiber two teaspoons PO daily.                           -  Return to GI clinic in 12 weeks.                           - Please give copy of the CT scan Abdo/pelvis Jackquline Denmark, MD 11/15/2018 3:21:31 PM This report has been signed electronically.

## 2018-11-15 NOTE — Op Note (Signed)
[place] Patient Name: Sheila [name] Procedure Date: 11/15/2018 2:45 PM MRN: [id number] Endoscopist: Jackquline Denmark , MD Age: 78 Referring MD:  Date of Birth: [birth year] Gender: Female Account #: [id number] Procedure:                Upper GI endoscopy Indications:              Dysphagia Medicines:                Monitored Anesthesia Care Procedure:                Pre-Anesthesia Assessment:                           - Prior to the procedure, a History and Physical                            was performed, and patient medications and                            allergies were reviewed. The patient's tolerance of                            previous anesthesia was also reviewed. The risks                            and benefits of the procedure and the sedation                            options and risks were discussed with the patient.                            All questions were answered, and informed consent                            was obtained. Prior Anticoagulants: The patient has                            taken no previous anticoagulant or antiplatelet                            agents. ASA Grade Assessment: II - A patient with                            mild systemic disease. After reviewing the risks                            and benefits, the patient was deemed in                            satisfactory condition to undergo the procedure.                           - Prior to the procedure, a History and Physical  was performed, and patient medications and                            allergies were reviewed. The patient's tolerance of                            previous anesthesia was also reviewed. The risks                            and benefits of the procedure and the sedation                            options and risks were discussed with the patient.                            All questions were answered, and informed consent                       was obtained. Prior Anticoagulants: The patient has                            taken no previous anticoagulant or antiplatelet                            agents. ASA Grade Assessment: II - A patient with                            mild systemic disease. After reviewing the risks                            and benefits, the patient was deemed in                            satisfactory condition to undergo the procedure.                           After obtaining informed consent, the endoscope was                            passed under direct vision. Throughout the                            procedure, the patient's blood pressure, pulse, and                            oxygen saturations were monitored continuously. The                            Endoscope was introduced through the mouth, and                            advanced to the second part of duodenum. The upper  GI endoscopy was accomplished without difficulty.                            The patient tolerated the procedure well. Scope In: Scope Out: Findings:                 The examined esophagus was normal. Schatzki's ring                            was noted at the gastroesophageal junction 36 cm                            from the incisors with luminal diameter of                            approximately 14 mm. Dilation was performed with a                            Maloney dilator with mild resistance at 50 Fr.                           A small hiatal hernia was noted. The entire                            examined stomach was normal.                           2-3 erosions without bleeding were found in the                            second portion of the duodenum. Biopsies were taken                            with a cold forceps for histology. Estimated blood                            loss: none. Complications:            No immediate complications. Estimated Blood Loss:      Estimated blood loss: none. Impression:               -Schatzki's ring s/p esophageal dilatation.                           -Small hiatal hernia.                           -Duodenal erosions without bleeding. Biopsied. Recommendation:           - Patient has a contact number available for                            emergencies. The signs and symptoms of potential                            delayed complications were discussed with the  patient. Return to normal activities tomorrow.                            Written discharge instructions were provided to the                            patient.                           - Post dilatation diet.                           - Continue present medications.                           - Avoid nonsteroidals if possible. Jackquline Denmark, MD 11/15/2018 3:27:05 PM This report has been signed electronically.

## 2018-11-15 NOTE — Progress Notes (Signed)
To PACU, VSS. Report to [email or web address] 

## 2018-11-15 NOTE — Patient Instructions (Addendum)
Impression/Recommendations:  Polyp handout given to patient. Diverticulosis handout given to patient. Dilation diet handout given to patient.   Resume previous diet. Continue present medications.  Avoid nonsteroidals.  Tylenol only if possible.  Use Benefiber 2 teaspoons by mouth daily. Return to GI clinic in 12 weeks.  YOU HAD AN ENDOSCOPIC PROCEDURE TODAY AT [facility]:   Refer to the procedure report that was given to you for any specific questions about what was found during the examination.  If the procedure report does not answer your questions, please call your gastroenterologist to clarify.  If you requested that your care partner not be given the details of your procedure findings, then the procedure report has been included in a sealed envelope for you to review at your convenience later.  YOU SHOULD EXPECT: Some feelings of bloating in the abdomen. Passage of more gas than usual.  Walking can help get rid of the air that was put into your GI tract during the procedure and reduce the bloating. If you had a lower endoscopy (such as a colonoscopy or flexible sigmoidoscopy) you may notice spotting of blood in your stool or on the toilet paper. If you underwent a bowel prep for your procedure, you may not have a normal bowel movement for a few days.  Please Note:  You might notice some irritation and congestion in your nose or some drainage.  This is from the oxygen used during your procedure.  There is no need for concern and it should clear up in a day or so.  SYMPTOMS TO REPORT IMMEDIATELY:   Following lower endoscopy (colonoscopy or flexible sigmoidoscopy):  Excessive amounts of blood in the stool  Significant tenderness or worsening of abdominal pains  Swelling of the abdomen that is new, acute  Fever of 100F or higher   Following upper endoscopy (EGD)  Vomiting of blood or coffee ground material  New chest pain or pain under the shoulder blades  Painful  or persistently difficult swallowing  New shortness of breath  Fever of 100F or higher  Black, tarry-looking stools  For urgent or emergent issues, a gastroenterologist can be reached at any hour by calling [phone].   DIET:  We do recommend a small meal at first, but then you may proceed to your regular diet.  Drink plenty of fluids but you should avoid alcoholic beverages for 24 hours.  ACTIVITY:  You should plan to take it easy for the rest of today and you should NOT DRIVE or use heavy machinery until tomorrow (because of the sedation medicines used during the test).    FOLLOW UP: Our staff will call the number listed on your records the next business day following your procedure to check on you and address any questions or concerns that you may have regarding the information given to you following your procedure. If we do not reach you, we will leave a message.  However, if you are feeling well and you are not experiencing any problems, there is no need to return our call.  We will assume that you have returned to your regular daily activities without incident.  If any biopsies were taken you will be contacted by phone or by letter within the next 1-3 weeks.  Please call us at [phone] if you have not heard about the biopsies in 3 weeks.    SIGNATURES/CONFIDENTIALITY: You and/or your care partner have signed paperwork which will be entered into your electronic medical record.  These signatures attest to the fact that that the information above on your After Visit Summary has been reviewed and is understood.  Full responsibility of the confidentiality of this discharge information lies with you and/or your care-partner.

## 2018-11-16 ENCOUNTER — Telehealth: Payer: Self-pay | Admitting: *Deleted

## 2018-11-16 NOTE — Telephone Encounter (Signed)
  Follow up Call-  Call back number 11/15/2018  Post procedure Call Back phone  # [phone]  Permission to leave phone message Yes  Some recent data might be hidden     Patient questions:  Do you have a fever, pain , or abdominal swelling? No. Pain Score  0 *  Have you tolerated food without any problems? Yes.    Have you been able to return to your normal activities? Yes.    Do you have any questions about your discharge instructions: Diet   No. Medications  No. Follow up visit  No.  Do you have questions or concerns about your Care? No.  Actions: * If pain score is 4 or above: No action needed, pain <4.

## 2018-11-22 ENCOUNTER — Telehealth: Payer: Self-pay | Admitting: Gastroenterology

## 2018-11-22 NOTE — Telephone Encounter (Signed)
Thanks for letting me know!

## 2018-11-22 NOTE — Telephone Encounter (Signed)
The pt states she has left shoulder pain, radiates to the elbow.  Shortness of breath. Some chest pain that she does not think is cardiac.   Had a fever on Thursday.  She says she did fine after her procedure on 3/9.  She was advised to go to the ED especially with the SOB, and left side shoulder pain.  She states she will call her PCP and ask to be evaluated.  FYI Dr Lyndel Safe

## 2018-11-22 NOTE — Telephone Encounter (Signed)
Pt had a endo/colon on 11-15-18 and says that she is having shortness of breathe and a pain on her left shoulder blade and had a fever on Thursday 11-18-18. wants to know if it can me related to her procedure or her Acid reflux.

## 2018-11-23 DIAGNOSIS — J029 Acute pharyngitis, unspecified: Secondary | ICD-10-CM | POA: Diagnosis not present

## 2018-11-23 DIAGNOSIS — Z6829 Body mass index (BMI) 29.0-29.9, adult: Secondary | ICD-10-CM | POA: Diagnosis not present

## 2018-11-25 ENCOUNTER — Encounter: Payer: Self-pay | Admitting: Gastroenterology

## 2018-11-30 DIAGNOSIS — R0602 Shortness of breath: Secondary | ICD-10-CM | POA: Diagnosis not present

## 2018-11-30 DIAGNOSIS — R079 Chest pain, unspecified: Secondary | ICD-10-CM | POA: Diagnosis not present

## 2018-11-30 DIAGNOSIS — Z6829 Body mass index (BMI) 29.0-29.9, adult: Secondary | ICD-10-CM | POA: Diagnosis not present

## 2018-12-04 DIAGNOSIS — R1013 Epigastric pain: Secondary | ICD-10-CM | POA: Diagnosis not present

## 2018-12-07 ENCOUNTER — Telehealth: Payer: Self-pay | Admitting: Gastroenterology

## 2018-12-07 DIAGNOSIS — K219 Gastro-esophageal reflux disease without esophagitis: Secondary | ICD-10-CM | POA: Diagnosis not present

## 2018-12-07 DIAGNOSIS — E118 Type 2 diabetes mellitus with unspecified complications: Secondary | ICD-10-CM | POA: Diagnosis not present

## 2018-12-07 NOTE — Telephone Encounter (Signed)
Pt called to inform that she is having a lot of GERD problem.  Please advise.

## 2018-12-07 NOTE — Telephone Encounter (Signed)
Pt taking Leviquin 500mg  she is on day 8 of 10 for URI. States she has been having pain that goes up her back, into her neck along with lots of burping. When she burps the pain is better. PCP states not cardiac, thinks antibiotic may be bothering her gerd. Pt taking protonix 40mg  bid, zantac 300mg  at bedtime, and carafate 1gm 3-4 times a day. Please advise.

## 2018-12-08 NOTE — Telephone Encounter (Signed)
She is pretty much maxed on the reflux medications Should get better once she is off antibiotics If still with problems by next week, let us know.  Then we can mail her samples of Dexilant. Due to her advanced age and comorbid conditions, should not come for OV.

## 2018-12-08 NOTE — Telephone Encounter (Signed)
Spoke with and she is aware. Reports she is better today and feels much better. Pt knows to let us know if she has any other problems.

## 2018-12-15 DIAGNOSIS — Z6829 Body mass index (BMI) 29.0-29.9, adult: Secondary | ICD-10-CM | POA: Diagnosis not present

## 2018-12-15 DIAGNOSIS — J069 Acute upper respiratory infection, unspecified: Secondary | ICD-10-CM | POA: Diagnosis not present

## 2018-12-15 DIAGNOSIS — R9389 Abnormal findings on diagnostic imaging of other specified body structures: Secondary | ICD-10-CM | POA: Diagnosis not present

## 2018-12-31 ENCOUNTER — Other Ambulatory Visit: Payer: Self-pay | Admitting: *Deleted

## 2018-12-31 NOTE — Patient Outreach (Signed)
[facility]) Care Management  12/31/2018  Sheila [name] 1941-01-31 [id number]   [place]  Referral Date: 4/11/[birth year] Referral Source: Colon Reason for Referral: Medication Assistance/Disease Management Education Insurance: [employer]   Outreach Attempt:  Successful telephone outreach to patient for confirmation of engaging with [facility].  HIPAA verified with patient.  Endorses she is now engaged with [facility].  RN Health Coach discussed discontinuation of services due to duplication of services and thanked patient for working with [facility].  Patient stated her understanding.  Plan:  RN Health Coach will close Disease Management Case and make patient inactive to [facility].  RN Health Coach will send primary care provider CM Case Closure Letter.  RN Health Coach will send patient CM Case Closure Letter.  [place] [phone] [email or web address]

## 2019-01-06 DIAGNOSIS — K219 Gastro-esophageal reflux disease without esophagitis: Secondary | ICD-10-CM | POA: Diagnosis not present

## 2019-01-06 DIAGNOSIS — E782 Mixed hyperlipidemia: Secondary | ICD-10-CM | POA: Diagnosis not present

## 2019-01-19 ENCOUNTER — Ambulatory Visit: Payer: PPO | Admitting: *Deleted

## 2019-01-28 DIAGNOSIS — R062 Wheezing: Secondary | ICD-10-CM | POA: Diagnosis not present

## 2019-01-28 DIAGNOSIS — Z6829 Body mass index (BMI) 29.0-29.9, adult: Secondary | ICD-10-CM | POA: Diagnosis not present

## 2019-02-05 DIAGNOSIS — E782 Mixed hyperlipidemia: Secondary | ICD-10-CM | POA: Diagnosis not present

## 2019-02-05 DIAGNOSIS — I10 Essential (primary) hypertension: Secondary | ICD-10-CM | POA: Diagnosis not present

## 2019-02-11 DIAGNOSIS — R918 Other nonspecific abnormal finding of lung field: Secondary | ICD-10-CM | POA: Diagnosis not present

## 2019-02-11 DIAGNOSIS — R9389 Abnormal findings on diagnostic imaging of other specified body structures: Secondary | ICD-10-CM | POA: Diagnosis not present

## 2019-03-08 DIAGNOSIS — E782 Mixed hyperlipidemia: Secondary | ICD-10-CM | POA: Diagnosis not present

## 2019-03-08 DIAGNOSIS — I10 Essential (primary) hypertension: Secondary | ICD-10-CM | POA: Diagnosis not present

## 2019-03-09 ENCOUNTER — Ambulatory Visit: Payer: PPO | Admitting: Gastroenterology

## 2019-03-15 DIAGNOSIS — S161XXA Strain of muscle, fascia and tendon at neck level, initial encounter: Secondary | ICD-10-CM | POA: Diagnosis not present

## 2019-03-15 DIAGNOSIS — Z6829 Body mass index (BMI) 29.0-29.9, adult: Secondary | ICD-10-CM | POA: Diagnosis not present

## 2019-03-15 DIAGNOSIS — R9389 Abnormal findings on diagnostic imaging of other specified body structures: Secondary | ICD-10-CM | POA: Diagnosis not present

## 2019-03-18 ENCOUNTER — Encounter: Payer: Self-pay | Admitting: Gastroenterology

## 2019-03-18 ENCOUNTER — Telehealth (INDEPENDENT_AMBULATORY_CARE_PROVIDER_SITE_OTHER): Payer: PPO | Admitting: Gastroenterology

## 2019-03-18 ENCOUNTER — Other Ambulatory Visit: Payer: Self-pay

## 2019-03-18 VITALS — Ht 63.0 in | Wt 164.0 lb

## 2019-03-18 DIAGNOSIS — K5900 Constipation, unspecified: Secondary | ICD-10-CM

## 2019-03-18 DIAGNOSIS — R1032 Left lower quadrant pain: Secondary | ICD-10-CM

## 2019-03-18 DIAGNOSIS — R1310 Dysphagia, unspecified: Secondary | ICD-10-CM

## 2019-03-18 DIAGNOSIS — R1319 Other dysphagia: Secondary | ICD-10-CM

## 2019-03-18 DIAGNOSIS — K219 Gastro-esophageal reflux disease without esophagitis: Secondary | ICD-10-CM

## 2019-03-18 DIAGNOSIS — Z800 Family history of malignant neoplasm of digestive organs: Secondary | ICD-10-CM

## 2019-03-18 NOTE — Progress Notes (Signed)
Chief Complaint: FU  Referring Provider:  Mateo Flow, MD      ASSESSMENT AND PLAN;   #1. GERD with eso dysphagia d/t Schatzki's ring s/p dil 50Fr 11/2018 with complete resolution. #2. LLQ pain with neg CT at [facility]  11/2018 (report scanned).  Likely musculoskeletal. #3. Constipation -likely exacerbated by medications like amlodipine (better). #4. FH colon cancer (brother with stage IV colon cancer at age 77). Neg colon 11/2018 except for moderate sigmoid diverticulosis.  Plan: - Can get COVID-19 tested through [facility].  I have discussed with grandson as to how to get the test done. - Miralax 17 g p.o. once a day if with any constipation. - She will get in touch with Dr Geraldo Pitter regarding chest pains. - Continue Protonix 40 mg p.o. QD for now. - Avoid Nonsteroidals.  HPI:    Sheila [name] is a 78 y.o. female  Doing good from the GI standpoint except for occasional abdominal discomfort.   Seen in the ED at [facility] with chest pains-found to have mild pneumonia, treated with Levaquin.  Subsequent CT chest on 02/11/2019 showed complete resolution of previous pulmonary abnormalities but showed new scattered groundglass opacities measuring 7 mm in the right lung.  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  Also had nonspecific chest pains for which she does have appointment with Dr. Geraldo Pitter.  Very nonspecific symptoms.  Wants to get COVID-19 tested.   Past GI procedures: -Colonoscopy 11/2018 ([facility])-moderate sigmoid diverticulosis.  6 mm polyp status post polypectomy. Bx- neg -EGD 11/2018 Schatzki's ring status post esophageal dilatation 50 Fr, small hiatal hernia, few duodenal erosions.  Negative biopsies for HP. -CT Abdo/pelvis with contrast: 11/11/2018-negative except for DJD thoracolumbar spine, calcified uterine fibroids, sigmoid diverticulosis without diverticulitis. - CT chest 02/11/2019: 3.6 cm ascending thoracic  aortic aneurysm, 3.7 cm descending thoracic aortic aneurysm, coronary calcification.  Being followed by Dr. Chancy Milroy and Dr. Geraldo Pitter. Past Medical History:  Diagnosis Date  . Allergy   . Anxiety   . Arthritis   . Asthma   . Blood transfusion without reported diagnosis   . Cataract   . Chronic headaches   . Depression   . GERD (gastroesophageal reflux disease)   . Glaucoma   . Heart murmur   . Hyperlipidemia   . Hypertension     Past Surgical History:  Procedure Laterality Date  . CESAREAN SECTION    . COLONOSCOPY  05/16/2015   Moderate sigmoid diverticulosis.   Marland Kitchen EYE SURGERY    . LEFT HEART CATH AND CORONARY ANGIOGRAPHY N/A 12/28/2017   Procedure: LEFT HEART CATH AND CORONARY ANGIOGRAPHY;  Surgeon: Belva Crome, MD;  Location: [place] [facility];  Service: Cardiovascular;  Laterality: N/A;  . MASTOIDECTOMY    . PATENT DUCTUS ARTERIOUS REPAIR    . SHOULDER OPEN ROTATOR CUFF REPAIR Bilateral     Family History  Problem Relation Age of Onset  . Heart attack Mother   . Heart attack Sister   . Colon cancer Brother   . Diabetes Brother   . Diabetes Daughter   . Stomach cancer Sister   . Esophageal cancer Neg Hx     Social History   Tobacco Use  . Smoking status: Never Smoker  . Smokeless tobacco: Never Used  Substance Use Topics  . Alcohol use: No  . Drug use: Never    Current Outpatient Medications  Medication Sig Dispense Refill  . acetaminophen (TYLENOL) 500  MG tablet Take 500 mg by mouth every 6 (six) hours as needed.    . ALPRAZolam (XANAX) 0.25 MG tablet Take 0.25 mg by mouth 2 (two) times daily as needed for anxiety.    Marland Kitchen aspirin EC 81 MG tablet Take 81 mg by mouth daily.    . carvedilol (COREG) 6.25 MG tablet Take 6.25 mg by mouth 2 (two) times daily with a meal.    . Coenzyme Q10 (CO Q-10) 200 MG CAPS Take 1 capsule by mouth daily.     . dorzolamide (TRUSOPT) 2 % ophthalmic solution Place 1 drop into both eyes daily.     . fluticasone (FLONASE) 50 MCG/ACT  nasal spray Place 2 sprays into both nostrils 2 (two) times daily as needed.    . furosemide (LASIX) 40 MG tablet Take 40 mg by mouth daily as needed.  6  . meclizine (ANTIVERT) 12.5 MG tablet Take 12.5 mg by mouth 4 (four) times daily as needed for dizziness.    . pantoprazole (PROTONIX) 40 MG tablet Take 40 mg by mouth 2 (two) times daily.    . rosuvastatin (CRESTOR) 10 MG tablet Take 10 mg by mouth every Monday, Wednesday, and Friday.    . sucralfate (CARAFATE) 1 g tablet Take 1 g by mouth 4 (four) times daily -  with meals and at bedtime.     . timolol (BETIMOL) 0.5 % ophthalmic solution Place 1 drop into both eyes 2 (two) times daily.     Marland Kitchen tiZANidine (ZANAFLEX) 2 MG tablet Take 2 mg by mouth at bedtime.    Marland Kitchen amLODipine (NORVASC) 5 MG tablet Take 1 tablet (5 mg total) by mouth daily. 90 tablet 0  . brimonidine (ALPHAGAN) 0.15 % ophthalmic solution Place 1 drop into both eyes every 12 (twelve) hours.     . nitroGLYCERIN (NITROSTAT) 0.4 MG SL tablet Place 1 tablet (0.4 mg total) under the tongue every 5 (five) minutes as needed for chest pain. (Patient not taking: Reported on 11/01/2018) 25 tablet 11   Current Facility-Administered Medications  Medication Dose Route Frequency Provider Last Rate Last Dose  . 0.9 %  sodium chloride infusion  500 mL Intravenous Once Jackquline Denmark, MD        Allergies  Allergen Reactions  . Antihistamines, Chlorpheniramine-Type Anaphylaxis  . Ativan [Lorazepam] Swelling  . Ceftin [Cefuroxime Axetil]   . Meloxicam   . Pheneen [Benzalkonium Chloride]   . Morphine Palpitations    Review of Systems:  Neg except for HPI and Has sinus allergies, arthritis, back pain, vision changes, anxiety/depression, fatigue, headaches, hearing problems, excessive urination, sleeping problems.  Had history of nosebleeds in the past which have resolved.     Physical Exam:    Ht 5\' 3"  (1.6 m)   Wt 164 lb (74.4 kg)   BMI 29.05 kg/m  Filed Weights   03/18/19 1208   Weight: 164 lb (74.4 kg)   televisit  Data Reviewed: I have personally reviewed following labs and imaging studies  CBC: CBC Latest Ref Rng & Units 12/28/2017  Hemoglobin 12.0 - 15.0 g/dL 13.3  Hematocrit 36.0 - 46.0 % 39.0    CMP: CMP Latest Ref Rng & Units 11/09/2018 02/04/2018 12/28/2017  Glucose 70 - 99 mg/dL 130(H) 104(H) 103(H)  BUN 6 - 23 mg/dL 15 16 15   Creatinine 0.40 - 1.20 mg/dL 0.69 0.67 0.70  Sodium 135 - 145 mEq/L 140 141 141  Potassium 3.5 - 5.1 mEq/L 4.0 4.2 3.5  Chloride 96 - 112 mEq/L  102 103 101  CO2 19 - 32 mEq/L 30 24 -  Calcium 8.4 - 10.5 mg/dL 9.3 9.6 -  Total Protein 6.0 - 8.3 g/dL 6.6 - -  Total Bilirubin 0.2 - 1.2 mg/dL 0.4 - -  Alkaline Phos 39 - 117 U/L 84 - -  AST 0 - 37 U/L 11 - -  ALT 0 - 35 U/L 10 - -   This service was provided via telemedicine. Video-doxy failed.  The patient was located at home.  The provider was located in office.  The patient did consent to this telephone visit and is aware of possible charges through their insurance for this visit. The other persons participating in this telemedicine service were grandson and their role was coordination of care.  Time spent on call: 15 min  Carmell Austria, MD 03/18/2019, 1:28 PM  Cc: Mateo Flow, MD

## 2019-03-18 NOTE — Patient Instructions (Signed)
If you are age 77 or older, your body mass index should be between 23-30. Your Body mass index is 29.05 kg/m. If this is out of the aforementioned range listed, please consider follow up with your Primary Care Provider.  If you are age 75 or younger, your body mass index should be between 19-25. Your Body mass index is 29.05 kg/m. If this is out of the aformentioned range listed, please consider follow up with your Primary Care Provider.   To help prevent the possible spread of infection to our patients, communities, and staff; we will be implementing the following measures:  As of now we are not allowing any visitors/family members to accompany you to any upcoming appointments with [facility]. If you have any concerns about this please contact our office to discuss prior to the appointment.   Go to [facility] to get tested for Cov[facility].  Take Miralax 17g once daily as needed for constipation.  Continue taking medications are prescribed.  Thank you,  Dr. Jackquline Denmark

## 2019-03-20 DIAGNOSIS — Z20828 Contact with and (suspected) exposure to other viral communicable diseases: Secondary | ICD-10-CM | POA: Diagnosis not present

## 2019-03-23 DIAGNOSIS — Z6829 Body mass index (BMI) 29.0-29.9, adult: Secondary | ICD-10-CM | POA: Diagnosis not present

## 2019-03-23 DIAGNOSIS — R079 Chest pain, unspecified: Secondary | ICD-10-CM | POA: Diagnosis not present

## 2019-03-23 DIAGNOSIS — M25471 Effusion, right ankle: Secondary | ICD-10-CM | POA: Diagnosis not present

## 2019-04-08 DIAGNOSIS — E782 Mixed hyperlipidemia: Secondary | ICD-10-CM | POA: Diagnosis not present

## 2019-04-08 DIAGNOSIS — I10 Essential (primary) hypertension: Secondary | ICD-10-CM | POA: Diagnosis not present

## 2019-04-08 DIAGNOSIS — K219 Gastro-esophageal reflux disease without esophagitis: Secondary | ICD-10-CM | POA: Diagnosis not present

## 2019-04-14 DIAGNOSIS — E669 Obesity, unspecified: Secondary | ICD-10-CM | POA: Diagnosis not present

## 2019-04-14 DIAGNOSIS — R42 Dizziness and giddiness: Secondary | ICD-10-CM | POA: Diagnosis not present

## 2019-04-14 DIAGNOSIS — K5900 Constipation, unspecified: Secondary | ICD-10-CM | POA: Diagnosis not present

## 2019-04-14 DIAGNOSIS — R0789 Other chest pain: Secondary | ICD-10-CM | POA: Diagnosis not present

## 2019-04-14 DIAGNOSIS — Z6829 Body mass index (BMI) 29.0-29.9, adult: Secondary | ICD-10-CM | POA: Diagnosis not present

## 2019-05-06 DIAGNOSIS — H402212 Chronic angle-closure glaucoma, right eye, moderate stage: Secondary | ICD-10-CM | POA: Diagnosis not present

## 2019-05-09 DIAGNOSIS — E782 Mixed hyperlipidemia: Secondary | ICD-10-CM | POA: Diagnosis not present

## 2019-05-09 DIAGNOSIS — K219 Gastro-esophageal reflux disease without esophagitis: Secondary | ICD-10-CM | POA: Diagnosis not present

## 2019-05-20 DIAGNOSIS — H18231 Secondary corneal edema, right eye: Secondary | ICD-10-CM | POA: Diagnosis not present

## 2019-05-20 DIAGNOSIS — H401112 Primary open-angle glaucoma, right eye, moderate stage: Secondary | ICD-10-CM | POA: Diagnosis not present

## 2019-05-20 DIAGNOSIS — Z01818 Encounter for other preprocedural examination: Secondary | ICD-10-CM | POA: Diagnosis not present

## 2019-05-20 DIAGNOSIS — H402212 Chronic angle-closure glaucoma, right eye, moderate stage: Secondary | ICD-10-CM | POA: Diagnosis not present

## 2019-05-20 DIAGNOSIS — H409 Unspecified glaucoma: Secondary | ICD-10-CM | POA: Diagnosis not present

## 2019-06-02 DIAGNOSIS — Z0000 Encounter for general adult medical examination without abnormal findings: Secondary | ICD-10-CM | POA: Diagnosis not present

## 2019-06-02 DIAGNOSIS — R110 Nausea: Secondary | ICD-10-CM | POA: Diagnosis not present

## 2019-06-02 DIAGNOSIS — Z1331 Encounter for screening for depression: Secondary | ICD-10-CM | POA: Diagnosis not present

## 2019-06-02 DIAGNOSIS — R9389 Abnormal findings on diagnostic imaging of other specified body structures: Secondary | ICD-10-CM | POA: Diagnosis not present

## 2019-06-02 DIAGNOSIS — Z23 Encounter for immunization: Secondary | ICD-10-CM | POA: Diagnosis not present

## 2019-06-02 DIAGNOSIS — Z79899 Other long term (current) drug therapy: Secondary | ICD-10-CM | POA: Diagnosis not present

## 2019-06-02 DIAGNOSIS — Z6829 Body mass index (BMI) 29.0-29.9, adult: Secondary | ICD-10-CM | POA: Diagnosis not present

## 2019-06-02 DIAGNOSIS — E118 Type 2 diabetes mellitus with unspecified complications: Secondary | ICD-10-CM | POA: Diagnosis not present

## 2019-06-02 DIAGNOSIS — E782 Mixed hyperlipidemia: Secondary | ICD-10-CM | POA: Diagnosis not present

## 2019-06-08 DIAGNOSIS — E782 Mixed hyperlipidemia: Secondary | ICD-10-CM | POA: Diagnosis not present

## 2019-06-08 DIAGNOSIS — E118 Type 2 diabetes mellitus with unspecified complications: Secondary | ICD-10-CM | POA: Diagnosis not present

## 2019-06-08 DIAGNOSIS — R9389 Abnormal findings on diagnostic imaging of other specified body structures: Secondary | ICD-10-CM | POA: Diagnosis not present

## 2019-06-08 DIAGNOSIS — R079 Chest pain, unspecified: Secondary | ICD-10-CM | POA: Diagnosis not present

## 2019-06-13 DIAGNOSIS — R0789 Other chest pain: Secondary | ICD-10-CM | POA: Diagnosis not present

## 2019-06-13 DIAGNOSIS — I672 Cerebral atherosclerosis: Secondary | ICD-10-CM | POA: Diagnosis not present

## 2019-06-13 DIAGNOSIS — R519 Headache, unspecified: Secondary | ICD-10-CM | POA: Diagnosis not present

## 2019-07-08 DIAGNOSIS — E782 Mixed hyperlipidemia: Secondary | ICD-10-CM | POA: Diagnosis not present

## 2019-07-08 DIAGNOSIS — E118 Type 2 diabetes mellitus with unspecified complications: Secondary | ICD-10-CM | POA: Diagnosis not present

## 2019-07-18 DIAGNOSIS — K5900 Constipation, unspecified: Secondary | ICD-10-CM | POA: Diagnosis not present

## 2019-07-18 DIAGNOSIS — E669 Obesity, unspecified: Secondary | ICD-10-CM | POA: Diagnosis not present

## 2019-07-18 DIAGNOSIS — Z6829 Body mass index (BMI) 29.0-29.9, adult: Secondary | ICD-10-CM | POA: Diagnosis not present

## 2019-07-18 DIAGNOSIS — R0789 Other chest pain: Secondary | ICD-10-CM | POA: Diagnosis not present

## 2019-07-18 DIAGNOSIS — I10 Essential (primary) hypertension: Secondary | ICD-10-CM | POA: Diagnosis not present

## 2019-07-18 DIAGNOSIS — L304 Erythema intertrigo: Secondary | ICD-10-CM | POA: Diagnosis not present

## 2019-07-21 DIAGNOSIS — S161XXA Strain of muscle, fascia and tendon at neck level, initial encounter: Secondary | ICD-10-CM | POA: Diagnosis not present

## 2019-07-21 DIAGNOSIS — R079 Chest pain, unspecified: Secondary | ICD-10-CM | POA: Diagnosis not present

## 2019-07-21 DIAGNOSIS — R0789 Other chest pain: Secondary | ICD-10-CM | POA: Diagnosis not present

## 2019-07-21 DIAGNOSIS — S46811A Strain of other muscles, fascia and tendons at shoulder and upper arm level, right arm, initial encounter: Secondary | ICD-10-CM | POA: Diagnosis not present

## 2019-07-21 IMAGING — XA DG FLUORO GUIDE LUMBAR PUNCTURE
1 series · 1 of 1 positions shown · non-contrast
Comparison: none

CLINICAL DATA: Chronic daily headaches.

[Series 1: ortho standard · 1 of 1 slices shown]
[im 1/1]
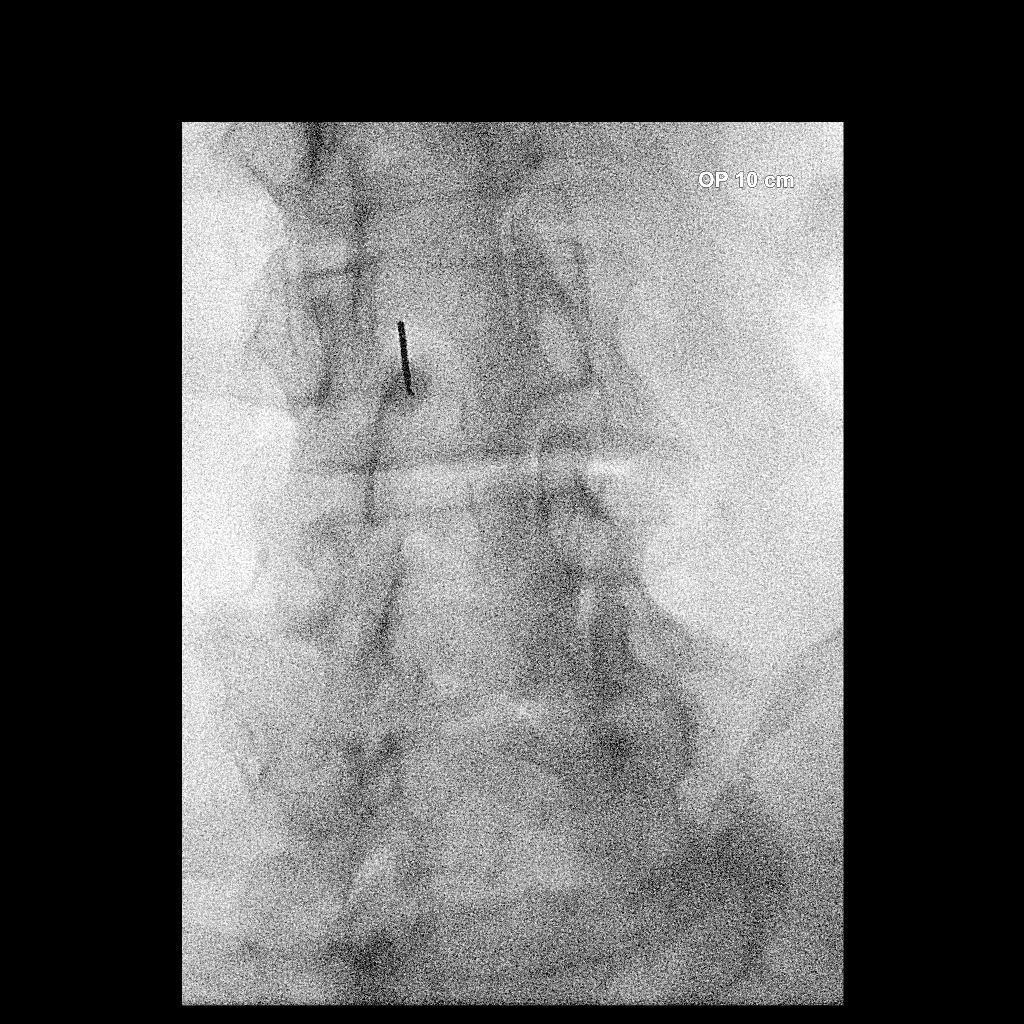

[1 of 1 positions shown; findings below may reference images not displayed]

EXAM:
DIAGNOSTIC LUMBAR PUNCTURE UNDER FLUOROSCOPIC GUIDANCE

FLUOROSCOPY TIME:  Fluoroscopy Time:  3 seconds

Radiation Exposure Index (if provided by the fluoroscopic device):
1.2 mGy

Number of Acquired Spot Images: 0

PROCEDURE:
Informed consent was obtained from the patient prior to the
procedure, including potential complications of headache, allergy,
and pain. With the patient prone, the lower back was prepped with
Betadine. 1% Lidocaine was used for local anesthesia. Lumbar
puncture was performed at the L2-L3 level via a right paramedian
approach using a 3.5 inch 20 gauge needle with return of clear, pink
CSF with an opening pressure of 10 cm water. 5 ml of CSF were
obtained for laboratory studies. The patient tolerated the procedure
well and there were no apparent complications.
IMPRESSION: 1. Technically successful fluoroscopically guided lumbar puncture.

## 2019-07-26 ENCOUNTER — Ambulatory Visit (INDEPENDENT_AMBULATORY_CARE_PROVIDER_SITE_OTHER): Payer: PPO | Admitting: Cardiology

## 2019-07-26 ENCOUNTER — Encounter: Payer: Self-pay | Admitting: Cardiology

## 2019-07-26 ENCOUNTER — Other Ambulatory Visit: Payer: Self-pay

## 2019-07-26 VITALS — BP 137/62 | HR 76 | Wt 160.2 lb

## 2019-07-26 DIAGNOSIS — I10 Essential (primary) hypertension: Secondary | ICD-10-CM | POA: Diagnosis not present

## 2019-07-26 DIAGNOSIS — I25119 Atherosclerotic heart disease of native coronary artery with unspecified angina pectoris: Secondary | ICD-10-CM | POA: Diagnosis not present

## 2019-07-26 DIAGNOSIS — E782 Mixed hyperlipidemia: Secondary | ICD-10-CM | POA: Diagnosis not present

## 2019-07-26 MED ORDER — ISOSORBIDE MONONITRATE ER 30 MG PO TB24: 30.0000 mg | ORAL_TABLET | Freq: Every day | ORAL | 1 refills | Status: DC

## 2019-07-26 NOTE — Progress Notes (Signed)
Cardiology Office Note:    Date:  07/26/2019   ID:  Sheila [name], DOB 15-Apr-1941, MRN [id number]  PCP:  Mateo Flow, MD  Cardiologist:  No primary care provider on file.  Electrophysiologist:  None   Referring MD: Mateo Flow, MD   Follow up visit.  History of Present Illness:    Sheila [name] is a 78 y.o. female with a hx of nonobstructive coronary artery disease by coronary angiography, essential hypertension and dyslipidemia. The patient usually follow Dr. Higinio Roger and was seen by him last on 07/15/2018 During her last visit her antihypertensives were being optimized. She was titrated off clonidine and started on Amlodipine 5 mg. She states that her blood pressure at home have been between [id number] systolic.   Today she reports that she has been experiencing intermittent diffuse chest tightness. She notes that she has some stress as her grandson and his girlfriend were both living with her. She notes that now this pain is still persistent. She did go to the [facility] recently to be evaluated for chest pain. She was work up and discharge to home with medication for musculoskeletal pain.   She denies shortness of breath, nausea or lightheadedness.   Past Medical History:  Diagnosis Date  . Allergy   . Anxiety   . Arthritis   . Asthma   . Blood transfusion without reported diagnosis   . Cataract   . Chronic headaches   . Depression   . GERD (gastroesophageal reflux disease)   . Glaucoma   . Heart murmur   . Hyperlipidemia   . Hypertension     Past Surgical History:  Procedure Laterality Date  . CESAREAN SECTION    . COLONOSCOPY  05/16/2015   Moderate sigmoid diverticulosis.   Marland Kitchen EYE SURGERY    . LEFT HEART CATH AND CORONARY ANGIOGRAPHY N/A 12/28/2017   Procedure: LEFT HEART CATH AND CORONARY ANGIOGRAPHY;  Surgeon: Belva Crome, MD;  Location: [facility];  Service: Cardiovascular;  Laterality: N/A;  . MASTOIDECTOMY    . PATENT DUCTUS  ARTERIOUS REPAIR    . SHOULDER OPEN ROTATOR CUFF REPAIR Bilateral     Current Medications: No outpatient medications have been marked as taking for the 07/26/19 encounter (Office Visit) with Berniece Salines, DO.   Current Facility-Administered Medications for the 07/26/19 encounter (Office Visit) with Berniece Salines, DO  Medication  . 0.9 %  sodium chloride infusion     Allergies:   Antihistamines, chlorpheniramine-type; Ativan [lorazepam]; Ceftin [cefuroxime axetil]; Meloxicam; Pheneen [benzalkonium chloride]; and Morphine   Social History   Socioeconomic History  . Marital status: Divorced    Spouse name: Not on file  . Number of children: 5  . Years of education: Not on file  . Highest education level: Not on file  Occupational History  . Occupation: Retired   Scientific laboratory technician  . Financial resource strain: Not on file  . Food insecurity    Worry: Not on file    Inability: Not on file  . Transportation needs    Medical: Not on file    Non-medical: Not on file  Tobacco Use  . Smoking status: Never Smoker  . Smokeless tobacco: Never Used  Substance and Sexual Activity  . Alcohol use: No  . Drug use: Never  . Sexual activity: Not on file  Lifestyle  . Physical activity    Days per week: Not on file    Minutes per session: Not on file  .  Stress: Not on file  Relationships  . Social Herbalist on phone: Not on file    Gets together: Not on file    Attends religious service: Not on file    Active member of club or organization: Not on file    Attends meetings of clubs or organizations: Not on file    Relationship status: Not on file  Other Topics Concern  . Not on file  Social History Narrative  . Not on file     Family History: The patient's family history includes Colon cancer in her brother; Diabetes in her brother and daughter; Heart attack in her mother and sister; Stomach cancer in her sister. There is no history of Esophageal cancer.  ROS:   Review of  Systems  Constitution: Negative for decreased appetite, fever and weight gain.  HENT: Negative for congestion, ear discharge, hoarse voice and sore throat.   Eyes: Negative for discharge, redness, vision loss in right eye and visual halos.  Cardiovascular:Reports chest pain. Negative for  dyspnea on exertion, leg swelling, orthopnea and palpitations.  Respiratory: Negative for cough, hemoptysis, shortness of breath and snoring.   Endocrine: Negative for heat intolerance and polyphagia.  Hematologic/Lymphatic: Negative for bleeding problem. Does not bruise/bleed easily.  Skin: Negative for flushing, nail changes, rash and suspicious lesions.  Musculoskeletal: Negative for arthritis, joint pain, muscle cramps, myalgias, neck pain and stiffness.  Gastrointestinal: Negative for abdominal pain, bowel incontinence, diarrhea and excessive appetite.  Genitourinary: Negative for decreased libido, genital sores and incomplete emptying.  Neurological: Negative for brief paralysis, focal weakness, headaches and loss of balance.  Psychiatric/Behavioral: Negative for altered mental status, depression and suicidal ideas.  Allergic/Immunologic: Negative for HIV exposure and persistent infections.    EKGs/Labs/Other Studies Reviewed:    The following studies were reviewed today:   EKG:  None today.  Recent Labs: 11/09/2018: ALT 10; BUN 15; Creatinine, Ser 0.69; Potassium 4.0; Sodium 140  Recent Lipid Panel No results found for: CHOL, TRIG, HDL, CHOLHDL, VLDL, LDLCALC, LDLDIRECT  Physical Exam:    VS:  BP 137/62   Wt 160 lb 3.4 oz (72.7 kg)   BMI 28.38 kg/m     Wt Readings from Last 3 Encounters:  07/26/19 160 lb 3.4 oz (72.7 kg)  03/18/19 164 lb (74.4 kg)  11/01/18 164 lb 2 oz (74.4 kg)     GEN: Well nourished, well developed in no acute distress HEENT: Normal NECK: No JVD; No carotid bruits LYMPHATICS: No lymphadenopathy CARDIAC: S1S2 noted,RRR, no murmurs, rubs, gallops RESPIRATORY:   Clear to auscultation without rales, wheezing or rhonchi  ABDOMEN: Soft, non-tender, non-distended, +bowel sounds, no guarding. EXTREMITIES: No edema, No cyanosis, no clubbing MUSCULOSKELETAL:  No edema; No deformity  SKIN: Warm and dry NEUROLOGIC:  Alert and oriented x 3, non-focal PSYCHIATRIC:  Normal affect, good insight  ASSESSMENT:    1. Coronary artery disease involving native coronary artery of native heart with angina pectoris ([place])   2. Essential hypertension   3. Mixed hyperlipidemia    PLAN:    She has been experiencing intermittent chest pain which sounds like stable angina therefore going to start patient on Imdur 30 mg daily.  We will reassess her in a month if this does not help improve her chest pain pain further recommendation will be made at that time.  She will continue on her current antihypertensive medication.  The patient is in agreement with the above plan. The patient left the office in stable condition.  The patient will follow up in 1 month with Dr. Geraldo Pitter.   Medication Adjustments/Labs and Tests Ordered: Current medicines are reviewed at length with the patient today.  Concerns regarding medicines are outlined above.  No orders of the defined types were placed in this encounter.  Meds ordered this encounter  Medications  . isosorbide mononitrate (IMDUR) 30 MG 24 hr tablet    Sig: Take 1 tablet (30 mg total) by mouth daily.    Dispense:  90 tablet    Refill:  1    Patient Instructions  Medication Instructions:  Your physician has recommended you make the following change in your medication:   START: Imdur(isosorbide) 30mg  Take 1 tab daily  *If you need a refill on your cardiac medications before your next appointment, please call your pharmacy*  Lab Work: None If you have labs (blood work) drawn today and your tests are completely normal, you will receive your results only by: Marland Kitchen MyChart Message (if you have MyChart) OR . A paper copy in the  mail If you have any lab test that is abnormal or we need to change your treatment, we will call you to review the results.  Testing/Procedures: None  Follow-Up: At [facility], you and your health needs are our priority.  As part of our continuing mission to provide you with exceptional heart care, we have created designated Provider Care Teams.  These Care Teams include your primary Cardiologist (physician) and Advanced Practice Providers (APPs -  Physician Assistants and Nurse Practitioners) who all work together to provide you with the care you need, when you need it.  Your next appointment:   1 month(s)  The format for your next appointment:   In Person  Provider:   Jyl Heinz, MD  Other Instructions      Adopting a Healthy Lifestyle.  Know what a healthy weight is for you (roughly BMI <25) and aim to maintain this   Aim for 7+ servings of fruits and vegetables daily   65-80+ fluid ounces of water or unsweet tea for healthy kidneys   Limit to max 1 drink of alcohol per day; avoid smoking/tobacco   Limit animal fats in diet for cholesterol and heart health - choose grass fed whenever available   Avoid highly processed foods, and foods high in saturated/trans fats   Aim for low stress - take time to unwind and care for your mental health   Aim for 150 min of moderate intensity exercise weekly for heart health, and weights twice weekly for bone health   Aim for 7-9 hours of sleep daily   When it comes to diets, agreement about the perfect plan isnt easy to find, even among the experts. Experts at the [employer] developed an idea known as the Healthy Eating Plate. Just imagine a plate divided into logical, healthy portions.   The emphasis is on diet quality:   Load up on vegetables and fruits - one-half of your plate: Aim for color and variety, and remember that potatoes dont count.   Go for whole grains - one-quarter of your plate:  Whole wheat, barley, wheat berries, quinoa, oats, brown rice, and foods made with them. If you want pasta, go with whole wheat pasta.   Protein power - one-quarter of your plate: Fish, chicken, beans, and nuts are all healthy, versatile protein sources. Limit red meat.   The diet, however, does go beyond the plate, offering a few other suggestions.   Use  healthy plant oils, such as olive, canola, soy, corn, sunflower and peanut. Check the labels, and avoid partially hydrogenated oil, which have unhealthy trans fats.   If youre thirsty, drink water. Coffee and tea are good in moderation, but skip sugary drinks and limit milk and dairy products to one or two daily servings.   The type of carbohydrate in the diet is more important than the amount. Some sources of carbohydrates, such as vegetables, fruits, whole grains, and beans-are healthier than others.   Finally, stay active  Signed, Berniece Salines, DO  07/26/2019 9:53 PM    [facility]

## 2019-07-26 NOTE — Patient Instructions (Signed)
Medication Instructions:  Your physician has recommended you make the following change in your medication:   START: Imdur(isosorbide) 30mg  Take 1 tab daily  *If you need a refill on your cardiac medications before your next appointment, please call your pharmacy*  Lab Work: None If you have labs (blood work) drawn today and your tests are completely normal, you will receive your results only by: Marland Kitchen MyChart Message (if you have MyChart) OR . A paper copy in the mail If you have any lab test that is abnormal or we need to change your treatment, we will call you to review the results.  Testing/Procedures: None  Follow-Up: At [facility], you and your health needs are our priority.  As part of our continuing mission to provide you with exceptional heart care, we have created designated Provider Care Teams.  These Care Teams include your primary Cardiologist (physician) and Advanced Practice Providers (APPs -  Physician Assistants and Nurse Practitioners) who all work together to provide you with the care you need, when you need it.  Your next appointment:   1 month(s)  The format for your next appointment:   In Person  Provider:   Jyl Heinz, MD  Other Instructions

## 2019-08-02 ENCOUNTER — Ambulatory Visit: Payer: PPO | Admitting: Cardiology

## 2019-08-08 DIAGNOSIS — E782 Mixed hyperlipidemia: Secondary | ICD-10-CM | POA: Diagnosis not present

## 2019-08-08 DIAGNOSIS — I10 Essential (primary) hypertension: Secondary | ICD-10-CM | POA: Diagnosis not present

## 2019-08-08 DIAGNOSIS — E118 Type 2 diabetes mellitus with unspecified complications: Secondary | ICD-10-CM | POA: Diagnosis not present

## 2019-08-17 DIAGNOSIS — L304 Erythema intertrigo: Secondary | ICD-10-CM | POA: Diagnosis not present

## 2019-08-17 DIAGNOSIS — Z6829 Body mass index (BMI) 29.0-29.9, adult: Secondary | ICD-10-CM | POA: Diagnosis not present

## 2019-08-17 DIAGNOSIS — I10 Essential (primary) hypertension: Secondary | ICD-10-CM | POA: Diagnosis not present

## 2019-08-17 DIAGNOSIS — R42 Dizziness and giddiness: Secondary | ICD-10-CM | POA: Diagnosis not present

## 2019-08-25 ENCOUNTER — Encounter: Payer: Self-pay | Admitting: Cardiology

## 2019-08-25 ENCOUNTER — Other Ambulatory Visit: Payer: Self-pay

## 2019-08-25 ENCOUNTER — Ambulatory Visit (INDEPENDENT_AMBULATORY_CARE_PROVIDER_SITE_OTHER): Payer: PPO | Admitting: Cardiology

## 2019-08-25 VITALS — BP 156/68 | HR 78 | Ht 63.5 in | Wt 164.6 lb

## 2019-08-25 DIAGNOSIS — I10 Essential (primary) hypertension: Secondary | ICD-10-CM

## 2019-08-25 DIAGNOSIS — E785 Hyperlipidemia, unspecified: Secondary | ICD-10-CM

## 2019-08-25 DIAGNOSIS — I2510 Atherosclerotic heart disease of native coronary artery without angina pectoris: Secondary | ICD-10-CM | POA: Diagnosis not present

## 2019-08-25 DIAGNOSIS — I208 Other forms of angina pectoris: Secondary | ICD-10-CM | POA: Diagnosis not present

## 2019-08-25 NOTE — Progress Notes (Signed)
Cardiology Office Note:    Date:  08/25/2019   ID:  Sheila [name], DOB 03-28-1941, MRN [id number]  PCP:  Mateo Flow, MD  Cardiologist:  Jenean Lindau, MD   Referring MD: Mateo Flow, MD    ASSESSMENT:    1. Coronary artery disease involving native coronary artery of native heart without angina pectoris   2. 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 Dyslipidemia    PLAN:    In order of problems listed above:  1. Coronary artery disease: Secondary prevention stressed to the patient.  Importance of compliance with diet and medication stressed and she vocalized understanding. 2. Essential hypertension: Blood pressure is stable 3. Mixed dyslipidemia: Managed by primary care physician 4. Patient will be seen in follow-up appointment in 6 months or earlier if the patient has any concerns    Medication Adjustments/Labs and Tests Ordered: Current medicines are reviewed at length with the patient today.  Concerns regarding medicines are outlined above.  No orders of the defined types were placed in this encounter.  No orders of the defined types were placed in this encounter.    No chief complaint on file.    History of Present Illness:    Sheila [name] is a 78 y.o. female.  Patient has past medical history of coronary artery disease, essential hypertension and dyslipidemia.  She denies any problems at this time and takes care of activities of daily living.  She was recently seen by my partner.  She had some issues of chest discomfort which were very atypical.  She describes them to me today.  She is tolerating the isosorbide well.  Her blood pressure is fine and is checked repeatedly at home and at her primary care physician's office and she mentioned to me the numbers and they are fine.  Past Medical History:  Diagnosis Date  . Allergy   . Anxiety   . Arthritis   . Asthma   . Blood transfusion without reported diagnosis   . Cataract   .  Chronic headaches   . Depression   . GERD (gastroesophageal reflux disease)   . Glaucoma   . Heart murmur   . Hyperlipidemia   . Hypertension     Past Surgical History:  Procedure Laterality Date  . CESAREAN SECTION    . COLONOSCOPY  05/16/2015   Moderate sigmoid diverticulosis.   Marland Kitchen EYE SURGERY    . LEFT HEART CATH AND CORONARY ANGIOGRAPHY N/A 12/28/2017   Procedure: LEFT HEART CATH AND CORONARY ANGIOGRAPHY;  Surgeon: Belva Crome, MD;  Location: [facility];  Service: Cardiovascular;  Laterality: N/A;  . MASTOIDECTOMY    . PATENT DUCTUS ARTERIOUS REPAIR    . SHOULDER OPEN ROTATOR CUFF REPAIR Bilateral     Current Medications: Current Meds  Medication Sig  . acetaminophen (TYLENOL) 500 MG tablet Take 500 mg by mouth every 6 (six) hours as needed.  . ALPRAZolam (XANAX) 0.25 MG tablet Take 0.25 mg by mouth 2 (two) times daily as needed for anxiety.  Marland Kitchen amLODipine (NORVASC) 5 MG tablet Take 1 tablet (5 mg total) by mouth daily.  Marland Kitchen aspirin EC 81 MG tablet Take 81 mg by mouth daily.  . brimonidine (ALPHAGAN) 0.15 % ophthalmic solution Place 1 drop into both eyes every 12 (twelve) hours.   . carvedilol (COREG) 6.25 MG tablet Take 6.25 mg by mouth 2 (two) times daily with a meal.  . Coenzyme Q10 (CO Q-10)  200 MG CAPS Take 1 capsule by mouth daily.   . dorzolamide (TRUSOPT) 2 % ophthalmic solution Place 1 drop into both eyes daily.   . fluticasone (FLONASE) 50 MCG/ACT nasal spray Place 2 sprays into both nostrils 2 (two) times daily as needed.  . furosemide (LASIX) 40 MG tablet Take 40 mg by mouth daily as needed.  . isosorbide mononitrate (IMDUR) 30 MG 24 hr tablet Take 1 tablet (30 mg total) by mouth daily.  . meclizine (ANTIVERT) 12.5 MG tablet Take 12.5 mg by mouth 4 (four) times daily as needed for dizziness.  . pantoprazole (PROTONIX) 40 MG tablet Take 40 mg by mouth 2 (two) times daily.  . rosuvastatin (CRESTOR) 10 MG tablet Take 10 mg by mouth every Monday, Wednesday, and  Friday.  . sucralfate (CARAFATE) 1 g tablet Take 1 g by mouth 4 (four) times daily -  with meals and at bedtime.   . timolol (BETIMOL) 0.5 % ophthalmic solution Place 1 drop into both eyes 2 (two) times daily.   Marland Kitchen tiZANidine (ZANAFLEX) 2 MG tablet Take 2 mg by mouth at bedtime.   Current Facility-Administered Medications for the 08/25/19 encounter (Office Visit) with Karysa Heft, Reita Cliche, MD  Medication  . 0.9 %  sodium chloride infusion     Allergies:   Antihistamines, chlorpheniramine-type; Ativan [lorazepam]; Ceftin [cefuroxime axetil]; Meloxicam; Pheneen [benzalkonium chloride]; and Morphine   Social History   Socioeconomic History  . Marital status: Divorced    Spouse name: Not on file  . Number of children: 5  . Years of education: Not on file  . Highest education level: Not on file  Occupational History  . Occupation: Retired   Tobacco Use  . Smoking status: Never Smoker  . Smokeless tobacco: Never Used  Substance and Sexual Activity  . Alcohol use: No  . Drug use: Never  . Sexual activity: Not on file  Other Topics Concern  . Not on file  Social History Narrative  . Not on file   Social Determinants of Health   Financial Resource Strain:   . Difficulty of Paying Living Expenses: Not on file  Food Insecurity:   . Worried About Charity fundraiser in the Last Year: Not on file  . Ran Out of Food in the Last Year: Not on file  Transportation Needs:   . Lack of Transportation (Medical): Not on file  . Lack of Transportation (Non-Medical): Not on file  Physical Activity:   . Days of Exercise per Week: Not on file  . Minutes of Exercise per Session: Not on file  Stress:   . Feeling of Stress : Not on file  Social Connections:   . Frequency of Communication with Friends and Family: Not on file  . Frequency of Social Gatherings with Friends and Family: Not on file  . Attends Religious Services: Not on file  . Active Member of Clubs or Organizations: Not on file  .  Attends Archivist Meetings: Not on file  . Marital Status: Not on file     Family History: The patient's family history includes Colon cancer in her brother; Diabetes in her brother and daughter; Heart attack in her mother and sister; Stomach cancer in her sister. There is no history of Esophageal cancer.  ROS:   Please see the history of present illness.    All other systems reviewed and are negative.  EKGs/Labs/Other Studies Reviewed:    The following studies were reviewed today: Study Conclusions  -  Left ventricle: The cavity size was normal. Wall thickness was   increased in a pattern of moderate LVH. There was moderate   concentric hypertrophy. Systolic function was normal. The   estimated ejection fraction was in the range of 55% to 60%. Wall   motion was normal; there were no regional wall motion   abnormalities. Features are consistent with a pseudonormal left   ventricular filling pattern, with concomitant abnormal relaxation   and increased filling pressure (grade 2 diastolic dysfunction).   Doppler parameters are consistent with high ventricular filling   pressure. - Aortic valve: Trileaflet; moderately thickened, mildly calcified   leaflets. Cusp separation was normal. There was mild   regurgitation. Valve area (VTI): 2.13 cm^2. Valve area (Vmax):   1.93 cm^2. Valve area (Vmean): 2.21 cm^2. - Left atrium: The atrium was mildly dilated.   Study date: 12/28/17  MyChart Results Release  MyChart Status: Pending Results Release  Physicians  Panel Physicians Referring Physician Case Authorizing Physician  Belva Crome, MD (Primary)    Procedures  LEFT HEART CATH AND CORONARY ANGIOGRAPHY  Conclusion   25-30% ostial LAD.  Luminal irregularities throughout the mid circumflex less than 20%.  Normal left main  Nondominant right coronary without obstruction  Normal systolic function with EF greater than 60%.  Near complete cavity obliteration  during systole raising question of hypertrophic cardiomyopathy.  RECOMMENDATIONS:   Because of plaque in the ostial LAD would recommend aggressive lipid-lowering.  Consider 2D Doppler echocardiogram to assess wall thickness and rule out hypertrophic cardiomyopathy.      Recent Labs: 11/09/2018: ALT 10; BUN 15; Creatinine, Ser 0.69; Potassium 4.0; Sodium 140  Recent Lipid Panel No results found for: CHOL, TRIG, HDL, CHOLHDL, VLDL, LDLCALC, LDLDIRECT  Physical Exam:    VS:  BP (!) 156/68   Pulse 78   Ht 5' 3.5" (1.613 m)   Wt 164 lb 9.6 oz (74.7 kg)   BMI 28.70 kg/m     Wt Readings from Last 3 Encounters:  08/25/19 164 lb 9.6 oz (74.7 kg)  07/26/19 160 lb 3.4 oz (72.7 kg)  03/18/19 164 lb (74.4 kg)     GEN: Patient is in no acute distress HEENT: Normal NECK: No JVD; No carotid bruits LYMPHATICS: No lymphadenopathy CARDIAC: Hear sounds regular, 2/6 systolic murmur at the apex. RESPIRATORY:  Clear to auscultation without rales, wheezing or rhonchi  ABDOMEN: Soft, non-tender, non-distended MUSCULOSKELETAL:  No edema; No deformity  SKIN: Warm and dry NEUROLOGIC:  Alert and oriented x 3 PSYCHIATRIC:  Normal affect   Signed, Jenean Lindau, MD  08/25/2019 9:30 AM    [place]

## 2019-08-25 NOTE — Patient Instructions (Signed)
Medication I nstructions:  Your physician recommends that you continue on your current medications as directed. Please refer to the Current Medication list given to you today.  *If you need a refill on your cardiac medications before your next appointment, please call your pharmacy*  Lab Work: None Ordered  Testing/Procedures: None Ordered   Follow-Up: At [facility], you and your health needs are our priority.  As part of our continuing mission to provide you with exceptional heart care, we have created designated Provider Care Teams.  These Care Teams include your primary Cardiologist (physician) and Advanced Practice Providers (APPs -  Physician Assistants and Nurse Practitioners) who all work together to provide you with the care you need, when you need it.  Your next appointment:   6 month(s)  The format for your next appointment:   In Person  Provider:   Rajan Revankar, MD  

## 2019-09-08 DIAGNOSIS — I10 Essential (primary) hypertension: Secondary | ICD-10-CM | POA: Diagnosis not present

## 2019-09-08 DIAGNOSIS — E782 Mixed hyperlipidemia: Secondary | ICD-10-CM | POA: Diagnosis not present

## 2019-09-14 DIAGNOSIS — Z6829 Body mass index (BMI) 29.0-29.9, adult: Secondary | ICD-10-CM | POA: Diagnosis not present

## 2019-09-14 DIAGNOSIS — I951 Orthostatic hypotension: Secondary | ICD-10-CM | POA: Diagnosis not present

## 2019-09-17 DIAGNOSIS — E261 Secondary hyperaldosteronism: Secondary | ICD-10-CM | POA: Diagnosis not present

## 2019-09-17 DIAGNOSIS — I25118 Atherosclerotic heart disease of native coronary artery with other forms of angina pectoris: Secondary | ICD-10-CM | POA: Diagnosis not present

## 2019-09-17 DIAGNOSIS — I110 Hypertensive heart disease with heart failure: Secondary | ICD-10-CM | POA: Diagnosis not present

## 2019-09-17 DIAGNOSIS — J449 Chronic obstructive pulmonary disease, unspecified: Secondary | ICD-10-CM | POA: Diagnosis not present

## 2019-09-17 DIAGNOSIS — I509 Heart failure, unspecified: Secondary | ICD-10-CM | POA: Diagnosis not present

## 2019-09-17 DIAGNOSIS — I739 Peripheral vascular disease, unspecified: Secondary | ICD-10-CM | POA: Diagnosis not present

## 2019-09-17 DIAGNOSIS — Z7722 Contact with and (suspected) exposure to environmental tobacco smoke (acute) (chronic): Secondary | ICD-10-CM | POA: Diagnosis not present

## 2019-09-17 DIAGNOSIS — F321 Major depressive disorder, single episode, moderate: Secondary | ICD-10-CM | POA: Diagnosis not present

## 2019-09-19 DIAGNOSIS — R0981 Nasal congestion: Secondary | ICD-10-CM | POA: Diagnosis not present

## 2019-09-19 DIAGNOSIS — Z20828 Contact with and (suspected) exposure to other viral communicable diseases: Secondary | ICD-10-CM | POA: Diagnosis not present

## 2019-09-23 DIAGNOSIS — J069 Acute upper respiratory infection, unspecified: Secondary | ICD-10-CM | POA: Diagnosis not present

## 2019-10-04 DIAGNOSIS — K222 Esophageal obstruction: Secondary | ICD-10-CM | POA: Diagnosis not present

## 2019-10-04 DIAGNOSIS — Z6829 Body mass index (BMI) 29.0-29.9, adult: Secondary | ICD-10-CM | POA: Diagnosis not present

## 2019-10-04 DIAGNOSIS — K219 Gastro-esophageal reflux disease without esophagitis: Secondary | ICD-10-CM | POA: Diagnosis not present

## 2019-10-07 ENCOUNTER — Other Ambulatory Visit: Payer: Self-pay | Admitting: *Deleted

## 2019-10-07 ENCOUNTER — Telehealth: Payer: Self-pay | Admitting: Cardiology

## 2019-10-07 DIAGNOSIS — H402212 Chronic angle-closure glaucoma, right eye, moderate stage: Secondary | ICD-10-CM | POA: Diagnosis not present

## 2019-10-07 MED ORDER — ISOSORBIDE MONONITRATE ER 30 MG PO TB24: 30.0000 mg | ORAL_TABLET | Freq: Every day | ORAL | 1 refills | Status: DC

## 2019-10-07 NOTE — Telephone Encounter (Signed)
Refill sent.

## 2019-10-07 NOTE — Telephone Encounter (Signed)
New Message      *STAT* If patient is at the pharmacy, call can be transferred to refill team.   1. Which medications need to be refilled? (please list name of each medication and dose if known) isosorbide mononitrate (IMDUR) 30 MG 24 hr tablet  2. Which pharmacy/location (including street and city if local pharmacy) is medication to be sent to? [facility] - [place], [place] - [place] [address]  3. Do they need a 30 day or 90 day supply? [place]

## 2019-10-08 DIAGNOSIS — K219 Gastro-esophageal reflux disease without esophagitis: Secondary | ICD-10-CM | POA: Diagnosis not present

## 2019-10-08 DIAGNOSIS — H409 Unspecified glaucoma: Secondary | ICD-10-CM | POA: Diagnosis not present

## 2019-10-08 DIAGNOSIS — I10 Essential (primary) hypertension: Secondary | ICD-10-CM | POA: Diagnosis not present

## 2019-10-13 DIAGNOSIS — R079 Chest pain, unspecified: Secondary | ICD-10-CM | POA: Diagnosis not present

## 2019-10-13 DIAGNOSIS — Z79899 Other long term (current) drug therapy: Secondary | ICD-10-CM | POA: Diagnosis not present

## 2019-10-13 DIAGNOSIS — Z7982 Long term (current) use of aspirin: Secondary | ICD-10-CM | POA: Diagnosis not present

## 2019-10-13 DIAGNOSIS — I10 Essential (primary) hypertension: Secondary | ICD-10-CM | POA: Diagnosis not present

## 2019-10-13 DIAGNOSIS — R0600 Dyspnea, unspecified: Secondary | ICD-10-CM | POA: Diagnosis not present

## 2019-10-13 DIAGNOSIS — K219 Gastro-esophageal reflux disease without esophagitis: Secondary | ICD-10-CM | POA: Diagnosis not present

## 2019-10-13 DIAGNOSIS — I2510 Atherosclerotic heart disease of native coronary artery without angina pectoris: Secondary | ICD-10-CM | POA: Diagnosis not present

## 2019-10-14 DIAGNOSIS — R079 Chest pain, unspecified: Secondary | ICD-10-CM | POA: Diagnosis not present

## 2019-10-14 DIAGNOSIS — R0602 Shortness of breath: Secondary | ICD-10-CM | POA: Diagnosis not present

## 2019-10-14 DIAGNOSIS — K219 Gastro-esophageal reflux disease without esophagitis: Secondary | ICD-10-CM | POA: Diagnosis not present

## 2019-10-14 DIAGNOSIS — K7689 Other specified diseases of liver: Secondary | ICD-10-CM | POA: Diagnosis not present

## 2019-10-14 DIAGNOSIS — I10 Essential (primary) hypertension: Secondary | ICD-10-CM | POA: Diagnosis not present

## 2019-10-14 DIAGNOSIS — R0600 Dyspnea, unspecified: Secondary | ICD-10-CM | POA: Diagnosis not present

## 2019-10-18 ENCOUNTER — Other Ambulatory Visit: Payer: Self-pay

## 2019-10-18 ENCOUNTER — Telehealth (INDEPENDENT_AMBULATORY_CARE_PROVIDER_SITE_OTHER): Payer: PPO | Admitting: Gastroenterology

## 2019-10-18 ENCOUNTER — Encounter: Payer: Self-pay | Admitting: Gastroenterology

## 2019-10-18 VITALS — Ht 63.0 in | Wt 158.0 lb

## 2019-10-18 DIAGNOSIS — R1013 Epigastric pain: Secondary | ICD-10-CM

## 2019-10-18 MED ORDER — PANTOPRAZOLE SODIUM 40 MG PO TBEC: 40.0000 mg | DELAYED_RELEASE_TABLET | Freq: Two times a day (BID) | ORAL | 11 refills | Status: DC

## 2019-10-18 NOTE — Progress Notes (Signed)
Chief Complaint: FU  Referring Provider:  Mateo Flow, MD      ASSESSMENT AND PLAN;   #1. GERD with eso dysphagia d/t Schatzki's ring s/p dil 50Fr 11/2018 with complete resolution. #2. Epi pain/NCCP. Neg cardiac Stress test 10/2019, neg [place] 10/2019. Neg CT at [facility]  11/2018. Nl CBC, CMP #3. Constipation -likely exacerbated by medications like amlodipine (better). #4. FH colon cancer (brother with stage IV colon cancer at age 79). Neg colon 11/2018 except for mod sigm div.  Plan: - HIDA with EF. - Solid phase GES. - Protonix 40mg  po BID. - Miralax 17 g p.o. once a day if with any constipation. Can continue linzess. - FU in 4 weeks, once above work-up is complete.  If still with problems, consider MRCP.  HPI:    Sheila [name] is a 79 y.o. female  For follow-up visit from [facility] (adm 2/4-10/14/2019) d/t chest pain/epigastric pain. Negative Lexiscan stress test (EF 64%), neg RUQ [place]. Nl CBC, CMP and lipase. Told to follow-up in the GI clinic.   Has postprandial abdominal bloating and epigastric pain, occasionally radiating to the back.  No significant heartburn as long as she is taking Protonix 40 mg p.o. twice daily.  No further dysphagia.  Constipation is better on MiraLAX and Linzess.  Has several nonspecific symptoms as well.  Has appointment with Dr. Humphrey Rolls tomorrow.    From previous records: 2020- ED at [facility] with chest pains-found to have mild pneumonia, treated with Levaquin.  Subsequent CT chest on 02/11/2019 showed complete resolution of previous pulmonary abnormalities but showed new scattered groundglass opacities measuring 7 mm in the right lung.  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   Past GI procedures: -Colonoscopy 11/2018 ([facility])-moderate sigmoid diverticulosis.  6 mm polyp status post polypectomy. Bx- neg -EGD 11/2018 Schatzki's ring status post esophageal dilatation 50 Fr, small hiatal hernia, few  duodenal erosions.  Negative biopsies for HP. -CT Abdo/pelvis with contrast: 11/11/2018-neg except for DJD thoracolumbar spine, calcified uterine fibroids, sigmoid div without diverticulitis. - CT chest 02/11/2019: 3.6 cm ascending thoracic aortic aneurysm, 3.7 cm descending thoracic aortic aneurysm, coronary calcification.  Being followed by Dr. Chancy Milroy and Dr. Geraldo Pitter. Past Medical History:  Diagnosis Date  . Allergy   . Anxiety   . Arthritis   . Asthma   . Blood transfusion without reported diagnosis   . Cataract   . Chronic headaches   . Depression   . GERD (gastroesophageal reflux disease)   . Glaucoma   . Heart murmur   . Hyperlipidemia   . Hypertension     Past Surgical History:  Procedure Laterality Date  . CESAREAN SECTION    . COLONOSCOPY  05/16/2015   Moderate sigmoid diverticulosis.   Marland Kitchen EYE SURGERY    . LEFT HEART CATH AND CORONARY ANGIOGRAPHY N/A 12/28/2017   Procedure: LEFT HEART CATH AND CORONARY ANGIOGRAPHY;  Surgeon: Belva Crome, MD;  Location: [facility];  Service: Cardiovascular;  Laterality: N/A;  . MASTOIDECTOMY    . PATENT DUCTUS ARTERIOUS REPAIR    . SHOULDER OPEN ROTATOR CUFF REPAIR Bilateral     Family History  Problem Relation Age of Onset  . Heart attack Mother   . Heart attack Sister   . Colon cancer Brother   . Diabetes Brother   . Diabetes Daughter   . Stomach cancer Sister   . Esophageal cancer Neg Hx     Social  History   Tobacco Use  . Smoking status: Never Smoker  . Smokeless tobacco: Never Used  Substance Use Topics  . Alcohol use: No  . Drug use: Never    Current Outpatient Medications  Medication Sig Dispense Refill  . acetaminophen (TYLENOL) 500 MG tablet Take 500 mg by mouth every 6 (six) hours as needed.    . ALPRAZolam (XANAX) 0.25 MG tablet Take 0.25 mg by mouth 2 (two) times daily as needed for anxiety.    Marland Kitchen amLODipine (NORVASC) 5 MG tablet Take 1 tablet (5 mg total) by mouth daily. (Patient taking differently:  Take 2.5 mg by mouth daily. ) 90 tablet 0  . aspirin EC 81 MG tablet Take 81 mg by mouth daily.    . brimonidine (ALPHAGAN) 0.15 % ophthalmic solution Place 1 drop into both eyes every 12 (twelve) hours.     . carvedilol (COREG) 6.25 MG tablet Take 6.25 mg by mouth 2 (two) times daily with a meal.    . Coenzyme Q10 (CO Q-10) 200 MG CAPS Take 1 capsule by mouth daily.     . dorzolamide (TRUSOPT) 2 % ophthalmic solution Place 1 drop into both eyes daily.     . famotidine (PEPCID) 20 MG tablet Take 20 mg by mouth 2 (two) times daily.    . furosemide (LASIX) 40 MG tablet Take 40 mg by mouth daily as needed.  6  . isosorbide mononitrate (IMDUR) 30 MG 24 hr tablet Take 1 tablet (30 mg total) by mouth daily. 90 tablet 1  . LINZESS 72 MCG capsule Take 72 mcg by mouth as needed.    . meclizine (ANTIVERT) 12.5 MG tablet Take 12.5 mg by mouth 4 (four) times daily as needed for dizziness. Was given 25mg  tablet to break in half    . mometasone (NASONEX) 50 MCG/ACT nasal spray 1 spray 2 (two) times daily.    . nitroGLYCERIN (NITROSTAT) 0.4 MG SL tablet Place 1 tablet (0.4 mg total) under the tongue every 5 (five) minutes as needed for chest pain. 25 tablet 11  . pantoprazole (PROTONIX) 40 MG tablet Take 40 mg by mouth 2 (two) times daily.    . rosuvastatin (CRESTOR) 10 MG tablet Take 10 mg by mouth as directed. Take it every Monday, Wednesday, Friday and Saturday    . sodium chloride (MURO 128) 2 % ophthalmic solution Place 1 drop into the right eye 2 (two) times daily.    . sucralfate (CARAFATE) 1 g tablet Take 1 g by mouth 3 (three) times daily.     . timolol (BETIMOL) 0.5 % ophthalmic solution Place 1 drop into both eyes 2 (two) times daily.     Marland Kitchen tiZANidine (ZANAFLEX) 2 MG tablet Take 2 mg by mouth at bedtime.    . Wheat Dextrin (BENEFIBER PO) Take by mouth 2 (two) times daily.    . fluticasone (FLONASE) 50 MCG/ACT nasal spray Place 2 sprays into both nostrils 2 (two) times daily as needed.    .  prednisoLONE acetate (PRED FORTE) 1 % ophthalmic suspension Place 1 drop into the right eye 2 (two) times daily.     Current Facility-Administered Medications  Medication Dose Route Frequency Provider Last Rate Last Admin  . 0.9 %  sodium chloride infusion  500 mL Intravenous Once Jackquline Denmark, MD        Allergies  Allergen Reactions  . Antihistamines, Chlorpheniramine-Type Anaphylaxis  . Ativan [Lorazepam] Swelling  . Ceftin [Cefuroxime Axetil]   . Meloxicam   .  Pheneen [Benzalkonium Chloride]   . Morphine Palpitations    Review of Systems:  neg     Physical Exam:    Ht 5\' 3"  (1.6 m)   Wt 158 lb (71.7 kg)   BMI 27.99 kg/m  Filed Weights   10/18/19 0850  Weight: 158 lb (71.7 kg)   televisit  Data Reviewed: I have personally reviewed following labs and imaging studies  CBC: CBC Latest Ref Rng & Units 12/28/2017  Hemoglobin 12.0 - 15.0 g/dL 13.3  Hematocrit 36.0 - 46.0 % 39.0    CMP: CMP Latest Ref Rng & Units 11/09/2018 02/04/2018 12/28/2017  Glucose 70 - 99 mg/dL 130(H) 104(H) 103(H)  BUN 6 - 23 mg/dL 15 16 15   Creatinine 0.40 - 1.20 mg/dL 0.69 0.67 0.70  Sodium 135 - 145 mEq/L 140 141 141  Potassium 3.5 - 5.1 mEq/L 4.0 4.2 3.5  Chloride 96 - 112 mEq/L 102 103 101  CO2 19 - 32 mEq/L 30 24 -  Calcium 8.4 - 10.5 mg/dL 9.3 9.6 -  Total Protein 6.0 - 8.3 g/dL 6.6 - -  Total Bilirubin 0.2 - 1.2 mg/dL 0.4 - -  Alkaline Phos 39 - 117 U/L 84 - -  AST 0 - 37 U/L 11 - -  ALT 0 - 35 U/L 10 - -   This service was provided via telemedicine. Video-doxy failed.  The patient was located at home.  The provider was located in office.  The patient did consent to this telephone visit and is aware of possible charges through their insurance for this visit. The other persons participating in this telemedicine service were grandson and their role was coordination of care.  Time spent on call/review of records 25 min  Carmell Austria, MD 10/18/2019, 4:19 PM  Cc: Mateo Flow, MD

## 2019-10-18 NOTE — Addendum Note (Signed)
Addended by: Karena Addison on: 10/18/2019 04:41 PM   Modules accepted: Orders

## 2019-10-18 NOTE — Patient Instructions (Signed)
If you are age 78 or older, your body mass index should be between 23-30. Your Body mass index is 27.99 kg/m. If this is out of the aforementioned range listed, please consider follow up with your Primary Care Provider.  If you are age 53 or younger, your body mass index should be between 19-25. Your Body mass index is 27.99 kg/m. If this is out of the aformentioned range listed, please consider follow up with your Primary Care Provider.   You have been scheduled for a gastric emptying scan at [facility] on 11/08/19 at 7:30am. Please arrive at least 15 minutes prior to your appointment for registration. Please make certain not to have anything to eat or drink after midnight the night before your test. Hold all stomach medications (ex: Zofran, phenergan, Reglan) 48 hours prior to your test. If you need to reschedule your appointment, please contact radiology scheduling at [phone]. _____________________________________________________________________ A gastric-emptying study measures how long it takes for food to move through your stomach. There are several ways to measure stomach emptying. In the most common test, you eat food that contains a small amount of radioactive material. A scanner that detects the movement of the radioactive material is placed over your abdomen to monitor the rate at which food leaves your stomach. This test normally takes about 4 hours to complete. _____________________________________________________________________   Dennis Bast have been scheduled for a HIDA scan at [facility] (1st floor) on 10/24/19. Please arrive 15 minutes prior to your scheduled appointment at  8am. Make certain not to have anything to eat or drink at least 6 hours prior to your test. Should this appointment date or time not work well for you, please call radiology scheduling at [phone].  _____________________________________________________________________ hepatobiliary (HIDA) scan  is an imaging procedure used to diagnose problems in the liver, gallbladder and bile ducts. In the HIDA scan, a radioactive chemical or tracer is injected into a vein in your arm. The tracer is handled by the liver like bile. Bile is a fluid produced and excreted by your liver that helps your digestive system break down fats in the foods you eat. Bile is stored in your gallbladder and the gallbladder releases the bile when you eat a meal. A special nuclear medicine scanner (gamma camera) tracks the flow of the tracer from your liver into your gallbladder and small intestine.  During your HIDA scan  You'll be asked to change into a hospital gown before your HIDA scan begins. Your health care team will position you on a table, usually on your back. The radioactive tracer is then injected into a vein in your arm.The tracer travels through your bloodstream to your liver, where it's taken up by the bile-producing cells. The radioactive tracer travels with the bile from your liver into your gallbladder and through your bile ducts to your small intestine.You may feel some pressure while the radioactive tracer is injected into your vein. As you lie on the table, a special gamma camera is positioned over your abdomen taking pictures of the tracer as it moves through your body. The gamma camera takes pictures continually for about an hour. You'll need to keep still during the HIDA scan. This can become uncomfortable, but you may find that you can lessen the discomfort by taking deep breaths and thinking about other things. Tell your health care team if you're uncomfortable. The radiologist will watch on a computer the progress of the radioactive tracer through your body. The HIDA scan may be  stopped when the radioactive tracer is seen in the gallbladder and enters your small intestine. This typically takes about an hour. In some cases extra imaging will be performed if original images aren't satisfactory, if morphine is  given to help visualize the gallbladder or if the medication CCK is given to look at the contraction of the gallbladder. This test typically takes 2 hours to complete. ________________________________________________________________________  We have sent the following medications to your pharmacy for you to pick up at your convenience: Protonix  Thank you,  Dr. Jackquline Denmark

## 2019-10-19 DIAGNOSIS — R0600 Dyspnea, unspecified: Secondary | ICD-10-CM | POA: Diagnosis not present

## 2019-10-19 DIAGNOSIS — Z6829 Body mass index (BMI) 29.0-29.9, adult: Secondary | ICD-10-CM | POA: Diagnosis not present

## 2019-10-24 ENCOUNTER — Encounter (HOSPITAL_COMMUNITY)
Admission: RE | Admit: 2019-10-24 | Discharge: 2019-10-24 | Disposition: A | Discharge status: Home / Self Care | Payer: PPO | Source: Ambulatory Visit | Attending: Gastroenterology | Admitting: Gastroenterology

## 2019-10-24 ENCOUNTER — Other Ambulatory Visit: Payer: Self-pay

## 2019-10-24 DIAGNOSIS — R1013 Epigastric pain: Secondary | ICD-10-CM

## 2019-10-24 DIAGNOSIS — R109 Unspecified abdominal pain: Secondary | ICD-10-CM | POA: Diagnosis not present

## 2019-10-24 MED ORDER — TECHNETIUM TC 99M MEBROFENIN IV KIT
5.4000 | PACK | Freq: Once | INTRAVENOUS | Status: AC | PRN
  Administered 2019-10-24: 08:00:00 5.4 via INTRAVENOUS

## 2019-10-31 DIAGNOSIS — Z1231 Encounter for screening mammogram for malignant neoplasm of breast: Secondary | ICD-10-CM | POA: Diagnosis not present

## 2019-11-06 DIAGNOSIS — K219 Gastro-esophageal reflux disease without esophagitis: Secondary | ICD-10-CM | POA: Diagnosis not present

## 2019-11-06 DIAGNOSIS — H409 Unspecified glaucoma: Secondary | ICD-10-CM | POA: Diagnosis not present

## 2019-11-08 ENCOUNTER — Encounter (HOSPITAL_COMMUNITY)
Admission: RE | Admit: 2019-11-08 | Discharge: 2019-11-08 | Disposition: A | Discharge status: Home / Self Care | Payer: PPO | Source: Ambulatory Visit | Attending: Gastroenterology | Admitting: Gastroenterology

## 2019-11-08 ENCOUNTER — Other Ambulatory Visit: Payer: Self-pay

## 2019-11-08 DIAGNOSIS — R1013 Epigastric pain: Secondary | ICD-10-CM

## 2019-11-08 DIAGNOSIS — K219 Gastro-esophageal reflux disease without esophagitis: Secondary | ICD-10-CM | POA: Diagnosis not present

## 2019-11-08 MED ORDER — TECHNETIUM TC 99M SULFUR COLLOID
1.9000 | Freq: Once | INTRAVENOUS | Status: AC | PRN
  Administered 2019-11-08: 1.9 via INTRAVENOUS

## 2019-11-09 ENCOUNTER — Other Ambulatory Visit: Payer: Self-pay

## 2019-11-09 DIAGNOSIS — K30 Functional dyspepsia: Secondary | ICD-10-CM

## 2019-11-09 MED ORDER — METOCLOPRAMIDE HCL 10 MG PO TABS: 10.0000 mg | ORAL_TABLET | Freq: Three times a day (TID) | ORAL | 0 refills | Status: DC

## 2019-11-14 ENCOUNTER — Telehealth: Payer: Self-pay | Admitting: Gastroenterology

## 2019-11-15 NOTE — Telephone Encounter (Signed)
I have called and spoke to patient, she says when she took the Reglan it made her feel really bad shes complaining of feeling jittery and having a headache and nausea. Patient states that she has stopped taking it now because she felt so bad but wants to know if there is something else she can take.

## 2019-11-16 NOTE — Telephone Encounter (Signed)
Lets try a gastroparesis diet Stop Reglan d/t side effects. Please ask her if she can try it twice a day and let us know if she has similar side effects  RG

## 2019-11-17 NOTE — Telephone Encounter (Signed)
I have called patient and went over Dr. Leland Her recommendations, patient voiced understanding. Mailed patient information regarding gastroparesis diet.

## 2019-11-18 DIAGNOSIS — H402212 Chronic angle-closure glaucoma, right eye, moderate stage: Secondary | ICD-10-CM | POA: Diagnosis not present

## 2019-11-21 ENCOUNTER — Encounter: Payer: Self-pay | Admitting: Emergency Medicine

## 2019-11-21 ENCOUNTER — Ambulatory Visit: Payer: PPO | Admitting: Emergency Medicine

## 2019-11-21 ENCOUNTER — Other Ambulatory Visit: Payer: Self-pay

## 2019-11-21 ENCOUNTER — Ambulatory Visit (INDEPENDENT_AMBULATORY_CARE_PROVIDER_SITE_OTHER): Payer: PPO

## 2019-11-21 DIAGNOSIS — R0600 Dyspnea, unspecified: Secondary | ICD-10-CM | POA: Insufficient documentation

## 2019-11-21 DIAGNOSIS — R0602 Shortness of breath: Secondary | ICD-10-CM

## 2019-11-21 HISTORY — DX: Dyspnea, unspecified: R06.00

## 2019-11-21 NOTE — Assessment & Plan Note (Signed)
Shortness of breath that dates back for over a year, seems to be progressing.  Could be a component of restriction due to her abdominal difficulties which include poor gastric emptying, recent Schatzki's ring with dysphagia.  Also she has an echocardiogram with left ventricular hypertrophy, question at risk for HOCM although she does not have any sequela of CHF on exam or by history.  She had asthma as a child/young adult, no symptoms for decades but she is at risk for fixed obstructive lung disease.  I believe this needs to be evaluated further.  We will perform a chest x-ray today for shortness of breath We will arrange for pulmonary function testing at your next office visit We will perform a walking oximetry today to ensure that you do not qualify for oxygen Okay to use your albuterol 2 puffs up to every 4 hours if you need it for shortness of breath, chest tightness, wheezing.  Keep track of how often you use it and whether it is beneficial so that we can discuss. Follow with Dr. Lamonte Sakai next available with full pulmonary function testing on the same day.

## 2019-11-21 NOTE — Addendum Note (Signed)
Addended by: Tery Sanfilippo R on: 11/21/2019 11:36 AM   Modules accepted: Orders

## 2019-11-21 NOTE — Progress Notes (Signed)
Subjective:    Patient ID: Sheila [name], female    DOB: [birth year], 79 y.o.   MRN: [id number]  HPI 79 year old never smoker with a history of CAD, hypertension, hyperlipidemia, esophageal dysphagia with a Schatzki's ring (dilated 11/2018), chronic epigastric pain been evaluated by gastroenterology. Also hx R encephalocele s/p crani.  She is referred today for dyspnea.  She reports that she began to have SOB for about the last year - she notes difficulty with stairs in her house, has even felt faint when climbing stairs. SOB with walking on flat ground about 50 ft. She underwent cardiac eval > stress testing reassuring in February at [place]. She did have TTE that suggested hypertrophy, ? Some evidence HOCM.  She occasionally hears wheeze, hears at night and w exertion. No cough or phlegm. She had asthma as a child - no sx for decades. She was just given albuterol a few weeks ago as a trial - it may help her some.    Review of Systems  Constitutional: Negative for fever and unexpected weight change.  HENT: Positive for ear pain and trouble swallowing. Negative for congestion, dental problem, nosebleeds, postnasal drip, rhinorrhea, sinus pressure, sneezing and sore throat.   Eyes: Negative for redness and itching.  Respiratory: Positive for chest tightness and shortness of breath. Negative for cough and wheezing.   Cardiovascular: Positive for palpitations. Negative for leg swelling.  Gastrointestinal: Negative for nausea and vomiting.  Genitourinary: Negative for dysuria.  Musculoskeletal: Positive for joint swelling.  Skin: Negative for rash.  Allergic/Immunologic: Negative.  Negative for environmental allergies, food allergies and immunocompromised state.  Neurological: Positive for headaches.  Hematological: Does not bruise/bleed easily.  Psychiatric/Behavioral: Positive for dysphoric mood. The patient is nervous/anxious.      Past Medical History:  Diagnosis Date  . Allergy   .  Anxiety   . Arthritis   . Asthma   . Blood transfusion without reported diagnosis   . Cataract   . Chronic headaches   . Depression   . GERD (gastroesophageal reflux disease)   . Glaucoma   . Heart murmur   . Hyperlipidemia   . Hypertension      Family History  Problem Relation Age of Onset  . Heart attack Mother   . Heart attack Sister   . Colon cancer Brother   . Diabetes Brother   . Diabetes Daughter   . Stomach cancer Sister   . Esophageal cancer Neg Hx      Social History   Socioeconomic History  . Marital status: Divorced    Spouse name: Not on file  . Number of children: 5  . Years of education: Not on file  . Highest education level: Not on file  Occupational History  . Occupation: Retired   Tobacco Use  . Smoking status: Never Smoker  . Smokeless tobacco: Never Used  Substance and Sexual Activity  . Alcohol use: No  . Drug use: Never  . Sexual activity: Not on file  Other Topics Concern  . Not on file  Social History Narrative  . Not on file   Social Determinants of Health   Financial Resource Strain:   . Difficulty of Paying Living Expenses:   Food Insecurity:   . Worried About Charity fundraiser in the Last Year:   . Arboriculturist in the Last Year:   Transportation Needs:   . Film/video editor (Medical):   Marland Kitchen Lack of Transportation (Non-Medical):   Physical Activity:   .  Days of Exercise per Week:   . Minutes of Exercise per Session:   Stress:   . Feeling of Stress :   Social Connections:   . Frequency of Communication with Friends and Family:   . Frequency of Social Gatherings with Friends and Family:   . Attends Religious Services:   . Active Member of Clubs or Organizations:   . Attends Archivist Meetings:   Marland Kitchen Marital Status:   Intimate Partner Violence:   . Fear of Current or Ex-Partner:   . Emotionally Abused:   Marland Kitchen Physically Abused:   . Sexually Abused:    She was a CNA for 40 yrs.  No other exposures.    Allergies  Allergen Reactions  . Antihistamines, Chlorpheniramine-Type Anaphylaxis  . Ativan [Lorazepam] Swelling  . Ceftin [Cefuroxime Axetil]   . Meloxicam   . Pheneen [Benzalkonium Chloride]   . Morphine Palpitations     Outpatient Medications Prior to Visit  Medication Sig Dispense Refill  . acetaminophen (TYLENOL) 500 MG tablet Take 500 mg by mouth every 6 (six) hours as needed.    . ALPRAZolam (XANAX) 0.25 MG tablet Take 0.25 mg by mouth 2 (two) times daily as needed for anxiety.    Marland Kitchen aspirin EC 81 MG tablet Take 81 mg by mouth daily.    . brimonidine (ALPHAGAN) 0.15 % ophthalmic solution Place 1 drop into both eyes every 12 (twelve) hours.     . carvedilol (COREG) 6.25 MG tablet Take 6.25 mg by mouth 2 (two) times daily with a meal.    . Coenzyme Q10 (CO Q-10) 200 MG CAPS Take 1 capsule by mouth daily.     . dorzolamide (TRUSOPT) 2 % ophthalmic solution Place 1 drop into both eyes daily.     . famotidine (PEPCID) 20 MG tablet Take 20 mg by mouth 2 (two) times daily.    . fluticasone (FLONASE) 50 MCG/ACT nasal spray Place 2 sprays into both nostrils 2 (two) times daily as needed.    . furosemide (LASIX) 40 MG tablet Take 40 mg by mouth daily as needed.  6  . isosorbide mononitrate (IMDUR) 30 MG 24 hr tablet Take 1 tablet (30 mg total) by mouth daily. 90 tablet 1  . LINZESS 72 MCG capsule Take 72 mcg by mouth as needed.    . meclizine (ANTIVERT) 12.5 MG tablet Take 12.5 mg by mouth 4 (four) times daily as needed for dizziness. Was given 25mg  tablet to break in half    . metoCLOPramide (REGLAN) 10 MG tablet Take 1 tablet (10 mg total) by mouth 3 (three) times daily before meals for 14 days. 42 tablet 0  . mometasone (NASONEX) 50 MCG/ACT nasal spray 1 spray 2 (two) times daily.    . nitroGLYCERIN (NITROSTAT) 0.4 MG SL tablet Place 1 tablet (0.4 mg total) under the tongue every 5 (five) minutes as needed for chest pain. 25 tablet 11  . pantoprazole (PROTONIX) 40 MG tablet Take 1  tablet (40 mg total) by mouth 2 (two) times daily. 60 tablet 11  . prednisoLONE acetate (PRED FORTE) 1 % ophthalmic suspension Place 1 drop into the right eye 2 (two) times daily.    . rosuvastatin (CRESTOR) 10 MG tablet Take 10 mg by mouth as directed. Take it every Monday, Wednesday, Friday and Saturday    . sodium chloride (MURO 128) 2 % ophthalmic solution Place 1 drop into the right eye 2 (two) times daily.    . sucralfate (CARAFATE) 1 g tablet  Take 1 g by mouth 3 (three) times daily.     . timolol (BETIMOL) 0.5 % ophthalmic solution Place 1 drop into both eyes 2 (two) times daily.     Marland Kitchen tiZANidine (ZANAFLEX) 2 MG tablet Take 2 mg by mouth at bedtime.    . Wheat Dextrin (BENEFIBER PO) Take by mouth 2 (two) times daily.    Marland Kitchen amLODipine (NORVASC) 5 MG tablet Take 1 tablet (5 mg total) by mouth daily. (Patient taking differently: Take 2.5 mg by mouth daily. ) 90 tablet 0   Facility-Administered Medications Prior to Visit  Medication Dose Route Frequency Provider Last Rate Last Admin  . 0.9 %  sodium chloride infusion  500 mL Intravenous Once Jackquline Denmark, MD            Objective:   Physical Exam Vitals:   11/21/19 1107  BP: 140/64  Pulse: 78  Temp: 97.7 F (36.5 C)  TempSrc: Temporal  SpO2: 100%  Weight: 166 lb 12.8 oz (75.7 kg)  Height: 5\' 3"  (1.6 m)  Gen: Pleasant, well-nourished, in no distress,  normal affect  ENT: No lesions,  mouth clear,  oropharynx clear, no postnasal drip  Neck: No JVD, no stridor  Lungs: No use of accessory muscles, no crackles or wheezing on normal respiration, no wheeze on forced expiration  Cardiovascular: RRR, heart sounds normal, no murmur or gallops, no peripheral edema  Musculoskeletal: No deformities, no cyanosis or clubbing  Neuro: alert, awake, non focal  Skin: Warm, no lesions or rash      Assessment & Plan:   Dyspnea Shortness of breath that dates back for over a year, seems to be progressing.  Could be a component of  restriction due to her abdominal difficulties which include poor gastric emptying, recent Schatzki's ring with dysphagia.  Also she has an echocardiogram with left ventricular hypertrophy, question at risk for HOCM although she does not have any sequela of CHF on exam or by history.  She had asthma as a child/young adult, no symptoms for decades but she is at risk for fixed obstructive lung disease.  I believe this needs to be evaluated further.  We will perform a chest x-ray today for shortness of breath We will arrange for pulmonary function testing at your next office visit We will perform a walking oximetry today to ensure that you do not qualify for oxygen Okay to use your albuterol 2 puffs up to every 4 hours if you need it for shortness of breath, chest tightness, wheezing.  Keep track of how often you use it and whether it is beneficial so that we can discuss. Follow with Dr. Lamonte Sakai next available with full pulmonary function testing on the same day.  Baltazar Apo, MD, PhD 11/21/2019, 11:32 AM [facility] [phone] or if no answer [phone]

## 2019-11-21 NOTE — Patient Instructions (Signed)
We will perform a chest x-ray today for shortness of breath We will arrange for pulmonary function testing at your next office visit We will perform a walking oximetry today to ensure that you do not qualify for oxygen Okay to use your albuterol 2 puffs up to every 4 hours if you need it for shortness of breath, chest tightness, wheezing.  Keep track of how often you use it and whether it is beneficial so that we can discuss. Follow with Dr. Lamonte Sakai next available with full pulmonary function testing on the same day.

## 2019-11-30 DIAGNOSIS — R0600 Dyspnea, unspecified: Secondary | ICD-10-CM | POA: Diagnosis not present

## 2019-11-30 DIAGNOSIS — R609 Edema, unspecified: Secondary | ICD-10-CM | POA: Diagnosis not present

## 2019-11-30 DIAGNOSIS — Z6830 Body mass index (BMI) 30.0-30.9, adult: Secondary | ICD-10-CM | POA: Diagnosis not present

## 2019-11-30 DIAGNOSIS — I700 Atherosclerosis of aorta: Secondary | ICD-10-CM | POA: Diagnosis not present

## 2019-12-05 DIAGNOSIS — Z87898 Personal history of other specified conditions: Secondary | ICD-10-CM | POA: Diagnosis not present

## 2019-12-05 DIAGNOSIS — H61303 Acquired stenosis of external ear canal, unspecified, bilateral: Secondary | ICD-10-CM | POA: Diagnosis not present

## 2019-12-05 DIAGNOSIS — H9193 Unspecified hearing loss, bilateral: Secondary | ICD-10-CM | POA: Diagnosis not present

## 2019-12-05 DIAGNOSIS — R42 Dizziness and giddiness: Secondary | ICD-10-CM | POA: Diagnosis not present

## 2019-12-05 DIAGNOSIS — H6121 Impacted cerumen, right ear: Secondary | ICD-10-CM | POA: Diagnosis not present

## 2019-12-05 DIAGNOSIS — R519 Headache, unspecified: Secondary | ICD-10-CM | POA: Diagnosis not present

## 2019-12-05 DIAGNOSIS — J343 Hypertrophy of nasal turbinates: Secondary | ICD-10-CM | POA: Diagnosis not present

## 2019-12-05 DIAGNOSIS — G8929 Other chronic pain: Secondary | ICD-10-CM | POA: Diagnosis not present

## 2019-12-07 DIAGNOSIS — I10 Essential (primary) hypertension: Secondary | ICD-10-CM | POA: Diagnosis not present

## 2019-12-07 DIAGNOSIS — K219 Gastro-esophageal reflux disease without esophagitis: Secondary | ICD-10-CM | POA: Diagnosis not present

## 2019-12-14 DIAGNOSIS — R0600 Dyspnea, unspecified: Secondary | ICD-10-CM | POA: Diagnosis not present

## 2019-12-14 DIAGNOSIS — I517 Cardiomegaly: Secondary | ICD-10-CM | POA: Diagnosis not present

## 2019-12-14 DIAGNOSIS — Z6830 Body mass index (BMI) 30.0-30.9, adult: Secondary | ICD-10-CM | POA: Diagnosis not present

## 2019-12-14 DIAGNOSIS — R609 Edema, unspecified: Secondary | ICD-10-CM | POA: Diagnosis not present

## 2019-12-14 DIAGNOSIS — Z79899 Other long term (current) drug therapy: Secondary | ICD-10-CM | POA: Diagnosis not present

## 2019-12-20 DIAGNOSIS — M542 Cervicalgia: Secondary | ICD-10-CM | POA: Diagnosis not present

## 2019-12-20 DIAGNOSIS — M545 Low back pain: Secondary | ICD-10-CM | POA: Diagnosis not present

## 2020-01-06 DIAGNOSIS — E782 Mixed hyperlipidemia: Secondary | ICD-10-CM | POA: Diagnosis not present

## 2020-01-06 DIAGNOSIS — K219 Gastro-esophageal reflux disease without esophagitis: Secondary | ICD-10-CM | POA: Diagnosis not present

## 2020-01-06 DIAGNOSIS — I10 Essential (primary) hypertension: Secondary | ICD-10-CM | POA: Diagnosis not present

## 2020-01-24 DIAGNOSIS — G8929 Other chronic pain: Secondary | ICD-10-CM | POA: Diagnosis not present

## 2020-01-24 DIAGNOSIS — Z6829 Body mass index (BMI) 29.0-29.9, adult: Secondary | ICD-10-CM | POA: Diagnosis not present

## 2020-01-24 DIAGNOSIS — R109 Unspecified abdominal pain: Secondary | ICD-10-CM | POA: Diagnosis not present

## 2020-02-06 DIAGNOSIS — K219 Gastro-esophageal reflux disease without esophagitis: Secondary | ICD-10-CM | POA: Diagnosis not present

## 2020-02-06 DIAGNOSIS — I10 Essential (primary) hypertension: Secondary | ICD-10-CM | POA: Diagnosis not present

## 2020-02-06 DIAGNOSIS — E782 Mixed hyperlipidemia: Secondary | ICD-10-CM | POA: Diagnosis not present

## 2020-02-18 ENCOUNTER — Other Ambulatory Visit (HOSPITAL_COMMUNITY): Payer: PPO

## 2020-02-20 ENCOUNTER — Other Ambulatory Visit: Payer: Self-pay

## 2020-02-21 ENCOUNTER — Other Ambulatory Visit: Payer: Self-pay

## 2020-02-21 ENCOUNTER — Ambulatory Visit: Payer: PPO | Admitting: Adult Health

## 2020-02-21 ENCOUNTER — Ambulatory Visit: Payer: PPO | Admitting: Cardiology

## 2020-02-21 ENCOUNTER — Encounter: Payer: Self-pay | Admitting: Cardiology

## 2020-02-21 VITALS — BP 130/70 | HR 63 | Ht 63.0 in | Wt 161.0 lb

## 2020-02-21 DIAGNOSIS — E785 Hyperlipidemia, unspecified: Secondary | ICD-10-CM | POA: Diagnosis not present

## 2020-02-21 DIAGNOSIS — I10 Essential (primary) hypertension: Secondary | ICD-10-CM

## 2020-02-21 DIAGNOSIS — I2510 Atherosclerotic heart disease of native coronary artery without angina pectoris: Secondary | ICD-10-CM

## 2020-02-21 NOTE — Patient Instructions (Signed)
Medication Instructions:  No medication changes *If you need a refill on your cardiac medications before your next appointment, please call your pharmacy*   Lab Work: Your physician recommends that you have a BMET today in the office.  If you have labs (blood work) drawn today and your tests are completely normal, you will receive your results only by: . MyChart Message (if you have MyChart) OR . A paper copy in the mail If you have any lab test that is abnormal or we need to change your treatment, we will call you to review the results.   Testing/Procedures: None ordered   Follow-Up: At [facility], you and your health needs are our priority.  As part of our continuing mission to provide you with exceptional heart care, we have created designated Provider Care Teams.  These Care Teams include your primary Cardiologist (physician) and Advanced Practice Providers (APPs -  Physician Assistants and Nurse Practitioners) who all work together to provide you with the care you need, when you need it.  We recommend signing up for the patient portal called "MyChart".  Sign up information is provided on this After Visit Summary.  MyChart is used to connect with patients for Virtual Visits (Telemedicine).  Patients are able to view lab/test results, encounter notes, upcoming appointments, etc.  Non-urgent messages can be sent to your provider as well.   To learn more about what you can do with MyChart, go to [email or web address].    Your next appointment:   6 month(s)  The format for your next appointment:   In Person  Provider:   Rajan Revankar, MD   Other Instructions NA  

## 2020-02-21 NOTE — Progress Notes (Signed)
Cardiology Office Note:    Date:  02/21/2020   ID:  Sheila [name], DOB Aug 02, 1941, MRN [id number]  PCP:  Mateo Flow, MD  Cardiologist:  Jenean Lindau, MD   Referring MD: Mateo Flow, MD    ASSESSMENT:    1. Coronary artery disease involving native coronary artery of native heart without angina pectoris   2. Essential hypertension   3. Dyslipidemia    PLAN:    In order of problems listed above:  1. Coronary artery disease: Secondary prevention stressed with the patient.  Importance of compliance with diet medication stressed and she vocalized understanding. 2. Essential hypertension: Blood pressure stable diet was emphasized.  I told her to walk at least half an hour a day on a regular basis.   3. Mixed dyslipidemia: She mentions to me that her primary care physician recently checked her lipids and they were fine.  Diet was emphasized weight reduction was stressed.  She promises to do better.Patient will be seen in follow-up appointment in 6 months or earlier if the patient has any concerns    Medication Adjustments/Labs and Tests Ordered: Current medicines are reviewed at length with the patient today.  Concerns regarding medicines are outlined above.  No orders of the defined types were placed in this encounter.  No orders of the defined types were placed in this encounter.    Chief Complaint  Patient presents with  . Follow-up     History of Present Illness:    Sheila [name] is a 79 y.o. female.  Patient has past medical history of coronary artery disease, essential hypertension and dyslipidemia.  She denies any problems at this time and takes care of activities of daily living.  No chest pain orthopnea or PND.  She is an active lady.  She has some issues with gait but overall she takes care of herself well.  She also provides care to an elderly lady.  At the time of my evaluation, the patient is alert awake oriented and in no distress.  Past Medical History:    Diagnosis Date  . Allergy   . Anxiety   . Arthritis   . Asthma   . Blood transfusion without reported diagnosis   . Cataract   . Chronic headaches   . Depression   . GERD (gastroesophageal reflux disease)   . Glaucoma   . Heart murmur   . Hyperlipidemia   . Hypertension     Past Surgical History:  Procedure Laterality Date  . CESAREAN SECTION    . COLONOSCOPY  05/16/2015   Moderate sigmoid diverticulosis.   Marland Kitchen EYE SURGERY    . LEFT HEART CATH AND CORONARY ANGIOGRAPHY N/A 12/28/2017   Procedure: LEFT HEART CATH AND CORONARY ANGIOGRAPHY;  Surgeon: Belva Crome, MD;  Location: [facility];  Service: Cardiovascular;  Laterality: N/A;  . MASTOIDECTOMY    . PATENT DUCTUS ARTERIOUS REPAIR    . SHOULDER OPEN ROTATOR CUFF REPAIR Bilateral     Current Medications: Current Meds  Medication Sig  . ALPRAZolam (XANAX) 0.25 MG tablet Take 0.25 mg by mouth 2 (two) times daily as needed for anxiety.  Marland Kitchen amLODipine (NORVASC) 2.5 MG tablet Take 1 tablet by mouth daily.  Marland Kitchen amLODipine (NORVASC) 5 MG tablet Take 1 tablet (5 mg total) by mouth daily. (Patient taking differently: Take 2.5 mg by mouth daily. )  . aspirin EC 81 MG tablet Take 81 mg by mouth daily.  Marland Kitchen azelastine (ASTELIN) 0.1 %  nasal spray Place 2 sprays into both nostrils 2 (two) times daily.  . brimonidine (ALPHAGAN) 0.15 % ophthalmic solution Place 1 drop into both eyes every 12 (twelve) hours.   . carvedilol (COREG) 6.25 MG tablet Take 6.25 mg by mouth 2 (two) times daily with a meal.  . Coenzyme Q10 (CO Q-10) 200 MG CAPS Take 1 capsule by mouth daily.   . dorzolamide (TRUSOPT) 2 % ophthalmic solution Place 1 drop into both eyes daily.   . famotidine (PEPCID) 20 MG tablet Take 20 mg by mouth 2 (two) times daily.  . famotidine (PEPCID) 40 MG tablet Take 1 tablet by mouth daily.  . fluticasone (FLONASE) 50 MCG/ACT nasal spray Place 2 sprays into both nostrils 2 (two) times daily as needed.  . furosemide (LASIX) 40 MG  tablet Take 40 mg by mouth daily as needed.  . isosorbide mononitrate (IMDUR) 30 MG 24 hr tablet Take 1 tablet (30 mg total) by mouth daily.  Marland Kitchen latanoprost (XALATAN) 0.005 % ophthalmic solution 1 drop at bedtime.  Marland Kitchen levocetirizine (XYZAL) 5 MG tablet Take 5 mg by mouth daily.  Marland Kitchen LINZESS 72 MCG capsule Take 72 mcg by mouth as needed.  . meclizine (ANTIVERT) 12.5 MG tablet Take 12.5 mg by mouth 4 (four) times daily as needed for dizziness. Was given 25mg  tablet to break in half  . metoCLOPramide (REGLAN) 10 MG tablet Take 1 tablet (10 mg total) by mouth 3 (three) times daily before meals for 14 days.  . mometasone (NASONEX) 50 MCG/ACT nasal spray 1 spray 2 (two) times daily.  . nitroGLYCERIN (NITROSTAT) 0.4 MG SL tablet Place 1 tablet (0.4 mg total) under the tongue every 5 (five) minutes as needed for chest pain.  . pantoprazole (PROTONIX) 40 MG tablet Take 1 tablet (40 mg total) by mouth 2 (two) times daily.  . potassium chloride (MICRO-K) 10 MEQ CR capsule Take 1 capsule by mouth daily.  . prednisoLONE acetate (PRED FORTE) 1 % ophthalmic suspension Place 1 drop into the right eye 2 (two) times daily.  Marland Kitchen PROAIR HFA 108 (90 Base) MCG/ACT inhaler Inhale 1 puff into the lungs as needed. Wheezing and shortness of breath  . rosuvastatin (CRESTOR) 10 MG tablet Take 10 mg by mouth as directed. Take it every Monday, Wednesday, Friday and Saturday  . sodium chloride (MURO 128) 2 % ophthalmic solution Place 1 drop into the right eye 2 (two) times daily.  . sucralfate (CARAFATE) 1 g tablet Take 1 g by mouth 3 (three) times daily.   . timolol (BETIMOL) 0.5 % ophthalmic solution Place 1 drop into both eyes 2 (two) times daily.   . timolol (TIMOPTIC) 0.5 % ophthalmic solution 1 drop 2 (two) times daily. in both eyes.  Marland Kitchen tiZANidine (ZANAFLEX) 2 MG tablet Take 2 mg by mouth at bedtime.  . traMADol-acetaminophen (ULTRACET) 37.5-325 MG tablet Take 1-2 tablets by mouth as needed.  . Wheat Dextrin (BENEFIBER PO)  Take by mouth 2 (two) times daily.   Current Facility-Administered Medications for the 02/21/20 encounter (Office Visit) with Prudence Heiny, Reita Cliche, MD  Medication  . 0.9 %  sodium chloride infusion     Allergies:   Antihistamines, chlorpheniramine-type; Ativan [lorazepam]; Ceftin [cefuroxime axetil]; Meloxicam; Pheneen [benzalkonium chloride]; and Morphine   Social History   Socioeconomic History  . Marital status: Divorced    Spouse name: Not on file  . Number of children: 5  . Years of education: Not on file  . Highest education level: Not on file  Occupational History  . Occupation: Retired   Tobacco Use  . Smoking status: Never Smoker  . Smokeless tobacco: Never Used  Vaping Use  . Vaping Use: Never used  Substance and Sexual Activity  . Alcohol use: No  . Drug use: Never  . Sexual activity: Not on file  Other Topics Concern  . Not on file  Social History Narrative  . Not on file   Social Determinants of Health   Financial Resource Strain:   . Difficulty of Paying Living Expenses:   Food Insecurity:   . Worried About Charity fundraiser in the Last Year:   . Arboriculturist in the Last Year:   Transportation Needs:   . Film/video editor (Medical):   Marland Kitchen Lack of Transportation (Non-Medical):   Physical Activity:   . Days of Exercise per Week:   . Minutes of Exercise per Session:   Stress:   . Feeling of Stress :   Social Connections:   . Frequency of Communication with Friends and Family:   . Frequency of Social Gatherings with Friends and Family:   . Attends Religious Services:   . Active Member of Clubs or Organizations:   . Attends Archivist Meetings:   Marland Kitchen Marital Status:      Family History: The patient's family history includes Colon cancer in her brother; Diabetes in her brother and daughter; Heart attack in her mother and sister; Stomach cancer in her sister. There is no history of Esophageal cancer.  ROS:   Please see the history of  present illness.    All other systems reviewed and are negative.  EKGs/Labs/Other Studies Reviewed:    The following studies were reviewed today: I discussed my findings with the patient at extensive length.  EKG done today reveals sinus rhythm and nonspecific ST-T changes and global T wave inversions.   Recent Labs: No results found for requested labs within last 8760 hours.  Recent Lipid Panel No results found for: CHOL, TRIG, HDL, CHOLHDL, VLDL, LDLCALC, LDLDIRECT  Physical Exam:    VS:  BP 130/70   Pulse 63   Ht 5\' 3"  (1.6 m)   Wt 161 lb (73 kg)   SpO2 96%   BMI 28.52 kg/m     Wt Readings from Last 3 Encounters:  02/21/20 161 lb (73 kg)  11/21/19 166 lb 12.8 oz (75.7 kg)  10/18/19 158 lb (71.7 kg)     GEN: Patient is in no acute distress HEENT: Normal NECK: No JVD; No carotid bruits LYMPHATICS: No lymphadenopathy CARDIAC: Hear sounds regular, 2/6 systolic murmur at the apex. RESPIRATORY:  Clear to auscultation without rales, wheezing or rhonchi  ABDOMEN: Soft, non-tender, non-distended MUSCULOSKELETAL:  No edema; No deformity  SKIN: Warm and dry NEUROLOGIC:  Alert and oriented x 3 PSYCHIATRIC:  Normal affect   Signed, Jenean Lindau, MD  02/21/2020 4:06 PM    [facility]

## 2020-02-22 ENCOUNTER — Telehealth: Payer: Self-pay

## 2020-02-22 LAB — BASIC METABOLIC PANEL
BUN/Creatinine Ratio: 14 (ref 12–28)
BUN: 11 mg/dL (ref 8–27)
CO2: 22 mmol/L (ref 20–29)
Calcium: 9.5 mg/dL (ref 8.7–10.3)
Chloride: 102 mmol/L (ref 96–106)
Creatinine, Ser: 0.77 mg/dL (ref 0.57–1.00)
GFR calc Af Amer: 86 mL/min/{1.73_m2} (ref >59)
GFR calc non Af Amer: 74 mL/min/{1.73_m2} (ref >59)
Glucose: 84 mg/dL (ref 65–99)
Potassium: 4.2 mmol/L (ref 3.5–5.2)
Sodium: 141 mmol/L (ref 134–144)

## 2020-02-22 NOTE — Telephone Encounter (Signed)
-----   Message from Jenean Lindau, MD sent at 02/22/2020  8:22 AM EDT ----- The results of the study is unremarkable. Please inform patient. I will discuss in detail at next appointment. Cc  primary care/referring physician Jenean Lindau, MD 02/22/2020 8:22 AM

## 2020-02-22 NOTE — Telephone Encounter (Signed)
Tried calling patient. No answer and no voicemail set up for me to leave a message. 

## 2020-02-24 DIAGNOSIS — H402212 Chronic angle-closure glaucoma, right eye, moderate stage: Secondary | ICD-10-CM | POA: Diagnosis not present

## 2020-03-07 DIAGNOSIS — K219 Gastro-esophageal reflux disease without esophagitis: Secondary | ICD-10-CM | POA: Diagnosis not present

## 2020-03-07 DIAGNOSIS — E782 Mixed hyperlipidemia: Secondary | ICD-10-CM | POA: Diagnosis not present

## 2020-03-09 DIAGNOSIS — H402212 Chronic angle-closure glaucoma, right eye, moderate stage: Secondary | ICD-10-CM | POA: Diagnosis not present

## 2020-03-09 DIAGNOSIS — H402222 Chronic angle-closure glaucoma, left eye, moderate stage: Secondary | ICD-10-CM | POA: Diagnosis not present

## 2020-03-09 DIAGNOSIS — H1013 Acute atopic conjunctivitis, bilateral: Secondary | ICD-10-CM | POA: Diagnosis not present

## 2020-03-09 DIAGNOSIS — H1820 Unspecified corneal edema: Secondary | ICD-10-CM | POA: Diagnosis not present

## 2020-03-29 DIAGNOSIS — H402222 Chronic angle-closure glaucoma, left eye, moderate stage: Secondary | ICD-10-CM | POA: Diagnosis not present

## 2020-03-29 DIAGNOSIS — H1820 Unspecified corneal edema: Secondary | ICD-10-CM | POA: Diagnosis not present

## 2020-03-29 DIAGNOSIS — H5711 Ocular pain, right eye: Secondary | ICD-10-CM | POA: Diagnosis not present

## 2020-03-29 DIAGNOSIS — H402212 Chronic angle-closure glaucoma, right eye, moderate stage: Secondary | ICD-10-CM | POA: Diagnosis not present

## 2020-04-03 ENCOUNTER — Other Ambulatory Visit: Payer: Self-pay

## 2020-04-07 DIAGNOSIS — K219 Gastro-esophageal reflux disease without esophagitis: Secondary | ICD-10-CM | POA: Diagnosis not present

## 2020-04-07 DIAGNOSIS — E782 Mixed hyperlipidemia: Secondary | ICD-10-CM | POA: Diagnosis not present

## 2020-04-11 DIAGNOSIS — Z6829 Body mass index (BMI) 29.0-29.9, adult: Secondary | ICD-10-CM | POA: Diagnosis not present

## 2020-04-11 DIAGNOSIS — H93A9 Pulsatile tinnitus, unspecified ear: Secondary | ICD-10-CM | POA: Diagnosis not present

## 2020-04-11 DIAGNOSIS — R519 Headache, unspecified: Secondary | ICD-10-CM | POA: Diagnosis not present

## 2020-04-11 DIAGNOSIS — G8929 Other chronic pain: Secondary | ICD-10-CM | POA: Diagnosis not present

## 2020-04-11 DIAGNOSIS — I83812 Varicose veins of left lower extremities with pain: Secondary | ICD-10-CM | POA: Diagnosis not present

## 2020-04-17 ENCOUNTER — Other Ambulatory Visit: Payer: Self-pay | Admitting: Family Medicine

## 2020-04-17 DIAGNOSIS — H93A9 Pulsatile tinnitus, unspecified ear: Secondary | ICD-10-CM

## 2020-04-23 DIAGNOSIS — Y708 Miscellaneous anesthesiology devices associated with adverse incidents, not elsewhere classified: Secondary | ICD-10-CM | POA: Diagnosis not present

## 2020-04-23 DIAGNOSIS — I517 Cardiomegaly: Secondary | ICD-10-CM | POA: Diagnosis not present

## 2020-04-23 DIAGNOSIS — J310 Chronic rhinitis: Secondary | ICD-10-CM | POA: Diagnosis not present

## 2020-04-23 DIAGNOSIS — Z6825 Body mass index (BMI) 25.0-25.9, adult: Secondary | ICD-10-CM | POA: Diagnosis not present

## 2020-04-23 DIAGNOSIS — R0600 Dyspnea, unspecified: Secondary | ICD-10-CM | POA: Diagnosis not present

## 2020-04-23 DIAGNOSIS — I10 Essential (primary) hypertension: Secondary | ICD-10-CM | POA: Diagnosis not present

## 2020-04-23 DIAGNOSIS — K222 Esophageal obstruction: Secondary | ICD-10-CM | POA: Diagnosis not present

## 2020-04-23 DIAGNOSIS — R0789 Other chest pain: Secondary | ICD-10-CM | POA: Diagnosis not present

## 2020-04-23 DIAGNOSIS — K219 Gastro-esophageal reflux disease without esophagitis: Secondary | ICD-10-CM | POA: Diagnosis not present

## 2020-04-23 DIAGNOSIS — M159 Polyosteoarthritis, unspecified: Secondary | ICD-10-CM | POA: Diagnosis not present

## 2020-04-23 DIAGNOSIS — R609 Edema, unspecified: Secondary | ICD-10-CM | POA: Diagnosis not present

## 2020-05-02 ENCOUNTER — Other Ambulatory Visit: Payer: Self-pay | Admitting: Cardiology

## 2020-05-02 MED ORDER — NITROGLYCERIN 0.4 MG SL SUBL: 0.4000 mg | SUBLINGUAL_TABLET | SUBLINGUAL | 11 refills | Status: DC | PRN

## 2020-05-02 MED ORDER — ISOSORBIDE MONONITRATE ER 30 MG PO TB24: 30.0000 mg | ORAL_TABLET | Freq: Every day | ORAL | 1 refills | Status: DC

## 2020-05-02 NOTE — Telephone Encounter (Signed)
Refill sent in per request.  

## 2020-05-02 NOTE — Telephone Encounter (Signed)
    *  STAT* If patient is at the pharmacy, call can be transferred to refill team.   1. Which medications need to be refilled? (please list name of each medication and dose if known)   isosorbide mononitrate (IMDUR) 30 MG 24 hr tablet    nitroGLYCERIN (NITROSTAT) 0.4 MG SL tablet     2. Which pharmacy/location (including street and city if local pharmacy) is medication to be sent to? [facility] - [place], [place] - [place] [address]  3. Do they need a 30 day or 90 day supply? Nitroglycerine 1 bottle, isosorbide 90 days

## 2020-05-04 DIAGNOSIS — H402222 Chronic angle-closure glaucoma, left eye, moderate stage: Secondary | ICD-10-CM | POA: Diagnosis not present

## 2020-05-08 DIAGNOSIS — K219 Gastro-esophageal reflux disease without esophagitis: Secondary | ICD-10-CM | POA: Diagnosis not present

## 2020-05-08 DIAGNOSIS — E782 Mixed hyperlipidemia: Secondary | ICD-10-CM | POA: Diagnosis not present

## 2020-05-11 ENCOUNTER — Ambulatory Visit
Admission: RE | Admit: 2020-05-11 | Discharge: 2020-05-11 | Disposition: A | Discharge status: Home / Self Care | Payer: PPO | Source: Ambulatory Visit | Attending: Family Medicine | Admitting: Family Medicine

## 2020-05-11 ENCOUNTER — Other Ambulatory Visit: Payer: Self-pay

## 2020-05-11 DIAGNOSIS — R42 Dizziness and giddiness: Secondary | ICD-10-CM | POA: Diagnosis not present

## 2020-05-11 DIAGNOSIS — R200 Anesthesia of skin: Secondary | ICD-10-CM | POA: Diagnosis not present

## 2020-05-11 DIAGNOSIS — H93A9 Pulsatile tinnitus, unspecified ear: Secondary | ICD-10-CM

## 2020-05-11 DIAGNOSIS — R202 Paresthesia of skin: Secondary | ICD-10-CM | POA: Diagnosis not present

## 2020-05-11 MED ORDER — GADOBENATE DIMEGLUMINE 529 MG/ML IV SOLN
15.0000 mL | Freq: Once | INTRAVENOUS | Status: AC | PRN
  Administered 2020-05-11: 15 mL via INTRAVENOUS

## 2020-05-18 ENCOUNTER — Other Ambulatory Visit: Payer: Self-pay

## 2020-05-18 ENCOUNTER — Ambulatory Visit: Payer: PPO | Admitting: Adult Health

## 2020-05-18 ENCOUNTER — Ambulatory Visit (INDEPENDENT_AMBULATORY_CARE_PROVIDER_SITE_OTHER): Payer: PPO | Admitting: Emergency Medicine

## 2020-05-18 ENCOUNTER — Encounter: Payer: Self-pay | Admitting: Primary Care

## 2020-05-18 ENCOUNTER — Ambulatory Visit (INDEPENDENT_AMBULATORY_CARE_PROVIDER_SITE_OTHER): Payer: PPO | Admitting: Primary Care

## 2020-05-18 DIAGNOSIS — J449 Chronic obstructive pulmonary disease, unspecified: Secondary | ICD-10-CM | POA: Diagnosis not present

## 2020-05-18 DIAGNOSIS — J4489 Other specified chronic obstructive pulmonary disease: Secondary | ICD-10-CM

## 2020-05-18 DIAGNOSIS — R0602 Shortness of breath: Secondary | ICD-10-CM

## 2020-05-18 HISTORY — DX: Chronic obstructive pulmonary disease, unspecified: J44.9

## 2020-05-18 HISTORY — DX: Other specified chronic obstructive pulmonary disease: J44.89

## 2020-05-18 LAB — PULMONARY FUNCTION TEST
DL/VA % pred: 126 %
DL/VA: 5.21 ml/min/mmHg/L
DLCO cor % pred: 84 %
DLCO cor: 15.4 ml/min/mmHg
DLCO unc % pred: 84 %
DLCO unc: 15.4 ml/min/mmHg
FEF 25-75 Post: 0.79 L/sec
FEF 25-75 Pre: 0.61 L/sec
FEF2575-%Change-Post: 28 %
FEF2575-%Pred-Post: 59 %
FEF2575-%Pred-Pre: 46 %
FEV1-%Change-Post: 6 %
FEV1-%Pred-Post: 66 %
FEV1-%Pred-Pre: 62 %
FEV1-Post: 1.02 L
FEV1-Pre: 0.96 L
FEV1FVC-%Change-Post: -6 %
FEV1FVC-%Pred-Pre: 93 %
FEV6-%Change-Post: 13 %
FEV6-%Pred-Post: 80 %
FEV6-%Pred-Pre: 71 %
FEV6-Post: 1.53 L
FEV6-Pre: 1.35 L
FEV6FVC-%Pred-Post: 104 %
FEV6FVC-%Pred-Pre: 104 %
FVC-%Change-Post: 13 %
FVC-%Pred-Post: 77 %
FVC-%Pred-Pre: 68 %
FVC-Post: 1.53 L
FVC-Pre: 1.35 L
Post FEV1/FVC ratio: 67 %
Post FEV6/FVC ratio: 100 %
Pre FEV1/FVC ratio: 71 %
Pre FEV6/FVC Ratio: 100 %
RV % pred: 144 %
RV: 3.33 L
TLC % pred: 103 %
TLC: 5.09 L

## 2020-05-18 MED ORDER — ARMONAIR DIGIHALER 113 MCG/ACT IN AEPB: 1.0000 | INHALATION_SPRAY | Freq: Two times a day (BID) | RESPIRATORY_TRACT | 0 refills | Status: DC

## 2020-05-18 NOTE — Assessment & Plan Note (Addendum)
-   Patient has been experiencing mild dyspnea on exertion with associated chest tightness. Rare SABA use.  - PFTs 05/18/2020 FEV1 1.02 (66%), ratio 67/ Mild obstruction with positive bronchodilator response  - Trial ArmonAir Respiclick (Fluticasone) 1 puff twice daily  - Continue Albuterol 2 puffs every 4-6 hours prn shortness of breath/wheezing - FU televisit 2 weeks with APP

## 2020-05-18 NOTE — Progress Notes (Signed)
Full PFT performed today. °

## 2020-05-18 NOTE — Patient Instructions (Signed)
Testing: - Pulmonary function testing showed moderate obstructive lung diease with positive bronchodilator response. 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 We will try you on a steriod inhaler called fluticasone. You will take 1 puff twice daily scheduled.   Recommendations: - Sample Fluticasone inhaler- take 1 puffs every 12 hours (rinse mouth after use) - Continue Albuterol rescue inhaler- 2 puffs every 4-6 hours as needed only for breakthrough shortness of breath or wheezing  Follow-up: -Televisit in 2 weeks with Sheila [name] to see how you response to new inhaler    Asthma, Adult  Asthma is a long-term (chronic) condition in which the airways get tight and narrow. The airways are the breathing passages that lead from the nose and mouth down into the lungs. A person with asthma will have times when symptoms get worse. These are called asthma attacks. They can cause coughing, whistling sounds when you breathe (wheezing), shortness of breath, and chest pain. They can make it hard to breathe. There is no cure for asthma, but medicines and lifestyle changes can help control it. There are many things that can bring on an asthma attack or make asthma symptoms worse (triggers). Common triggers include:  Mold.  Dust.  Cigarette smoke.  Cockroaches.  Things that can cause allergy symptoms (allergens). These include animal skin flakes (dander) and pollen from trees or grass.  Things that pollute the air. These may include household cleaners, wood smoke, smog, or chemical odors.  Cold air, weather changes, and wind.  Crying or laughing hard.  Stress.  Certain medicines or drugs.  Certain foods such as dried fruit, potato chips, and grape juice.  Infections, such as a cold or the flu.  Certain medical conditions or diseases.  Exercise or tiring activities. Asthma may be treated with medicines and by staying away from the things that cause asthma attacks. Types of medicines  may include:  Controller medicines. These help prevent asthma symptoms. They are usually taken every day.  Fast-acting reliever or rescue medicines. These quickly relieve asthma symptoms. They are used as needed and provide short-term relief.  Allergy medicines if your attacks are brought on by allergens.  Medicines to help control the body's defense (immune) system. Follow these instructions at home: Avoiding triggers in your home  Change your heating and air conditioning filter often.  Limit your use of fireplaces and wood stoves.  Get rid of pests (such as roaches and mice) and their droppings.  Throw away plants if you see mold on them.  Clean your floors. Dust regularly. Use cleaning products that do not smell.  Have someone vacuum when you are not home. Use a vacuum cleaner with a HEPA filter if possible.  Replace carpet with wood, tile, or vinyl flooring. Carpet can trap animal skin flakes and dust.  Use allergy-proof pillows, mattress covers, and box spring covers.  Wash bed sheets and blankets every week in hot water. Dry them in a dryer.  Keep your bedroom free of any triggers.  Avoid pets and keep windows closed when things that cause allergy symptoms are in the air.  Use blankets that are made of polyester or cotton.  Clean bathrooms and kitchens with bleach. If possible, have someone repaint the walls in these rooms with mold-resistant paint. Keep out of the rooms that are being cleaned and painted.  Wash your hands often with soap and water. If soap and water are not available, use hand sanitizer.  Do not allow anyone to smoke in your home. General  instructions  Take over-the-counter and prescription medicines only as told by your doctor. ? Talk with your doctor if you have questions about how or when to take your medicines. ? Make note if you need to use your medicines more often than usual.  Do not use any products that contain nicotine or tobacco, such  as cigarettes and e-cigarettes. If you need help quitting, ask your doctor.  Stay away from secondhand smoke.  Avoid doing things outdoors when allergen counts are high and when air quality is low.  Wear a ski mask when doing outdoor activities in the winter. The mask should cover your nose and mouth. Exercise indoors on cold days if you can.  Warm up before you exercise. Take time to cool down after exercise.  Use a peak flow meter as told by your doctor. A peak flow meter is a tool that measures how well the lungs are working.  Keep track of the peak flow meter's readings. Write them down.  Follow your asthma action plan. This is a written plan for taking care of your asthma and treating your attacks.  Make sure you get all the shots (vaccines) that your doctor recommends. Ask your doctor about a flu shot and a pneumonia shot.  Keep all follow-up visits as told by your doctor. This is important. Contact a doctor if:  You have wheezing, shortness of breath, or a cough even while taking medicine to prevent attacks.  The mucus you cough up (sputum) is thicker than usual.  The mucus you cough up changes from clear or white to yellow, green, gray, or bloody.  You have problems from the medicine you are taking, such as: ? A rash. ? Itching. ? Swelling. ? Trouble breathing.  You need reliever medicines more than 2-3 times a week.  Your peak flow reading is still at 50-79% of your personal best after following the action plan for 1 hour.  You have a fever. Get help right away if:  You seem to be worse and are not responding to medicine during an asthma attack.  You are short of breath even at rest.  You get short of breath when doing very little activity.  You have trouble eating, drinking, or talking.  You have chest pain or tightness.  You have a fast heartbeat.  Your lips or fingernails start to turn blue.  You are light-headed or dizzy, or you faint.  Your peak  flow is less than 50% of your personal best.  You feel too tired to breathe normally. Summary  Asthma is a long-term (chronic) condition in which the airways get tight and narrow. An asthma attack can make it hard to breathe.  Asthma cannot be cured, but medicines and lifestyle changes can help control it.  Make sure you understand how to avoid triggers and how and when to use your medicines. This information is not intended to replace advice given to you by your health care provider. Make sure you discuss any questions you have with your health care provider. Document Revised: 10/28/2018 Document Reviewed: 09/29/2016 Elsevier Patient Education  2020 [employer].

## 2020-05-18 NOTE — Progress Notes (Signed)
@Patient  ID: Sheila [name], female    DOB: [birth year], 79 y.o.   MRN: [id number]  Chief Complaint  Patient presents with  . Follow-up    PFT performed today. Pt states she has had some pain in her back which is worse today after doing the PFT.     Referring provider: Mateo Flow, MD  HPI: 79 year old female, never smoked.  Past medical history significant for coronary artery disease, hypertension, microvascular angina, GERD, dyslipidemia.  Patient of Dr. Lamonte Sakai, seen for initial consult on 11/21/2019 for shortness of breath.  Dyspnea dates back for several years seemingly progressive.  Potential component of restriction due to abdominal difficulties which include dysphagia, poor gastric emptying and recent schatzki ring.  Recent echocardiogram showed left ventricular hypertrophy.  She had asthma as a child/young adult, she has had no symptoms for decades.  She is at risk for fixed obstructive lung disease.  Patient ordered for chest x-ray, pulmonary function testing.  05/18/2020- Interim hx Patient presents today for 42-month follow-up visit with PFT.  Accompanied by daughter. She is doing well. No acute complaints. Continues to have dyspnea with exertion and associated chest tightness. Occasional wheezing. No significant cough. She does not use Proair rescue inhaler that often. Chest x-ray in March showed no evidence of acute cardiopulmonary disease.  Lung volumes were normal.  No pulmonary nodules or masses. Atherosclerosis in the thoracic aorta. Pulmonary function testing today showed mild obstruction with positive BD response.   Pulmonary function testing 05/18/2020 - FVC 1.53 (77%), FEV1 1.02 (66%), ratio 67, DLCOcor 18.28 (84%) Interpretation: Mild obstruction with positive bronchodilator response   Allergies  Allergen Reactions  . Antihistamines, Chlorpheniramine-Type Anaphylaxis  . Ativan [Lorazepam] Swelling  . Ceftin [Cefuroxime Axetil]   . Meloxicam   . Pheneen [Benzalkonium  Chloride]   . Morphine Palpitations    Immunization History  Administered Date(s) Administered  . Influenza, High Dose Seasonal PF 06/09/2019  . Influenza-Unspecified 06/08/2013  . Moderna SARS-COVID-2 Vaccination 10/04/2019, 11/02/2019    Past Medical History:  Diagnosis Date  . Allergy   . Anxiety   . Arthritis   . Asthma   . Blood transfusion without reported diagnosis   . Cataract   . Chronic headaches   . Depression   . GERD (gastroesophageal reflux disease)   . Glaucoma   . Heart murmur   . Hyperlipidemia   . Hypertension     Tobacco History: Social History   Tobacco Use  Smoking Status Never Smoker  Smokeless Tobacco Never Used   Counseling given: Not Answered   Outpatient Medications Prior to Visit  Medication Sig Dispense Refill  . ALPRAZolam (XANAX) 0.25 MG tablet Take 0.25 mg by mouth 2 (two) times daily as needed for anxiety.    Marland Kitchen amLODipine (NORVASC) 2.5 MG tablet Take 1 tablet by mouth daily.    Marland Kitchen aspirin EC 81 MG tablet Take 81 mg by mouth daily.    Marland Kitchen azelastine (ASTELIN) 0.1 % nasal spray Place 2 sprays into both nostrils 2 (two) times daily.    . carvedilol (COREG) 6.25 MG tablet Take 6.25 mg by mouth 2 (two) times daily with a meal.    . Coenzyme Q10 (CO Q-10) 200 MG CAPS Take 1 capsule by mouth daily.     . dorzolamide (TRUSOPT) 2 % ophthalmic solution Place 1 drop into both eyes daily.     . famotidine (PEPCID) 20 MG tablet Take 20 mg by mouth 2 (two) times daily.    Marland Kitchen  fluticasone (FLONASE) 50 MCG/ACT nasal spray Place 2 sprays into both nostrils 2 (two) times daily as needed.    . furosemide (LASIX) 40 MG tablet Take 40 mg by mouth daily as needed.  6  . isosorbide mononitrate (IMDUR) 30 MG 24 hr tablet Take 1 tablet (30 mg total) by mouth daily. 90 tablet 1  . latanoprost (XALATAN) 0.005 % ophthalmic solution 1 drop at bedtime.    Marland Kitchen levocetirizine (XYZAL) 5 MG tablet Take 5 mg by mouth daily.    Marland Kitchen LINZESS 72 MCG capsule Take 72 mcg by mouth  as needed.    . meclizine (ANTIVERT) 12.5 MG tablet Take 12.5 mg by mouth 4 (four) times daily as needed for dizziness. Was given 25mg  tablet to break in half    . mometasone (NASONEX) 50 MCG/ACT nasal spray 1 spray 2 (two) times daily.    . nitroGLYCERIN (NITROSTAT) 0.4 MG SL tablet Place 1 tablet (0.4 mg total) under the tongue every 5 (five) minutes as needed for chest pain. 25 tablet 11  . pantoprazole (PROTONIX) 40 MG tablet Take 1 tablet (40 mg total) by mouth 2 (two) times daily. 60 tablet 11  . potassium chloride (MICRO-K) 10 MEQ CR capsule Take 1 capsule by mouth daily.    Marland Kitchen PROAIR HFA 108 (90 Base) MCG/ACT inhaler Inhale 1 puff into the lungs as needed. Wheezing and shortness of breath    . rosuvastatin (CRESTOR) 10 MG tablet Take 10 mg by mouth as directed. Take it every Monday, Wednesday, Friday and Saturday    . sodium chloride (MURO 128) 2 % ophthalmic solution Place 1 drop into the right eye 2 (two) times daily.    . sucralfate (CARAFATE) 1 g tablet Take 1 g by mouth 3 (three) times daily.     . timolol (TIMOPTIC) 0.5 % ophthalmic solution 1 drop 2 (two) times daily. in both eyes.    Marland Kitchen tiZANidine (ZANAFLEX) 2 MG tablet Take 2 mg by mouth at bedtime.    . traMADol-acetaminophen (ULTRACET) 37.5-325 MG tablet Take 1-2 tablets by mouth as needed.    . Wheat Dextrin (BENEFIBER PO) Take by mouth 2 (two) times daily.    Marland Kitchen amLODipine (NORVASC) 5 MG tablet Take 1 tablet (5 mg total) by mouth daily. (Patient taking differently: Take 2.5 mg by mouth daily. ) 90 tablet 0  . metoCLOPramide (REGLAN) 10 MG tablet Take 1 tablet (10 mg total) by mouth 3 (three) times daily before meals for 14 days. 42 tablet 0  . brimonidine (ALPHAGAN) 0.15 % ophthalmic solution Place 1 drop into both eyes every 12 (twelve) hours.     . famotidine (PEPCID) 40 MG tablet Take 1 tablet by mouth daily.    . prednisoLONE acetate (PRED FORTE) 1 % ophthalmic suspension Place 1 drop into the right eye 2 (two) times daily.      . timolol (BETIMOL) 0.5 % ophthalmic solution Place 1 drop into both eyes 2 (two) times daily.      Facility-Administered Medications Prior to Visit  Medication Dose Route Frequency Provider Last Rate Last Admin  . 0.9 %  sodium chloride infusion  500 mL Intravenous Once Jackquline Denmark, MD        Review of Systems  Review of Systems  Constitutional: Negative.   Respiratory: Positive for chest tightness, shortness of breath and wheezing. Negative for cough.   Cardiovascular: Negative.    Physical Exam  BP 130/80 (BP Location: Left Arm, Cuff Size: Normal)   Pulse 78  Temp 97.8 F (36.6 C) (Other (Comment)) Comment (Src): wrist  Ht 5\' 3"  (1.6 m)   Wt 159 lb (72.1 kg)   SpO2 99%   BMI 28.17 kg/m  Physical Exam Constitutional:      Appearance: Normal appearance. She is not ill-appearing.     Comments: Well appearing  HENT:     Mouth/Throat:     Mouth: Mucous membranes are moist.     Pharynx: Oropharynx is clear.  Cardiovascular:     Rate and Rhythm: Normal rate and regular rhythm.     Comments: No edema Pulmonary:     Effort: Pulmonary effort is normal.     Breath sounds: Normal breath sounds. No wheezing or rhonchi.  Musculoskeletal:     Cervical back: Normal range of motion and neck supple.  Skin:    General: Skin is warm and dry.  Neurological:     Mental Status: She is alert.  Psychiatric:        Mood and Affect: Mood normal.        Behavior: Behavior normal.        Thought Content: Thought content normal.        Judgment: Judgment normal.      Lab Results:  CBC    Component Value Date/Time   HGB 13.3 12/28/2017 0743   HCT 39.0 12/28/2017 0743    BMET    Component Value Date/Time   NA 141 02/21/2020 1614   K 4.2 02/21/2020 1614   CL 102 02/21/2020 1614   CO2 22 02/21/2020 1614   GLUCOSE 84 02/21/2020 1614   GLUCOSE 130 (H) 11/09/2018 1037   BUN 11 02/21/2020 1614   CREATININE 0.77 02/21/2020 1614   CALCIUM 9.5 02/21/2020 1614   GFRNONAA 74  02/21/2020 1614   GFRAA 86 02/21/2020 1614    BNP No results found for: BNP  ProBNP No results found for: PROBNP  Imaging: MR BRAIN W WO CONTRAST  Result Date: 05/12/2020 CLINICAL DATA:  Lightheadedness and unsteady gait for a year or longer. Numbness and tingling in both arms and hands. Bilateral hearing loss. History of mastoidectomy and CSF leak. EXAM: MRI HEAD WITHOUT AND WITH CONTRAST TECHNIQUE: Multiplanar, multiecho pulse sequences of the brain and surrounding structures were obtained without and with intravenous contrast. CONTRAST:  48mL MULTIHANCE GADOBENATE DIMEGLUMINE 529 MG/ML IV SOLN COMPARISON:  05/28/2018 FINDINGS: Brain: Marked superficial siderosis in the bilateral posterior fossa, coating the brainstem and folia, particularly heavy at the supra vermian cistern. There is also symmetric involvement along the cerebral convexities, greatest along the sylvian fissures and anterior interhemispheric fissure. No detected progression from prior. Normal labyrinthine signal on both sides.  No masslike enhancement. No infarct, acute hemorrhage, hydrocephalus, or collection. Vascular: Normal flow voids and vascular enhancements. Skull and upper cervical spine: Normal marrow signal. Prior right temporal craniotomy. Sinuses/Orbits: Right mastoidectomy. No mastoid or middle ear opacification on either side. IMPRESSION: 1. Extensive superficial siderosis, including in the posterior fossa, without evident progression from 2019. 2. Negative for retrocochlear lesion. History of right mastoidectomy and repaired cephalocele with no evident recurrence. Electronically Signed   By: Monte Fantasia M.D.   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 Rare SABA use.  - PFTs 05/18/2020 FEV1 1.02 (66%), ratio 67/ Mild obstruction with positive bronchodilator response  - Trial ArmonAir  Respiclick (Fluticasone) 1 puff twice daily  -  Continue Albuterol 2 puffs every 4-6 hours prn shortness of breath/wheezing - FU televisit 2 weeks with APP    Martyn Ehrich, NP 05/18/2020

## 2020-05-29 DIAGNOSIS — N3091 Cystitis, unspecified with hematuria: Secondary | ICD-10-CM | POA: Diagnosis not present

## 2020-05-29 DIAGNOSIS — R1084 Generalized abdominal pain: Secondary | ICD-10-CM | POA: Diagnosis not present

## 2020-06-01 ENCOUNTER — Encounter: Payer: PPO | Admitting: Primary Care

## 2020-06-01 ENCOUNTER — Telehealth: Payer: Self-pay | Admitting: Primary Care

## 2020-06-01 ENCOUNTER — Other Ambulatory Visit: Payer: Self-pay

## 2020-06-01 NOTE — Telephone Encounter (Signed)
Pt was a no show for today's televisit 06/01/20. Nothing further needed.

## 2020-06-01 NOTE — Telephone Encounter (Signed)
Pt is scheduled for a televisit 06/01/20 at 3pm with Beth. Attempted to call the main number listed for [place] to call to do the televisit but unable to reach and unable to leave VM due to VM box not being set up. Attempted to call the other number listed on file for pt but unable to reach her at that number and also unable to leave a VM. Will try to call back.

## 2020-06-01 NOTE — Telephone Encounter (Signed)
Tried to call both numbers again that is listed for pt but unable to reach pt at either number and still unable to leave VM.

## 2020-06-07 DIAGNOSIS — K219 Gastro-esophageal reflux disease without esophagitis: Secondary | ICD-10-CM | POA: Diagnosis not present

## 2020-06-07 DIAGNOSIS — E782 Mixed hyperlipidemia: Secondary | ICD-10-CM | POA: Diagnosis not present

## 2020-06-29 DIAGNOSIS — H938X3 Other specified disorders of ear, bilateral: Secondary | ICD-10-CM | POA: Diagnosis not present

## 2020-06-29 DIAGNOSIS — Z974 Presence of external hearing-aid: Secondary | ICD-10-CM | POA: Diagnosis not present

## 2020-06-29 DIAGNOSIS — H90A31 Mixed conductive and sensorineural hearing loss, unilateral, right ear with restricted hearing on the contralateral side: Secondary | ICD-10-CM | POA: Insufficient documentation

## 2020-06-29 DIAGNOSIS — H90A22 Sensorineural hearing loss, unilateral, left ear, with restricted hearing on the contralateral side: Secondary | ICD-10-CM | POA: Diagnosis not present

## 2020-06-29 DIAGNOSIS — Z9089 Acquired absence of other organs: Secondary | ICD-10-CM

## 2020-06-29 DIAGNOSIS — G9601 Cranial cerebrospinal fluid leak, spontaneous: Secondary | ICD-10-CM | POA: Diagnosis not present

## 2020-06-29 DIAGNOSIS — Z9889 Other specified postprocedural states: Secondary | ICD-10-CM

## 2020-06-29 HISTORY — DX: Acquired absence of other organs: Z90.89

## 2020-06-29 HISTORY — DX: Other specified postprocedural states: Z98.890

## 2020-06-29 HISTORY — DX: Mixed conductive and sensorineural hearing loss, unilateral, right ear with restricted hearing on the contralateral side: H90.A31

## 2020-07-07 DIAGNOSIS — K219 Gastro-esophageal reflux disease without esophagitis: Secondary | ICD-10-CM | POA: Diagnosis not present

## 2020-07-07 DIAGNOSIS — E782 Mixed hyperlipidemia: Secondary | ICD-10-CM | POA: Diagnosis not present

## 2020-08-07 DIAGNOSIS — E782 Mixed hyperlipidemia: Secondary | ICD-10-CM | POA: Diagnosis not present

## 2020-08-07 DIAGNOSIS — K219 Gastro-esophageal reflux disease without esophagitis: Secondary | ICD-10-CM | POA: Diagnosis not present

## 2020-08-09 DIAGNOSIS — I700 Atherosclerosis of aorta: Secondary | ICD-10-CM | POA: Diagnosis not present

## 2020-08-09 DIAGNOSIS — E876 Hypokalemia: Secondary | ICD-10-CM | POA: Diagnosis not present

## 2020-08-09 DIAGNOSIS — R7309 Other abnormal glucose: Secondary | ICD-10-CM | POA: Diagnosis not present

## 2020-08-09 DIAGNOSIS — Z0000 Encounter for general adult medical examination without abnormal findings: Secondary | ICD-10-CM | POA: Diagnosis not present

## 2020-08-09 DIAGNOSIS — K219 Gastro-esophageal reflux disease without esophagitis: Secondary | ICD-10-CM | POA: Diagnosis not present

## 2020-08-09 DIAGNOSIS — R0789 Other chest pain: Secondary | ICD-10-CM | POA: Diagnosis not present

## 2020-08-09 DIAGNOSIS — E782 Mixed hyperlipidemia: Secondary | ICD-10-CM | POA: Diagnosis not present

## 2020-08-09 DIAGNOSIS — Z6828 Body mass index (BMI) 28.0-28.9, adult: Secondary | ICD-10-CM | POA: Diagnosis not present

## 2020-08-20 ENCOUNTER — Telehealth: Payer: Self-pay | Admitting: Cardiology

## 2020-08-20 NOTE — Telephone Encounter (Signed)
Pt advised to try Nitroglycerin and see if any change. Pt will callback and let us know how she is doing or if any change is noted. Pain is increased with movement.

## 2020-08-20 NOTE — Telephone Encounter (Signed)
Patient states that she is having pain in her shoulders and through her neck. She states that it is more so on her left side. States that when she raises her left arm it hurts. Denies chest pain, SOB, headaches, dizziness and fatigue. States she does get winded and does get lightheaded. She went to her PCP, Dr. Humphrey Rolls last week and he did blood work. He wanted to know when she was seeing Dr. Geraldo Pitter but she wasn't sure. She states he was going to send those results to our office but has not heard anything from anyone. She has an appt on 12/17 but wants to know if she should be seen sooner. Please advise.

## 2020-08-23 ENCOUNTER — Other Ambulatory Visit: Payer: Self-pay

## 2020-08-23 ENCOUNTER — Telehealth: Payer: Self-pay | Admitting: Gastroenterology

## 2020-08-23 DIAGNOSIS — Z5189 Encounter for other specified aftercare: Secondary | ICD-10-CM | POA: Insufficient documentation

## 2020-08-23 DIAGNOSIS — I10 Essential (primary) hypertension: Secondary | ICD-10-CM | POA: Insufficient documentation

## 2020-08-23 DIAGNOSIS — R519 Headache, unspecified: Secondary | ICD-10-CM | POA: Insufficient documentation

## 2020-08-23 DIAGNOSIS — H269 Unspecified cataract: Secondary | ICD-10-CM | POA: Insufficient documentation

## 2020-08-23 DIAGNOSIS — M1990 Unspecified osteoarthritis, unspecified site: Secondary | ICD-10-CM | POA: Insufficient documentation

## 2020-08-23 DIAGNOSIS — R011 Cardiac murmur, unspecified: Secondary | ICD-10-CM | POA: Insufficient documentation

## 2020-08-23 DIAGNOSIS — J45909 Unspecified asthma, uncomplicated: Secondary | ICD-10-CM | POA: Insufficient documentation

## 2020-08-23 DIAGNOSIS — E785 Hyperlipidemia, unspecified: Secondary | ICD-10-CM | POA: Insufficient documentation

## 2020-08-23 DIAGNOSIS — T7840XA Allergy, unspecified, initial encounter: Secondary | ICD-10-CM | POA: Insufficient documentation

## 2020-08-23 DIAGNOSIS — F419 Anxiety disorder, unspecified: Secondary | ICD-10-CM | POA: Insufficient documentation

## 2020-08-23 DIAGNOSIS — H409 Unspecified glaucoma: Secondary | ICD-10-CM | POA: Insufficient documentation

## 2020-08-23 DIAGNOSIS — F32A Depression, unspecified: Secondary | ICD-10-CM | POA: Insufficient documentation

## 2020-08-23 NOTE — Telephone Encounter (Signed)
Dr Dellis Filbert from [facility] is requesting a call back from a nurse to discuss the pt's medication list. Caller would like to also discuss the pt's recent test results.  CB [phone]

## 2020-08-23 NOTE — Telephone Encounter (Signed)
LM on Dr Assunta Curtis voicemail to contact the office

## 2020-08-24 ENCOUNTER — Other Ambulatory Visit: Payer: Self-pay

## 2020-08-24 ENCOUNTER — Encounter: Payer: Self-pay | Admitting: Cardiology

## 2020-08-24 ENCOUNTER — Ambulatory Visit: Payer: PPO | Admitting: Cardiology

## 2020-08-24 VITALS — BP 132/70 | HR 82 | Ht 63.0 in | Wt 158.8 lb

## 2020-08-24 DIAGNOSIS — I10 Essential (primary) hypertension: Secondary | ICD-10-CM

## 2020-08-24 DIAGNOSIS — H402212 Chronic angle-closure glaucoma, right eye, moderate stage: Secondary | ICD-10-CM | POA: Diagnosis not present

## 2020-08-24 DIAGNOSIS — E782 Mixed hyperlipidemia: Secondary | ICD-10-CM

## 2020-08-24 DIAGNOSIS — I2510 Atherosclerotic heart disease of native coronary artery without angina pectoris: Secondary | ICD-10-CM | POA: Diagnosis not present

## 2020-08-24 NOTE — Patient Instructions (Signed)

## 2020-08-24 NOTE — Progress Notes (Signed)
Cardiology Office Note:    Date:  08/24/2020   ID:  Sheila [name], DOB [birth year], MRN [id number]  PCP:  Mateo Flow, MD  Cardiologist:  Jenean Lindau, MD   Referring MD: Mateo Flow, MD    ASSESSMENT:    1. Coronary artery disease involving native coronary artery of native heart without angina pectoris   2. Essential hypertension   3. Mixed hyperlipidemia    PLAN:    In order of problems listed above:  1. Coronary artery disease: Secondary prevention stressed with the patient.  Importance of compliance with diet medication stressed and she vocalized understanding.  [facility] and [facility]ulated. 2. Essential hypertension: Blood pressure stable and diet was emphasized. 3. Mixed dyslipidemia: Lipids were reviewed and they are fine and I discussed this with her from the [facility] sheet.  Questions were answered to her satisfaction. 4. Patient will be seen in follow-up appointment in 6 months or earlier if the patient has any concerns    Medication Adjustments/Labs and Tests Ordered: Current medicines are reviewed at length with the patient today.  Concerns regarding medicines are outlined above.  No orders of the defined types were placed in this encounter.  No orders of the defined types were placed in this encounter.    No chief complaint on file.    History of Present Illness:    Sheila [name] is a 79 y.o. female.  Patient has past medical history of coronary artery disease, essential hypertension and mixed dyslipidemia.  Her hemoglobin A1c the last time was elevated.  She denies any problems at this time and takes care of activities of daily living.  No chest pain orthopnea or PND.  At the time of my evaluation, the patient is alert awake oriented and in no distress.  Past Medical History:  Diagnosis Date  . Allergy   . Anxiety   . Arthritis   . Asthma   . Blood transfusion without reported diagnosis   . Cataract   . Chest pain  07/02/2018  . Chronic headaches   . 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 Coronary artery disease involving native coronary artery of native heart without angina pectoris 10/10/2015  . Depression   . Dyslipidemia 10/10/2015  . Dyspnea 11/21/2019  . Essential hypertension 08/28/2016  . GERD (gastroesophageal reflux disease)   . Glaucoma   . Headache above the eye region 11/10/2017  . Heart murmur   . History of right mastoidectomy 06/29/2020  . Hyperlipidemia   . Hypertension   . 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 Mixed conductive and sensorineural hearing loss of right ear with restricted hearing of left ear 06/29/2020  . 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 CESAREAN SECTION    . COLONOSCOPY  05/16/2015   Moderate sigmoid diverticulosis.   Marland Kitchen EYE SURGERY    . LEFT HEART CATH AND CORONARY ANGIOGRAPHY N/A 12/28/2017   Procedure: LEFT HEART CATH AND CORONARY ANGIOGRAPHY;  Surgeon: Belva Crome, MD;  Location: [facility];  Service: Cardiovascular;  Laterality: N/A;  . MASTOIDECTOMY    . PATENT DUCTUS ARTERIOUS REPAIR    . SHOULDER OPEN ROTATOR CUFF REPAIR Bilateral     Current Medications: Current Meds  Medication Sig  . Albuterol Sulfate, sensor, (PROAIR DIGIHALER) 108 (90 Base) MCG/ACT AEPB Inhale 2 puffs into the lungs every 6 (six) hours as needed.  . ALPRAZolam Duanne Moron)  0.25 MG tablet Take 0.25 mg by mouth 2 (two) times daily as needed for anxiety.  Marland Kitchen amLODipine (NORVASC) 2.5 MG tablet Take 1 tablet by mouth daily.  Marland Kitchen aspirin EC 81 MG tablet Take 81 mg by mouth daily.  Marland Kitchen azelastine (ASTELIN) 0.1 % nasal spray Place 2 sprays into both nostrils 2 (two) times daily.  . carvedilol (COREG) 6.25 MG tablet Take 6.25 mg by mouth 2 (two) times daily with a meal.  . Coenzyme Q10 (CO Q-10) 200 MG CAPS Take 1 capsule by mouth daily.   . dorzolamide (TRUSOPT) 2 % ophthalmic solution Place 1 drop into both eyes daily.    . famotidine (PEPCID) 40 MG tablet Take 40 mg by mouth daily.  . fluticasone (FLONASE) 50 MCG/ACT nasal spray Place 2 sprays into both nostrils 2 (two) times daily as needed.  . Fluticasone Propionate,sensor, (ARMONAIR DIGIHALER) 113 MCG/ACT AEPB Inhale 1 puff into the lungs in the morning and at bedtime.  . furosemide (LASIX) 40 MG tablet Take 20 mg by mouth daily.  Marland Kitchen latanoprost (XALATAN) 0.005 % ophthalmic solution 1 drop at bedtime.  Marland Kitchen levocetirizine (XYZAL) 5 MG tablet Take 5 mg by mouth daily.  Marland Kitchen linaclotide (LINZESS) 72 MCG capsule Take 72 mcg by mouth daily.  . meclizine (ANTIVERT) 12.5 MG tablet Take 12.5 mg by mouth 4 (four) times daily as needed for dizziness. Was given 25mg  tablet to break in half  . pantoprazole (PROTONIX) 40 MG tablet Take 40 mg by mouth daily.  . potassium chloride (KLOR-CON) 20 MEQ packet Take 10 mEq by mouth daily.  . rosuvastatin (CRESTOR) 10 MG tablet Take 10 mg by mouth daily.  . sodium chloride (MURO 128) 2 % ophthalmic solution Place 1 drop into the right eye 2 (two) times daily.  . sucralfate (CARAFATE) 1 g tablet Take 1 g by mouth 3 (three) times daily.   . timolol (TIMOPTIC) 0.5 % ophthalmic solution 1 drop 2 (two) times daily. in both eyes.  Marland Kitchen tiZANidine (ZANAFLEX) 2 MG tablet Take 2 mg by mouth at bedtime.  . traMADol-acetaminophen (ULTRACET) 37.5-325 MG tablet Take 1-2 tablets by mouth as needed.  . Wheat Dextrin (BENEFIBER PO) Take by mouth 2 (two) times daily.   Current Facility-Administered Medications for the 08/24/20 encounter (Office Visit) with Maitri Schnoebelen, Reita Cliche, MD  Medication  . 0.9 %  sodium chloride infusion     Allergies:   Antihistamines, chlorpheniramine-type; Ativan [lorazepam]; Ceftin [cefuroxime axetil]; Meloxicam; Pheneen [benzalkonium chloride]; and Morphine   Social History   Socioeconomic History  . Marital status: Divorced    Spouse name: Not on file  . Number of children: 5  . Years of education: Not on file  .  Highest education level: Not on file  Occupational History  . Occupation: Retired   Tobacco Use  . Smoking status: Never Smoker  . Smokeless tobacco: Never Used  Vaping Use  . Vaping Use: Never used  Substance and Sexual Activity  . Alcohol use: No  . Drug use: Never  . Sexual activity: Not on file  Other Topics Concern  . Not on file  Social History Narrative  . Not on file   Social Determinants of Health   Financial Resource Strain: Not on file  Food Insecurity: Not on file  Transportation Needs: Not on file  Physical Activity: Not on file  Stress: Not on file  Social Connections: Not on file     Family History: The patient's family history includes Colon cancer in  her brother; Diabetes in her brother and daughter; Heart attack in her mother and sister; Stomach cancer in her sister. There is no history of Esophageal cancer.  ROS:   Please see the history of present illness.    All other systems reviewed and are negative.  EKGs/Labs/Other Studies Reviewed:    The following studies were reviewed today: I discussed my findings with the patient at length.   Recent Labs: 02/21/2020: BUN 11; Creatinine, Ser 0.77; Potassium 4.2; Sodium 141  Recent Lipid Panel No results found for: CHOL, TRIG, HDL, CHOLHDL, VLDL, LDLCALC, LDLDIRECT  Physical Exam:    VS:  BP 132/70   Pulse 82   Ht 5\' 3"  (1.6 m)   Wt 158 lb 12.8 oz (72 kg)   SpO2 99%   BMI 28.13 kg/m     Wt Readings from Last 3 Encounters:  08/24/20 158 lb 12.8 oz (72 kg)  05/18/20 159 lb (72.1 kg)  02/21/20 161 lb (73 kg)     GEN: Patient is in no acute distress HEENT: Normal NECK: No JVD; No carotid bruits LYMPHATICS: No lymphadenopathy CARDIAC: Hear sounds regular, 2/6 systolic murmur at the apex. RESPIRATORY:  Clear to auscultation without rales, wheezing or rhonchi  ABDOMEN: Soft, non-tender, non-distended MUSCULOSKELETAL:  No edema; No deformity  SKIN: Warm and dry NEUROLOGIC:  Alert and oriented x  3 PSYCHIATRIC:  Normal affect   Signed, Jenean Lindau, MD  08/24/2020 2:24 PM    [place]

## 2020-09-06 DIAGNOSIS — E782 Mixed hyperlipidemia: Secondary | ICD-10-CM | POA: Diagnosis not present

## 2020-09-06 DIAGNOSIS — E118 Type 2 diabetes mellitus with unspecified complications: Secondary | ICD-10-CM | POA: Diagnosis not present

## 2020-09-06 DIAGNOSIS — I10 Essential (primary) hypertension: Secondary | ICD-10-CM | POA: Diagnosis not present

## 2020-09-26 DIAGNOSIS — J310 Chronic rhinitis: Secondary | ICD-10-CM | POA: Diagnosis not present

## 2020-09-26 DIAGNOSIS — M353 Polymyalgia rheumatica: Secondary | ICD-10-CM | POA: Diagnosis not present

## 2020-09-26 DIAGNOSIS — Z9181 History of falling: Secondary | ICD-10-CM | POA: Diagnosis not present

## 2020-09-26 DIAGNOSIS — R2681 Unsteadiness on feet: Secondary | ICD-10-CM | POA: Diagnosis not present

## 2020-09-26 DIAGNOSIS — J449 Chronic obstructive pulmonary disease, unspecified: Secondary | ICD-10-CM | POA: Diagnosis not present

## 2020-09-26 DIAGNOSIS — Z6828 Body mass index (BMI) 28.0-28.9, adult: Secondary | ICD-10-CM | POA: Diagnosis not present

## 2020-10-05 DIAGNOSIS — H402212 Chronic angle-closure glaucoma, right eye, moderate stage: Secondary | ICD-10-CM | POA: Diagnosis not present

## 2020-10-07 DIAGNOSIS — K219 Gastro-esophageal reflux disease without esophagitis: Secondary | ICD-10-CM | POA: Diagnosis not present

## 2020-10-07 DIAGNOSIS — E782 Mixed hyperlipidemia: Secondary | ICD-10-CM | POA: Diagnosis not present

## 2020-10-19 DIAGNOSIS — R079 Chest pain, unspecified: Secondary | ICD-10-CM | POA: Diagnosis not present

## 2020-10-19 DIAGNOSIS — J189 Pneumonia, unspecified organism: Secondary | ICD-10-CM | POA: Diagnosis not present

## 2020-10-19 DIAGNOSIS — J9811 Atelectasis: Secondary | ICD-10-CM | POA: Diagnosis not present

## 2020-10-19 DIAGNOSIS — I25118 Atherosclerotic heart disease of native coronary artery with other forms of angina pectoris: Secondary | ICD-10-CM | POA: Diagnosis not present

## 2020-10-19 DIAGNOSIS — E041 Nontoxic single thyroid nodule: Secondary | ICD-10-CM | POA: Diagnosis not present

## 2020-10-19 DIAGNOSIS — R160 Hepatomegaly, not elsewhere classified: Secondary | ICD-10-CM | POA: Diagnosis not present

## 2020-10-19 DIAGNOSIS — I083 Combined rheumatic disorders of mitral, aortic and tricuspid valves: Secondary | ICD-10-CM | POA: Diagnosis not present

## 2020-10-19 DIAGNOSIS — M4316 Spondylolisthesis, lumbar region: Secondary | ICD-10-CM | POA: Diagnosis not present

## 2020-10-19 DIAGNOSIS — I509 Heart failure, unspecified: Secondary | ICD-10-CM | POA: Diagnosis not present

## 2020-10-19 DIAGNOSIS — R9431 Abnormal electrocardiogram [ECG] [EKG]: Secondary | ICD-10-CM | POA: Diagnosis not present

## 2020-10-19 DIAGNOSIS — K219 Gastro-esophageal reflux disease without esophagitis: Secondary | ICD-10-CM | POA: Diagnosis not present

## 2020-10-19 DIAGNOSIS — J449 Chronic obstructive pulmonary disease, unspecified: Secondary | ICD-10-CM | POA: Diagnosis not present

## 2020-10-19 DIAGNOSIS — K5730 Diverticulosis of large intestine without perforation or abscess without bleeding: Secondary | ICD-10-CM | POA: Diagnosis not present

## 2020-10-19 DIAGNOSIS — Z7982 Long term (current) use of aspirin: Secondary | ICD-10-CM | POA: Diagnosis not present

## 2020-10-19 DIAGNOSIS — E785 Hyperlipidemia, unspecified: Secondary | ICD-10-CM | POA: Diagnosis not present

## 2020-10-19 DIAGNOSIS — Z20822 Contact with and (suspected) exposure to covid-19: Secondary | ICD-10-CM | POA: Diagnosis not present

## 2020-10-19 DIAGNOSIS — R072 Precordial pain: Secondary | ICD-10-CM | POA: Diagnosis not present

## 2020-10-19 DIAGNOSIS — I10 Essential (primary) hypertension: Secondary | ICD-10-CM | POA: Diagnosis not present

## 2020-10-19 DIAGNOSIS — I110 Hypertensive heart disease with heart failure: Secondary | ICD-10-CM | POA: Diagnosis not present

## 2020-10-19 DIAGNOSIS — Z23 Encounter for immunization: Secondary | ICD-10-CM | POA: Diagnosis not present

## 2020-10-19 DIAGNOSIS — I700 Atherosclerosis of aorta: Secondary | ICD-10-CM | POA: Diagnosis not present

## 2020-10-19 DIAGNOSIS — R0789 Other chest pain: Secondary | ICD-10-CM | POA: Diagnosis not present

## 2020-10-19 DIAGNOSIS — Z79899 Other long term (current) drug therapy: Secondary | ICD-10-CM | POA: Diagnosis not present

## 2020-10-19 DIAGNOSIS — I2510 Atherosclerotic heart disease of native coronary artery without angina pectoris: Secondary | ICD-10-CM | POA: Diagnosis not present

## 2020-10-19 DIAGNOSIS — K820 Obstruction of gallbladder: Secondary | ICD-10-CM | POA: Diagnosis not present

## 2020-10-19 DIAGNOSIS — D259 Leiomyoma of uterus, unspecified: Secondary | ICD-10-CM | POA: Diagnosis not present

## 2020-10-20 DIAGNOSIS — I517 Cardiomegaly: Secondary | ICD-10-CM | POA: Diagnosis not present

## 2020-10-20 DIAGNOSIS — R079 Chest pain, unspecified: Secondary | ICD-10-CM | POA: Diagnosis not present

## 2020-10-20 DIAGNOSIS — K219 Gastro-esophageal reflux disease without esophagitis: Secondary | ICD-10-CM | POA: Diagnosis not present

## 2020-10-20 DIAGNOSIS — R072 Precordial pain: Secondary | ICD-10-CM | POA: Diagnosis not present

## 2020-10-20 DIAGNOSIS — I10 Essential (primary) hypertension: Secondary | ICD-10-CM | POA: Diagnosis not present

## 2020-10-20 DIAGNOSIS — I2510 Atherosclerotic heart disease of native coronary artery without angina pectoris: Secondary | ICD-10-CM | POA: Diagnosis not present

## 2020-10-24 ENCOUNTER — Other Ambulatory Visit: Payer: Self-pay

## 2020-10-24 MED ORDER — PANTOPRAZOLE SODIUM 40 MG PO TBEC: 40.0000 mg | DELAYED_RELEASE_TABLET | Freq: Every day | ORAL | 0 refills | Status: DC

## 2020-10-30 ENCOUNTER — Other Ambulatory Visit: Payer: Self-pay

## 2020-10-30 ENCOUNTER — Encounter: Payer: Self-pay | Admitting: Sports Medicine

## 2020-10-30 ENCOUNTER — Other Ambulatory Visit: Payer: Self-pay | Admitting: Sports Medicine

## 2020-10-30 ENCOUNTER — Ambulatory Visit (INDEPENDENT_AMBULATORY_CARE_PROVIDER_SITE_OTHER): Payer: PPO

## 2020-10-30 ENCOUNTER — Ambulatory Visit: Payer: PPO | Admitting: Sports Medicine

## 2020-10-30 DIAGNOSIS — M779 Enthesopathy, unspecified: Secondary | ICD-10-CM

## 2020-10-30 DIAGNOSIS — M792 Neuralgia and neuritis, unspecified: Secondary | ICD-10-CM

## 2020-10-30 DIAGNOSIS — M25571 Pain in right ankle and joints of right foot: Secondary | ICD-10-CM

## 2020-10-30 DIAGNOSIS — R7303 Prediabetes: Secondary | ICD-10-CM

## 2020-10-30 DIAGNOSIS — D1720 Benign lipomatous neoplasm of skin and subcutaneous tissue of unspecified limb: Secondary | ICD-10-CM

## 2020-10-30 DIAGNOSIS — L309 Dermatitis, unspecified: Secondary | ICD-10-CM | POA: Diagnosis not present

## 2020-10-30 DIAGNOSIS — D1723 Benign lipomatous neoplasm of skin and subcutaneous tissue of right leg: Secondary | ICD-10-CM | POA: Diagnosis not present

## 2020-10-30 MED ORDER — NYSTATIN-TRIAMCINOLONE 100000-0.1 UNIT/GM-% EX OINT: 1.0000 "application " | TOPICAL_OINTMENT | Freq: Two times a day (BID) | CUTANEOUS | 0 refills | Status: DC

## 2020-10-30 NOTE — Progress Notes (Signed)
Subjective: Sheila [name] is a 80 y.o. female patient who presents to office for evaluation of 1.  Right ankle pain that has a knot reports that the knot comes and goes does not bother her that often but did notice that it was larger and her primary doctor looked at it and thought that it was nothing to be of concern but she wants to have it checked.  Patient also complains of 2.  Pain of burning and tingling at night that has been going on for several months reports that she has had recent issues with her thyroid and is concerned about the increase in burning feet at night.  Patient also complains of 3.  An itchy rash to the tops of her legs and the top of the left foot and ankle with changes to the veins at her ankles reports that she mentioned this as well to her PCP last visit and he did not recommend anything.  Patient denies any other pedal complaints. Denies injury/trip/fall/sprain/any other causative factors.   Review of systems noncontributory  Patient Active Problem List   Diagnosis Date Noted  . Allergy   . Anxiety   . Arthritis   . Asthma   . Blood transfusion without reported diagnosis   . Cataract   . Chronic headaches   . Depression   . Glaucoma   . Heart murmur   . Hyperlipidemia   . Hypertension   . History of right mastoidectomy 06/29/2020  . Mixed conductive and sensorineural hearing loss of right ear with restricted hearing of left ear 06/29/2020  . Status post craniectomy 06/29/2020  . 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 Dyspnea 11/21/2019  . 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 Chest pain 07/02/2018  . Headache above the eye region 11/10/2017  . Essential hypertension 08/28/2016  . GERD (gastroesophageal reflux disease) 08/28/2016  . Coronary artery disease involving native coronary artery of native heart without angina pectoris 10/10/2015  . Dyslipidemia 10/10/2015    Current Outpatient Medications on File Prior to Visit  Medication Sig  Dispense Refill  . acetaminophen (TYLENOL) 325 MG tablet Take by mouth.    . Albuterol Sulfate, sensor, (PROAIR DIGIHALER) 108 (90 Base) MCG/ACT AEPB Inhale 2 puffs into the lungs every 6 (six) hours as needed.    . ALPRAZolam (XANAX) 0.25 MG tablet Take 0.25 mg by mouth 2 (two) times daily as needed for anxiety.    Marland Kitchen amLODipine (NORVASC) 2.5 MG tablet Take 1 tablet by mouth daily.    Marland Kitchen amLODipine (NORVASC) 5 MG tablet Take by mouth.    Marland Kitchen aspirin EC 81 MG tablet Take 81 mg by mouth daily.    Marland Kitchen azelastine (ASTELIN) 0.1 % nasal spray Place 2 sprays into both nostrils 2 (two) times daily.    Marland Kitchen azithromycin (ZITHROMAX) 500 MG tablet Take 500 mg by mouth daily.    . carvedilol (COREG) 6.25 MG tablet Take 6.25 mg by mouth 2 (two) times daily with a meal.    . Coenzyme Q10 (CO Q-10) 200 MG CAPS Take 1 capsule by mouth daily.     . COMBIGAN 0.2-0.5 % ophthalmic solution Apply 1 drop to eye 2 (two) times daily.    . dorzolamide (TRUSOPT) 2 % ophthalmic solution Place 1 drop into both eyes daily.     . famotidine (PEPCID) 40 MG tablet Take 40 mg by mouth daily.    Marland Kitchen FLOVENT HFA 110 MCG/ACT inhaler Inhale 1 puff into the lungs 2 (two) times daily.    Marland Kitchen  fluticasone (FLONASE) 50 MCG/ACT nasal spray Place 2 sprays into both nostrils 2 (two) times daily as needed.    . Fluticasone Propionate,sensor, (ARMONAIR DIGIHALER) 113 MCG/ACT AEPB Inhale 1 puff into the lungs in the morning and at bedtime. 1 each 0  . furosemide (LASIX) 40 MG tablet Take 20 mg by mouth daily.    . isosorbide mononitrate (IMDUR) 30 MG 24 hr tablet Take 1 tablet (30 mg total) by mouth daily. 90 tablet 1  . latanoprost (XALATAN) 0.005 % ophthalmic solution 1 drop at bedtime.    Marland Kitchen levocetirizine (XYZAL) 5 MG tablet Take 5 mg by mouth daily.    Marland Kitchen linaclotide (LINZESS) 72 MCG capsule Take 72 mcg by mouth daily.    . meclizine (ANTIVERT) 12.5 MG tablet Take 12.5 mg by mouth 4 (four) times daily as needed for dizziness. Was given 25mg  tablet to  break in half    . metoCLOPramide (REGLAN) 10 MG tablet Take 1 tablet (10 mg total) by mouth 3 (three) times daily before meals for 14 days. 42 tablet 0  . nitroGLYCERIN (NITROSTAT) 0.4 MG SL tablet Place 1 tablet (0.4 mg total) under the tongue every 5 (five) minutes as needed for chest pain. 25 tablet 11  . pantoprazole (PROTONIX) 40 MG tablet Take 1 tablet (40 mg total) by mouth daily. Please call [phone] to schedule an appointment for more refills 90 tablet 0  . potassium chloride (KLOR-CON) 20 MEQ packet Take 10 mEq by mouth daily.    . rosuvastatin (CRESTOR) 10 MG tablet Take 10 mg by mouth daily.    . sodium chloride (MURO 128) 2 % ophthalmic solution Place 1 drop into the right eye 2 (two) times daily.    . sucralfate (CARAFATE) 1 g tablet Take 1 g by mouth 3 (three) times daily.     . timolol (TIMOPTIC) 0.5 % ophthalmic solution 1 drop 2 (two) times daily. in both eyes.    Marland Kitchen tiZANidine (ZANAFLEX) 2 MG tablet Take 2 mg by mouth at bedtime.    . traMADol-acetaminophen (ULTRACET) 37.5-325 MG tablet Take 1-2 tablets by mouth as needed.    . Wheat Dextrin (BENEFIBER PO) Take by mouth 2 (two) times daily.     Current Facility-Administered Medications on File Prior to Visit  Medication Dose Route Frequency Provider Last Rate Last Admin  . 0.9 %  sodium chloride infusion  500 mL Intravenous Once Jackquline Denmark, MD        Allergies  Allergen Reactions  . Antihistamines, Chlorpheniramine-Type Anaphylaxis  . Ativan [Lorazepam] Swelling  . Ceftin [Cefuroxime Axetil]   . Meloxicam   . Pheneen [Benzalkonium Chloride]   . Morphine Palpitations    Objective:  General: Alert and oriented x3 in no acute distress  Dermatology: Soft tissue mass at dorsal lateral right foot and ankle consistent with lipoma and normal fat over the sinus tarsi with no acute concerns for malignancy.  There is also a raised pruritic rash noted to the dorsums of both ankles and anterior shin on the left and top of  left foot, no open lesions bilateral lower extremities, no webspace macerations, no ecchymosis bilateral, all nails x 10 are well manicured.  Vascular: Dorsalis Pedis and Posterior Tibial pedal pulses palpable, Capillary Fill Time 5 seconds, scant pedal hair growth bilateral, minimal edema bilateral lower extremities, moderate varicosities noted bilateral ankles, temperature gradient within normal limits.  Neurology: Gross sensation intact via light touch bilateral, subjective burning and tingling at night.  Musculoskeletal: No reproducible tenderness  to palpation bilateral no instability or frank pain noted to right ankle.  Strength within normal limits in all groups bilateral.   Gait: Non-Antalgic gait  Xrays  Right ankle   Impression: No acute osseous findings there is very minimal joint space narrowing  Assessment and Plan: Problem List Items Addressed This Visit   None   Visit Diagnoses    Lipoma of lower extremity, unspecified laterality    -  Primary   Tendonitis       Relevant Orders   DG Ankle Complete Right   Acute right ankle pain       Neuritis       Dermatitis       Relevant Medications   nystatin-triamcinolone ointment (MYCOLOG)   Borderline diabetic           -Complete examination performed -Xrays reviewed -Discussed care for her pedal concerns -Recommend to continue to monitor lipoma at right lateral foot and ankle since there is no distinct or constant pain this is not worrisome however did advise patient if pain worsens or if the mass enlarges to return to office for follow-up evaluation -Prescribed Mycolog ointment for patient to use to areas of dermatitis to anterior shin and dorsums of ankle and left foot -Advised patient to discuss with PCP at tomorrow's visit checking her vitamin B level and to do a further work-up for neuropathy likely patient may be experiencing neuropathy that precedes diagnosis of diabetes -Patient to return to office if fails to  continue to improve or sooner if condition worsens.  Landis Martins, DPM

## 2020-10-31 DIAGNOSIS — R2681 Unsteadiness on feet: Secondary | ICD-10-CM | POA: Diagnosis not present

## 2020-10-31 DIAGNOSIS — Z6828 Body mass index (BMI) 28.0-28.9, adult: Secondary | ICD-10-CM | POA: Diagnosis not present

## 2020-10-31 DIAGNOSIS — E041 Nontoxic single thyroid nodule: Secondary | ICD-10-CM | POA: Diagnosis not present

## 2020-10-31 DIAGNOSIS — J189 Pneumonia, unspecified organism: Secondary | ICD-10-CM | POA: Diagnosis not present

## 2020-11-05 DIAGNOSIS — E782 Mixed hyperlipidemia: Secondary | ICD-10-CM | POA: Diagnosis not present

## 2020-11-05 DIAGNOSIS — K219 Gastro-esophageal reflux disease without esophagitis: Secondary | ICD-10-CM | POA: Diagnosis not present

## 2020-11-10 DIAGNOSIS — T7840XA Allergy, unspecified, initial encounter: Secondary | ICD-10-CM | POA: Diagnosis not present

## 2020-11-20 DIAGNOSIS — L298 Other pruritus: Secondary | ICD-10-CM | POA: Diagnosis not present

## 2020-11-20 DIAGNOSIS — Z7952 Long term (current) use of systemic steroids: Secondary | ICD-10-CM | POA: Diagnosis not present

## 2020-11-20 DIAGNOSIS — T7840XD Allergy, unspecified, subsequent encounter: Secondary | ICD-10-CM | POA: Diagnosis not present

## 2020-11-27 DIAGNOSIS — Z1231 Encounter for screening mammogram for malignant neoplasm of breast: Secondary | ICD-10-CM | POA: Diagnosis not present

## 2020-12-20 DIAGNOSIS — M6281 Muscle weakness (generalized): Secondary | ICD-10-CM | POA: Diagnosis not present

## 2020-12-20 DIAGNOSIS — R2681 Unsteadiness on feet: Secondary | ICD-10-CM | POA: Diagnosis not present

## 2020-12-20 DIAGNOSIS — R2689 Other abnormalities of gait and mobility: Secondary | ICD-10-CM | POA: Diagnosis not present

## 2020-12-20 DIAGNOSIS — R208 Other disturbances of skin sensation: Secondary | ICD-10-CM | POA: Diagnosis not present

## 2020-12-25 DIAGNOSIS — H6121 Impacted cerumen, right ear: Secondary | ICD-10-CM | POA: Diagnosis not present

## 2020-12-25 DIAGNOSIS — R519 Headache, unspecified: Secondary | ICD-10-CM | POA: Diagnosis not present

## 2020-12-25 DIAGNOSIS — R2689 Other abnormalities of gait and mobility: Secondary | ICD-10-CM | POA: Diagnosis not present

## 2020-12-25 DIAGNOSIS — G8929 Other chronic pain: Secondary | ICD-10-CM | POA: Diagnosis not present

## 2020-12-25 DIAGNOSIS — J3489 Other specified disorders of nose and nasal sinuses: Secondary | ICD-10-CM | POA: Diagnosis not present

## 2021-01-01 DIAGNOSIS — R2689 Other abnormalities of gait and mobility: Secondary | ICD-10-CM | POA: Diagnosis not present

## 2021-01-01 DIAGNOSIS — H6121 Impacted cerumen, right ear: Secondary | ICD-10-CM | POA: Diagnosis not present

## 2021-01-01 DIAGNOSIS — H90A31 Mixed conductive and sensorineural hearing loss, unilateral, right ear with restricted hearing on the contralateral side: Secondary | ICD-10-CM | POA: Diagnosis not present

## 2021-01-01 DIAGNOSIS — J3489 Other specified disorders of nose and nasal sinuses: Secondary | ICD-10-CM | POA: Diagnosis not present

## 2021-01-04 DIAGNOSIS — H402212 Chronic angle-closure glaucoma, right eye, moderate stage: Secondary | ICD-10-CM | POA: Diagnosis not present

## 2021-01-08 DIAGNOSIS — R1013 Epigastric pain: Secondary | ICD-10-CM | POA: Diagnosis not present

## 2021-01-08 DIAGNOSIS — Z6828 Body mass index (BMI) 28.0-28.9, adult: Secondary | ICD-10-CM | POA: Diagnosis not present

## 2021-01-08 DIAGNOSIS — R2681 Unsteadiness on feet: Secondary | ICD-10-CM | POA: Diagnosis not present

## 2021-01-14 ENCOUNTER — Other Ambulatory Visit: Payer: Self-pay | Admitting: Cardiology

## 2021-01-25 DIAGNOSIS — R519 Headache, unspecified: Secondary | ICD-10-CM | POA: Diagnosis not present

## 2021-01-25 DIAGNOSIS — R2689 Other abnormalities of gait and mobility: Secondary | ICD-10-CM | POA: Diagnosis not present

## 2021-01-25 DIAGNOSIS — J3489 Other specified disorders of nose and nasal sinuses: Secondary | ICD-10-CM | POA: Diagnosis not present

## 2021-01-25 DIAGNOSIS — G8929 Other chronic pain: Secondary | ICD-10-CM | POA: Diagnosis not present

## 2021-01-25 DIAGNOSIS — R42 Dizziness and giddiness: Secondary | ICD-10-CM | POA: Diagnosis not present

## 2021-02-04 DIAGNOSIS — E782 Mixed hyperlipidemia: Secondary | ICD-10-CM | POA: Diagnosis not present

## 2021-02-04 DIAGNOSIS — K219 Gastro-esophageal reflux disease without esophagitis: Secondary | ICD-10-CM | POA: Diagnosis not present

## 2021-02-14 DIAGNOSIS — J189 Pneumonia, unspecified organism: Secondary | ICD-10-CM | POA: Diagnosis not present

## 2021-02-14 DIAGNOSIS — Z6828 Body mass index (BMI) 28.0-28.9, adult: Secondary | ICD-10-CM | POA: Diagnosis not present

## 2021-02-14 DIAGNOSIS — R0789 Other chest pain: Secondary | ICD-10-CM | POA: Diagnosis not present

## 2021-02-18 ENCOUNTER — Other Ambulatory Visit: Payer: Self-pay

## 2021-02-21 ENCOUNTER — Ambulatory Visit: Payer: PPO | Admitting: Cardiology

## 2021-02-21 ENCOUNTER — Encounter: Payer: Self-pay | Admitting: Cardiology

## 2021-02-21 ENCOUNTER — Other Ambulatory Visit: Payer: Self-pay

## 2021-02-21 VITALS — BP 134/72 | HR 67 | Ht 64.0 in | Wt 157.0 lb

## 2021-02-21 DIAGNOSIS — E782 Mixed hyperlipidemia: Secondary | ICD-10-CM

## 2021-02-21 DIAGNOSIS — I10 Essential (primary) hypertension: Secondary | ICD-10-CM | POA: Diagnosis not present

## 2021-02-21 DIAGNOSIS — E785 Hyperlipidemia, unspecified: Secondary | ICD-10-CM | POA: Diagnosis not present

## 2021-02-21 DIAGNOSIS — I2510 Atherosclerotic heart disease of native coronary artery without angina pectoris: Secondary | ICD-10-CM

## 2021-02-21 DIAGNOSIS — I350 Nonrheumatic aortic (valve) stenosis: Secondary | ICD-10-CM | POA: Insufficient documentation

## 2021-02-21 HISTORY — DX: Nonrheumatic aortic (valve) stenosis: I35.0

## 2021-02-21 NOTE — Progress Notes (Signed)
Cardiology Office Note:    Date:  02/21/2021  -Echocardiogram with ID:  Sheila [name], DOB [birth year], MRN [id number]  PCP:  Sheila Flow, MD  Cardiologist:  Sheila Lindau, MD   Referring MD: Sheila Flow, MD    ASSESSMENT:    1. Coronary artery disease involving native coronary artery of native heart without angina pectoris   2. Essential hypertension   3. Mixed hyperlipidemia   4. Mild aortic stenosis   5. Dyslipidemia    PLAN:    In order of problems listed above:  Coronary artery disease: Secondary prevention stressed with the patient.  Importance of compliance with diet medication stressed and she vocalized understanding.  [facility]  She does this 2-3 times a week. Essential hypertension: Blood pressure stable and diet was emphasized. Mixed dyslipidemia: Lipids were reviewed and discussed with her.  Diet emphasized.  Exercise stressed. Mild aortic stenosis: Stable at this time medical management. Patient will be seen in follow-up appointment in 6 months or earlier if the patient has any concerns    Medication Adjustments/Labs and Tests Ordered: Current medicines are reviewed at length with the patient today.  Concerns regarding medicines are outlined above.  Orders Placed This Encounter  Procedures   EKG 12-Lead   No orders of the defined types were placed in this encounter.    No chief complaint on file.    History of Present Illness:    Sheila [name] is a 80 y.o. female.  Patient has past medical history of coronary artery disease, essential hypertension, dyslipidemia and mild aortic stenosis.  She denies any problems at this time and takes care of activities of daily living.  No chest pain orthopnea or PND.  She went to [facility] hospital for elevated blood pressure and was evaluated and treated and released.  She has brought the report of echocardiogram with her and this reveals  concentric hypertrophy with mild aortic stenosis.  At the time of my evaluation, the patient is alert awake oriented and in no distress.  Past Medical History:  Diagnosis Date   Allergy    Anxiety    Arthritis    Asthma    Blood transfusion without reported diagnosis    Cataract    Chest pain 07/02/2018   Chronic headaches    Chronic obstructive asthma ([place]) 05/18/2020   Coronary artery disease involving native coronary artery of native heart without angina pectoris 10/10/2015   Depression    Dyslipidemia 10/10/2015   Dyspnea 11/21/2019   Essential hypertension 08/28/2016   GERD (gastroesophageal reflux disease)    Glaucoma    Headache above the eye region 11/10/2017   Heart murmur    History of right mastoidectomy 06/29/2020   Hyperlipidemia    Hypertension    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    EYE SURGERY     LEFT HEART CATH AND CORONARY ANGIOGRAPHY N/A 12/28/2017   Procedure: LEFT HEART CATH AND CORONARY ANGIOGRAPHY;  Surgeon: Belva Crome, MD;  Location: [facility];  Service: Cardiovascular;  Laterality: N/A;   MASTOIDECTOMY     PATENT DUCTUS ARTERIOUS REPAIR  SHOULDER OPEN ROTATOR CUFF REPAIR Bilateral     Current Medications: Current Meds  Medication Sig   Albuterol Sulfate, sensor, (PROAIR DIGIHALER) 108 (90 Base) MCG/ACT AEPB Inhale 2 puffs into the lungs every 6 (six) hours as needed for wheezing or shortness of breath.   ALPRAZolam (XANAX) 0.25 MG tablet Take 0.25 mg by mouth 2 (two) times daily as needed for anxiety.   amLODipine (NORVASC) 2.5 MG tablet Take 1 tablet by mouth daily.   aspirin EC 81 MG tablet Take 81 mg by mouth daily.   carvedilol (COREG) 6.25 MG tablet  Take 6.25 mg by mouth 2 (two) times daily with a meal.   Coenzyme Q10 (CO Q-10) 200 MG CAPS Take 1 capsule by mouth daily.    dorzolamide (TRUSOPT) 2 % ophthalmic solution Place 1 drop into both eyes daily.    FLOVENT HFA 110 MCG/ACT inhaler Inhale 1 puff into the lungs 2 (two) times daily.   fluticasone (FLONASE) 50 MCG/ACT nasal spray Place 2 sprays into both nostrils daily.   Fluticasone Propionate,sensor, (ARMONAIR DIGIHALER) 113 MCG/ACT AEPB Inhale 1 puff into the lungs in the morning and at bedtime.   furosemide (LASIX) 40 MG tablet Take 20 mg by mouth daily.   isosorbide mononitrate (IMDUR) 30 MG 24 hr tablet Take 30 mg by mouth every morning.   linaclotide (LINZESS) 72 MCG capsule Take 72 mcg by mouth daily.   loteprednol (LOTEMAX) 0.5 % ophthalmic suspension Place 1 drop into both eyes 4 (four) times daily.   meclizine (ANTIVERT) 12.5 MG tablet Take 12.5 mg by mouth 4 (four) times daily as needed for dizziness. Was given 25mg  tablet to break in half   Multiple Vitamin (MULTIVITAMIN) tablet Take 1 tablet by mouth daily.   nitroGLYCERIN (NITROSTAT) 0.4 MG SL tablet Place 0.4 mg under the tongue every 5 (five) minutes as needed for chest pain.   nystatin-triamcinolone ointment (MYCOLOG) Apply 1 application topically 2 (two) times daily.   pantoprazole (PROTONIX) 40 MG tablet Take 1 tablet (40 mg total) by mouth daily. Please call [phone] to schedule an appointment for more refills   potassium chloride (MICRO-K) 10 MEQ CR capsule Take 10 mEq by mouth every morning.   rosuvastatin (CRESTOR) 10 MG tablet Take 10 mg by mouth daily.   timolol (TIMOPTIC) 0.5 % ophthalmic solution Place 1 drop into both eyes 2 (two) times daily.   traMADol-acetaminophen (ULTRACET) 37.5-325 MG tablet Take 1-2 tablets by mouth 2 (two) times daily as needed for pain.     Allergies:   Antihistamines, chlorpheniramine-type; Ativan [lorazepam]; Ceftin [cefuroxime axetil]; Meloxicam; Pheneen [benzalkonium  chloride]; and Morphine   Social History   Socioeconomic History   Marital status: Divorced    Spouse name: Not on file   Number of children: 5   Years of education: Not on file   Highest education level: Not on file  Occupational History   Occupation: Retired   Tobacco Use   Smoking status: Never   Smokeless tobacco: Never  Vaping Use   Vaping Use: Never used  Substance and Sexual Activity   Alcohol use: No   Drug use: Never   Sexual activity: Not on file  Other Topics Concern   Not on file  Social History Narrative   Not on file   Social Determinants of Health   Financial Resource Strain: Not on file  Food Insecurity: Not on file  Transportation Needs: Not on file  Physical Activity: Not on file  Stress: Not on file  Social Connections: Not on file     Family History: The patient's family history includes Colon cancer in her brother; Diabetes in her brother and daughter; Heart attack in her mother and sister; Stomach cancer in her sister. There is no history of Esophageal cancer.  ROS:   Please see the history of present illness.    All other systems reviewed and are negative.  EKGs/Labs/Other Studies Reviewed:    The following studies were reviewed today: I discussed my findings with the patient at length.   Recent Labs: No results found for requested labs within last 8760 hours.  Recent Lipid Panel No results found for: CHOL, TRIG, HDL, CHOLHDL, VLDL, LDLCALC, LDLDIRECT  Physical Exam:    VS:  BP 134/72   Pulse 67   Ht 5\' 4"  (1.626 m)   Wt 157 lb (71.2 kg)   SpO2 99%   BMI 26.95 kg/m     Wt Readings from Last 3 Encounters:  02/21/21 157 lb (71.2 kg)  08/24/20 158 lb 12.8 oz (72 kg)  05/18/20 159 lb (72.1 kg)     GEN: Patient is in no acute distress HEENT: Normal NECK: No JVD; No carotid bruits LYMPHATICS: No lymphadenopathy CARDIAC: Hear sounds regular, 2/6 systolic murmur at the apex. RESPIRATORY:  Clear to auscultation without rales,  wheezing or rhonchi  ABDOMEN: Soft, non-tender, non-distended MUSCULOSKELETAL:  No edema; No deformity  SKIN: Warm and dry NEUROLOGIC:  Alert and oriented x 3 PSYCHIATRIC:  Normal affect   Signed, Sheila Lindau, MD  02/21/2021 11:11 AM    [place]

## 2021-02-21 NOTE — Patient Instructions (Signed)

## 2021-03-07 DIAGNOSIS — K219 Gastro-esophageal reflux disease without esophagitis: Secondary | ICD-10-CM | POA: Diagnosis not present

## 2021-03-07 DIAGNOSIS — E782 Mixed hyperlipidemia: Secondary | ICD-10-CM | POA: Diagnosis not present

## 2021-03-14 DIAGNOSIS — Z6828 Body mass index (BMI) 28.0-28.9, adult: Secondary | ICD-10-CM | POA: Diagnosis not present

## 2021-03-14 DIAGNOSIS — E041 Nontoxic single thyroid nodule: Secondary | ICD-10-CM | POA: Diagnosis not present

## 2021-03-14 DIAGNOSIS — J189 Pneumonia, unspecified organism: Secondary | ICD-10-CM | POA: Diagnosis not present

## 2021-03-14 DIAGNOSIS — R2689 Other abnormalities of gait and mobility: Secondary | ICD-10-CM | POA: Diagnosis not present

## 2021-03-26 DIAGNOSIS — R42 Dizziness and giddiness: Secondary | ICD-10-CM | POA: Diagnosis not present

## 2021-03-27 DIAGNOSIS — L989 Disorder of the skin and subcutaneous tissue, unspecified: Secondary | ICD-10-CM | POA: Diagnosis not present

## 2021-03-27 DIAGNOSIS — Z6828 Body mass index (BMI) 28.0-28.9, adult: Secondary | ICD-10-CM | POA: Diagnosis not present

## 2021-03-30 DIAGNOSIS — R42 Dizziness and giddiness: Secondary | ICD-10-CM | POA: Diagnosis not present

## 2021-03-30 DIAGNOSIS — Z20828 Contact with and (suspected) exposure to other viral communicable diseases: Secondary | ICD-10-CM | POA: Diagnosis not present

## 2021-03-30 DIAGNOSIS — R519 Headache, unspecified: Secondary | ICD-10-CM | POA: Diagnosis not present

## 2021-03-30 DIAGNOSIS — R0981 Nasal congestion: Secondary | ICD-10-CM | POA: Diagnosis not present

## 2021-03-30 DIAGNOSIS — M791 Myalgia, unspecified site: Secondary | ICD-10-CM | POA: Diagnosis not present

## 2021-03-30 DIAGNOSIS — R5383 Other fatigue: Secondary | ICD-10-CM | POA: Diagnosis not present

## 2021-04-05 DIAGNOSIS — J189 Pneumonia, unspecified organism: Secondary | ICD-10-CM | POA: Diagnosis not present

## 2021-04-05 DIAGNOSIS — E041 Nontoxic single thyroid nodule: Secondary | ICD-10-CM | POA: Diagnosis not present

## 2021-04-05 DIAGNOSIS — R590 Localized enlarged lymph nodes: Secondary | ICD-10-CM | POA: Diagnosis not present

## 2021-04-05 DIAGNOSIS — R42 Dizziness and giddiness: Secondary | ICD-10-CM | POA: Diagnosis not present

## 2021-04-05 DIAGNOSIS — I700 Atherosclerosis of aorta: Secondary | ICD-10-CM | POA: Diagnosis not present

## 2021-04-11 DIAGNOSIS — E041 Nontoxic single thyroid nodule: Secondary | ICD-10-CM | POA: Diagnosis not present

## 2021-04-11 DIAGNOSIS — Z6828 Body mass index (BMI) 28.0-28.9, adult: Secondary | ICD-10-CM | POA: Diagnosis not present

## 2021-04-16 DIAGNOSIS — E041 Nontoxic single thyroid nodule: Secondary | ICD-10-CM | POA: Diagnosis not present

## 2021-05-08 DIAGNOSIS — K219 Gastro-esophageal reflux disease without esophagitis: Secondary | ICD-10-CM | POA: Diagnosis not present

## 2021-05-08 DIAGNOSIS — E782 Mixed hyperlipidemia: Secondary | ICD-10-CM | POA: Diagnosis not present

## 2021-05-09 DIAGNOSIS — H90A31 Mixed conductive and sensorineural hearing loss, unilateral, right ear with restricted hearing on the contralateral side: Secondary | ICD-10-CM | POA: Diagnosis not present

## 2021-05-09 DIAGNOSIS — R2689 Other abnormalities of gait and mobility: Secondary | ICD-10-CM | POA: Diagnosis not present

## 2021-05-09 DIAGNOSIS — Z9089 Acquired absence of other organs: Secondary | ICD-10-CM | POA: Diagnosis not present

## 2021-05-09 DIAGNOSIS — H60391 Other infective otitis externa, right ear: Secondary | ICD-10-CM | POA: Insufficient documentation

## 2021-05-09 HISTORY — DX: Other infective otitis externa, right ear: H60.391

## 2021-05-11 DIAGNOSIS — R079 Chest pain, unspecified: Secondary | ICD-10-CM | POA: Diagnosis not present

## 2021-05-11 DIAGNOSIS — M940 Chondrocostal junction syndrome [Tietze]: Secondary | ICD-10-CM | POA: Diagnosis not present

## 2021-05-11 DIAGNOSIS — I10 Essential (primary) hypertension: Secondary | ICD-10-CM | POA: Diagnosis not present

## 2021-05-11 DIAGNOSIS — I77810 Thoracic aortic ectasia: Secondary | ICD-10-CM | POA: Diagnosis not present

## 2021-05-11 DIAGNOSIS — J984 Other disorders of lung: Secondary | ICD-10-CM | POA: Diagnosis not present

## 2021-05-11 DIAGNOSIS — I701 Atherosclerosis of renal artery: Secondary | ICD-10-CM | POA: Diagnosis not present

## 2021-05-11 DIAGNOSIS — I161 Hypertensive emergency: Secondary | ICD-10-CM | POA: Diagnosis not present

## 2021-05-11 DIAGNOSIS — R0789 Other chest pain: Secondary | ICD-10-CM | POA: Diagnosis not present

## 2021-05-11 DIAGNOSIS — I2510 Atherosclerotic heart disease of native coronary artery without angina pectoris: Secondary | ICD-10-CM | POA: Diagnosis not present

## 2021-05-11 DIAGNOSIS — K219 Gastro-esophageal reflux disease without esophagitis: Secondary | ICD-10-CM | POA: Diagnosis not present

## 2021-05-11 DIAGNOSIS — I700 Atherosclerosis of aorta: Secondary | ICD-10-CM | POA: Diagnosis not present

## 2021-05-11 DIAGNOSIS — K5730 Diverticulosis of large intestine without perforation or abscess without bleeding: Secondary | ICD-10-CM | POA: Diagnosis not present

## 2021-05-11 DIAGNOSIS — N281 Cyst of kidney, acquired: Secondary | ICD-10-CM | POA: Diagnosis not present

## 2021-05-11 DIAGNOSIS — J398 Other specified diseases of upper respiratory tract: Secondary | ICD-10-CM | POA: Diagnosis not present

## 2021-05-11 DIAGNOSIS — K8689 Other specified diseases of pancreas: Secondary | ICD-10-CM | POA: Diagnosis not present

## 2021-05-11 DIAGNOSIS — E7800 Pure hypercholesterolemia, unspecified: Secondary | ICD-10-CM | POA: Diagnosis not present

## 2021-05-11 DIAGNOSIS — I7090 Unspecified atherosclerosis: Secondary | ICD-10-CM | POA: Diagnosis not present

## 2021-05-11 LAB — PROTIME-INR: INR: 1.1 (ref 0.9–1.1)

## 2021-05-12 DIAGNOSIS — R001 Bradycardia, unspecified: Secondary | ICD-10-CM | POA: Diagnosis not present

## 2021-05-12 DIAGNOSIS — Z7982 Long term (current) use of aspirin: Secondary | ICD-10-CM | POA: Diagnosis not present

## 2021-05-12 DIAGNOSIS — I161 Hypertensive emergency: Secondary | ICD-10-CM | POA: Diagnosis not present

## 2021-05-12 DIAGNOSIS — I2510 Atherosclerotic heart disease of native coronary artery without angina pectoris: Secondary | ICD-10-CM | POA: Diagnosis not present

## 2021-05-12 DIAGNOSIS — E7800 Pure hypercholesterolemia, unspecified: Secondary | ICD-10-CM | POA: Diagnosis not present

## 2021-05-12 DIAGNOSIS — I700 Atherosclerosis of aorta: Secondary | ICD-10-CM | POA: Diagnosis not present

## 2021-05-12 DIAGNOSIS — K219 Gastro-esophageal reflux disease without esophagitis: Secondary | ICD-10-CM | POA: Diagnosis not present

## 2021-05-12 DIAGNOSIS — R079 Chest pain, unspecified: Secondary | ICD-10-CM | POA: Diagnosis not present

## 2021-05-12 DIAGNOSIS — J398 Other specified diseases of upper respiratory tract: Secondary | ICD-10-CM | POA: Diagnosis not present

## 2021-05-12 DIAGNOSIS — M1990 Unspecified osteoarthritis, unspecified site: Secondary | ICD-10-CM | POA: Diagnosis not present

## 2021-05-12 DIAGNOSIS — K5730 Diverticulosis of large intestine without perforation or abscess without bleeding: Secondary | ICD-10-CM | POA: Diagnosis not present

## 2021-05-12 DIAGNOSIS — F419 Anxiety disorder, unspecified: Secondary | ICD-10-CM | POA: Diagnosis not present

## 2021-05-12 DIAGNOSIS — I701 Atherosclerosis of renal artery: Secondary | ICD-10-CM | POA: Diagnosis not present

## 2021-05-12 DIAGNOSIS — Z8673 Personal history of transient ischemic attack (TIA), and cerebral infarction without residual deficits: Secondary | ICD-10-CM | POA: Diagnosis not present

## 2021-05-12 DIAGNOSIS — J984 Other disorders of lung: Secondary | ICD-10-CM | POA: Diagnosis not present

## 2021-05-12 DIAGNOSIS — I10 Essential (primary) hypertension: Secondary | ICD-10-CM | POA: Diagnosis not present

## 2021-05-12 DIAGNOSIS — Z888 Allergy status to other drugs, medicaments and biological substances status: Secondary | ICD-10-CM | POA: Diagnosis not present

## 2021-05-12 DIAGNOSIS — I7090 Unspecified atherosclerosis: Secondary | ICD-10-CM | POA: Diagnosis not present

## 2021-05-12 DIAGNOSIS — K8689 Other specified diseases of pancreas: Secondary | ICD-10-CM | POA: Diagnosis not present

## 2021-05-12 DIAGNOSIS — M19012 Primary osteoarthritis, left shoulder: Secondary | ICD-10-CM | POA: Diagnosis not present

## 2021-05-12 DIAGNOSIS — Z79899 Other long term (current) drug therapy: Secondary | ICD-10-CM | POA: Diagnosis not present

## 2021-05-12 DIAGNOSIS — R0789 Other chest pain: Secondary | ICD-10-CM | POA: Diagnosis not present

## 2021-05-12 DIAGNOSIS — M19011 Primary osteoarthritis, right shoulder: Secondary | ICD-10-CM | POA: Diagnosis not present

## 2021-05-12 DIAGNOSIS — Z885 Allergy status to narcotic agent status: Secondary | ICD-10-CM | POA: Diagnosis not present

## 2021-05-12 DIAGNOSIS — I77810 Thoracic aortic ectasia: Secondary | ICD-10-CM | POA: Diagnosis not present

## 2021-05-12 DIAGNOSIS — J449 Chronic obstructive pulmonary disease, unspecified: Secondary | ICD-10-CM | POA: Diagnosis not present

## 2021-05-12 DIAGNOSIS — F32A Depression, unspecified: Secondary | ICD-10-CM | POA: Diagnosis not present

## 2021-05-12 DIAGNOSIS — K7689 Other specified diseases of liver: Secondary | ICD-10-CM | POA: Diagnosis not present

## 2021-05-12 DIAGNOSIS — Z881 Allergy status to other antibiotic agents status: Secondary | ICD-10-CM | POA: Diagnosis not present

## 2021-05-12 DIAGNOSIS — M940 Chondrocostal junction syndrome [Tietze]: Secondary | ICD-10-CM | POA: Diagnosis not present

## 2021-05-12 DIAGNOSIS — R55 Syncope and collapse: Secondary | ICD-10-CM | POA: Diagnosis not present

## 2021-05-12 DIAGNOSIS — N281 Cyst of kidney, acquired: Secondary | ICD-10-CM | POA: Diagnosis not present

## 2021-05-13 DIAGNOSIS — I2510 Atherosclerotic heart disease of native coronary artery without angina pectoris: Secondary | ICD-10-CM | POA: Diagnosis not present

## 2021-05-13 DIAGNOSIS — E7800 Pure hypercholesterolemia, unspecified: Secondary | ICD-10-CM | POA: Diagnosis not present

## 2021-05-13 DIAGNOSIS — I10 Essential (primary) hypertension: Secondary | ICD-10-CM | POA: Diagnosis not present

## 2021-05-13 DIAGNOSIS — R079 Chest pain, unspecified: Secondary | ICD-10-CM | POA: Diagnosis not present

## 2021-05-14 DIAGNOSIS — I10 Essential (primary) hypertension: Secondary | ICD-10-CM | POA: Diagnosis not present

## 2021-05-14 DIAGNOSIS — I2510 Atherosclerotic heart disease of native coronary artery without angina pectoris: Secondary | ICD-10-CM | POA: Diagnosis not present

## 2021-05-14 DIAGNOSIS — E7800 Pure hypercholesterolemia, unspecified: Secondary | ICD-10-CM | POA: Diagnosis not present

## 2021-05-14 DIAGNOSIS — R079 Chest pain, unspecified: Secondary | ICD-10-CM | POA: Diagnosis not present

## 2021-05-15 DIAGNOSIS — D485 Neoplasm of uncertain behavior of skin: Secondary | ICD-10-CM | POA: Diagnosis not present

## 2021-05-15 DIAGNOSIS — L299 Pruritus, unspecified: Secondary | ICD-10-CM | POA: Diagnosis not present

## 2021-05-21 DIAGNOSIS — R079 Chest pain, unspecified: Secondary | ICD-10-CM | POA: Diagnosis not present

## 2021-05-21 DIAGNOSIS — E261 Secondary hyperaldosteronism: Secondary | ICD-10-CM | POA: Diagnosis not present

## 2021-05-21 DIAGNOSIS — Z7982 Long term (current) use of aspirin: Secondary | ICD-10-CM | POA: Diagnosis not present

## 2021-05-21 DIAGNOSIS — M940 Chondrocostal junction syndrome [Tietze]: Secondary | ICD-10-CM | POA: Diagnosis not present

## 2021-05-21 DIAGNOSIS — I10 Essential (primary) hypertension: Secondary | ICD-10-CM | POA: Diagnosis not present

## 2021-05-21 DIAGNOSIS — K219 Gastro-esophageal reflux disease without esophagitis: Secondary | ICD-10-CM | POA: Diagnosis not present

## 2021-05-21 DIAGNOSIS — J449 Chronic obstructive pulmonary disease, unspecified: Secondary | ICD-10-CM | POA: Diagnosis not present

## 2021-05-21 DIAGNOSIS — I509 Heart failure, unspecified: Secondary | ICD-10-CM | POA: Diagnosis not present

## 2021-05-21 DIAGNOSIS — I700 Atherosclerosis of aorta: Secondary | ICD-10-CM | POA: Diagnosis not present

## 2021-05-21 DIAGNOSIS — F321 Major depressive disorder, single episode, moderate: Secondary | ICD-10-CM | POA: Diagnosis not present

## 2021-05-22 ENCOUNTER — Telehealth: Payer: Self-pay | Admitting: Cardiology

## 2021-05-22 NOTE — Telephone Encounter (Signed)
Patient called to schedule a hospital f/u with Dr. Geraldo Pitter. She was told to see him within 3 weeks. There were no available appointments with Dr. Geraldo Pitter in [place]. She wanted to know if she could see Dr. Bettina Gavia because she saw him while she was in the hospital

## 2021-05-22 NOTE — Telephone Encounter (Signed)
Appointment made for pt.

## 2021-05-27 ENCOUNTER — Other Ambulatory Visit: Payer: Self-pay

## 2021-05-28 ENCOUNTER — Ambulatory Visit: Payer: PPO | Admitting: Cardiology

## 2021-05-28 ENCOUNTER — Encounter: Payer: Self-pay | Admitting: Cardiology

## 2021-05-28 ENCOUNTER — Other Ambulatory Visit: Payer: Self-pay

## 2021-05-28 VITALS — BP 144/64 | HR 58 | Ht 64.0 in | Wt 155.4 lb

## 2021-05-28 DIAGNOSIS — E782 Mixed hyperlipidemia: Secondary | ICD-10-CM | POA: Diagnosis not present

## 2021-05-28 DIAGNOSIS — I2510 Atherosclerotic heart disease of native coronary artery without angina pectoris: Secondary | ICD-10-CM | POA: Diagnosis not present

## 2021-05-28 DIAGNOSIS — I10 Essential (primary) hypertension: Secondary | ICD-10-CM | POA: Diagnosis not present

## 2021-05-28 NOTE — Progress Notes (Signed)
Cardiology Office Note:    Date:  05/28/2021   ID:  Sheila [name], DOB [birth year], MRN [id number]  PCP:  Mateo Flow, MD  Cardiologist:  Jenean Lindau, MD   Referring MD: Mateo Flow, MD    ASSESSMENT:    1. Coronary artery disease involving native coronary artery of native heart without angina pectoris   2. Essential hypertension   3. Mixed hyperlipidemia    PLAN:    In order of problems listed above:  Coronary artery disease: Nonobstructive in nature: Her stress test and evaluation at [facility] was negative and she is going to see her primary care and gastroenterologist for noncoronary etiologies of chest pain.  Her history is atypical for coronary etiology.  She is walking on a regular basis now and happy about it. Essential hypertension: Blood pressure stable and diet was emphasized.  Lifestyle modification urged. Mixed dyslipidemia: Lipids reviewed and discussed with the patient.  This is followed by primary care.  Lifestyle modification urged and diet emphasized. Patient will be seen in follow-up appointment in 6 months or earlier if the patient has any concerns    Medication Adjustments/Labs and Tests Ordered: Current medicines are reviewed at length with the patient today.  Concerns regarding medicines are outlined above.  No orders of the defined types were placed in this encounter.  No orders of the defined types were placed in this encounter.    No chief complaint on file.    History of Present Illness:    Sheila [name] is a 80 y.o. female.  Patient has past medical history of coronary artery disease nonobstructive in nature, essential hypertension dyslipidemia.  She is an active lady.  She denies any chest pain orthopnea or PND.  She came to the hospital with chest pain which was not typical for angina.  She underwent coronary angiography a few years ago and had nonobstructive disease.  Her chest pain has features of costochondritis.  She  ambulates without any symptoms.  She has gastroesophageal reflux disease and plans to see a gastroenterologist in the next few days.  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[place]) 05/18/2020   Coronary artery disease involving native coronary artery of native heart without angina pectoris 10/10/2015   Depression    Dyslipidemia 10/10/2015   Dyspnea 11/21/2019   Essential hypertension 08/28/2016   GERD (gastroesophageal reflux disease)    Glaucoma    Headache above the eye region 11/10/2017   Heart murmur    History of right mastoidectomy 06/29/2020   Hyperlipidemia    Hypertension    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    EYE SURGERY     LEFT HEART CATH AND CORONARY ANGIOGRAPHY N/A 12/28/2017   Procedure: LEFT HEART CATH AND CORONARY ANGIOGRAPHY;  Surgeon: Belva Crome, MD;  Location: [facility];  Service: Cardiovascular;  Laterality: N/A;  MASTOIDECTOMY     PATENT DUCTUS ARTERIOUS REPAIR     SHOULDER OPEN ROTATOR CUFF REPAIR Bilateral     Current Medications: Current Meds  Medication Sig   Albuterol Sulfate, sensor, (PROAIR DIGIHALER) 108 (90 Base) MCG/ACT AEPB Inhale 2 puffs into the lungs every 6 (six) hours as needed for wheezing or shortness of breath.   ALPRAZolam (XANAX) 0.25 MG tablet Take 0.25 mg by mouth 2 (two) times daily as needed for anxiety.   amLODipine (NORVASC) 2.5 MG tablet Take 1 tablet by mouth daily.    aspirin EC 81 MG tablet Take 81 mg by mouth daily.   carvedilol (COREG) 6.25 MG tablet Take 6.25 mg by mouth 2 (two) times daily with a meal.   Coenzyme Q10 (CO Q-10) 200 MG CAPS Take 1 capsule by mouth daily.    dorzolamide (TRUSOPT) 2 % ophthalmic solution Place 1 drop into both eyes daily.    FLOVENT HFA 110 MCG/ACT inhaler Inhale 1 puff into the lungs 2 (two) times daily.   fluticasone (FLONASE) 50 MCG/ACT nasal spray Place 2 sprays into both nostrils daily.   Fluticasone Propionate,sensor, (ARMONAIR DIGIHALER) 113 MCG/ACT AEPB Inhale 1 puff into the lungs in the morning and at bedtime.   furosemide (LASIX) 40 MG tablet Take 20 mg by mouth daily.   hydrALAZINE (APRESOLINE) 25 MG tablet Take 12.5 mg by mouth 3 (three) times daily.   isosorbide mononitrate (IMDUR) 30 MG 24 hr tablet Take 30 mg by mouth every morning.   linaclotide (LINZESS) 72 MCG capsule Take 72 mcg by mouth daily.   loteprednol (LOTEMAX) 0.5 % ophthalmic suspension Place 1 drop into both eyes 4 (four) times daily.   Multiple Vitamin (MULTIVITAMIN) tablet Take 1 tablet by mouth daily.   nitroGLYCERIN (NITROSTAT) 0.4 MG SL tablet Place 0.4 mg under the tongue every 5 (five) minutes as needed for chest pain.   nystatin-triamcinolone ointment (MYCOLOG) Apply 1 application topically 2 (two) times daily.   pantoprazole (PROTONIX) 40 MG tablet Take 1 tablet (40 mg total) by mouth daily. Please call [phone] to schedule an appointment for more refills   potassium chloride (MICRO-K) 10 MEQ CR capsule Take 10 mEq by mouth every morning.   rosuvastatin (CRESTOR) 10 MG tablet Take 10 mg by mouth daily.   timolol (TIMOPTIC) 0.5 % ophthalmic solution Place 1 drop into both eyes 2 (two) times daily.   traMADol-acetaminophen (ULTRACET) 37.5-325 MG tablet Take 1-2 tablets by mouth 2 (two) times daily as needed for pain.     Allergies:   Antihistamines, chlorpheniramine-type; Ativan [lorazepam]; Ceftin [cefuroxime axetil]; Meloxicam;  Pheneen [benzalkonium chloride]; and Morphine   Social History   Socioeconomic History   Marital status: Divorced    Spouse name: Not on file   Number of children: 5   Years of education: Not on file   Highest education level: Not on file  Occupational History   Occupation: Retired   Tobacco Use   Smoking status: Never   Smokeless tobacco: Never  Vaping Use   Vaping Use: Never used  Substance and Sexual Activity   Alcohol use: No   Drug use: Never   Sexual activity: Not on file  Other Topics Concern   Not on file  Social History Narrative   Not on file   Social Determinants of Health   Financial Resource Strain: Not on file  Food Insecurity: Not on file  Transportation Needs: Not on file  Physical Activity: Not on file  Stress: Not on file  Social Connections: Not on file     Family History: The patient's family history includes Colon cancer in her brother; Diabetes in her brother and daughter; Heart attack in her mother and sister; Stomach cancer in her sister. There is no history of Esophageal cancer.  ROS:   Please see the history of present illness.    All other systems reviewed and are negative.  EKGs/Labs/Other Studies Reviewed:    The following studies were reviewed today: I reviewed records from [place] and discussed with her.   Recent Labs: No results found for requested labs within last 8760 hours.  Recent Lipid Panel No results found for: CHOL, TRIG, HDL, CHOLHDL, VLDL, LDLCALC, LDLDIRECT  Physical Exam:    VS:  BP (!) 144/64   Pulse (!) 58   Ht 5\' 4"  (1.626 m)   Wt 155 lb 6.4 oz (70.5 kg)   SpO2 97%   BMI 26.67 kg/m     Wt Readings from Last 3 Encounters:  05/28/21 155 lb 6.4 oz (70.5 kg)  02/21/21 157 lb (71.2 kg)  08/24/20 158 lb 12.8 oz (72 kg)     GEN: Patient is in no acute distress HEENT: Normal NECK: No JVD; No carotid bruits LYMPHATICS: No lymphadenopathy CARDIAC: Hear sounds regular, 2/6 systolic murmur at the  apex. RESPIRATORY:  Clear to auscultation without rales, wheezing or rhonchi  ABDOMEN: Soft, non-tender, non-distended MUSCULOSKELETAL:  No edema; No deformity  SKIN: Warm and dry NEUROLOGIC:  Alert and oriented x 3 PSYCHIATRIC:  Normal affect   Signed, Jenean Lindau, MD  05/28/2021 2:01 PM    [place]

## 2021-05-28 NOTE — Patient Instructions (Signed)

## 2021-07-04 DIAGNOSIS — Z9889 Other specified postprocedural states: Secondary | ICD-10-CM | POA: Diagnosis not present

## 2021-07-04 DIAGNOSIS — Z974 Presence of external hearing-aid: Secondary | ICD-10-CM | POA: Diagnosis not present

## 2021-07-04 DIAGNOSIS — H90A31 Mixed conductive and sensorineural hearing loss, unilateral, right ear with restricted hearing on the contralateral side: Secondary | ICD-10-CM | POA: Diagnosis not present

## 2021-07-04 DIAGNOSIS — Z9089 Acquired absence of other organs: Secondary | ICD-10-CM | POA: Diagnosis not present

## 2021-07-08 DIAGNOSIS — K219 Gastro-esophageal reflux disease without esophagitis: Secondary | ICD-10-CM | POA: Diagnosis not present

## 2021-07-08 DIAGNOSIS — E782 Mixed hyperlipidemia: Secondary | ICD-10-CM | POA: Diagnosis not present

## 2021-07-08 DIAGNOSIS — I10 Essential (primary) hypertension: Secondary | ICD-10-CM | POA: Diagnosis not present

## 2021-07-14 ENCOUNTER — Other Ambulatory Visit: Payer: Self-pay | Admitting: Cardiology

## 2021-07-16 DIAGNOSIS — M7552 Bursitis of left shoulder: Secondary | ICD-10-CM | POA: Diagnosis not present

## 2021-07-16 DIAGNOSIS — Z23 Encounter for immunization: Secondary | ICD-10-CM | POA: Diagnosis not present

## 2021-07-17 DIAGNOSIS — Z961 Presence of intraocular lens: Secondary | ICD-10-CM | POA: Diagnosis not present

## 2021-07-17 DIAGNOSIS — H402232 Chronic angle-closure glaucoma, bilateral, moderate stage: Secondary | ICD-10-CM | POA: Diagnosis not present

## 2021-07-23 ENCOUNTER — Other Ambulatory Visit: Payer: Self-pay | Admitting: Cardiology

## 2021-08-06 DIAGNOSIS — R9431 Abnormal electrocardiogram [ECG] [EKG]: Secondary | ICD-10-CM | POA: Diagnosis not present

## 2021-08-06 DIAGNOSIS — E7800 Pure hypercholesterolemia, unspecified: Secondary | ICD-10-CM | POA: Diagnosis not present

## 2021-08-06 DIAGNOSIS — I10 Essential (primary) hypertension: Secondary | ICD-10-CM | POA: Diagnosis not present

## 2021-08-06 DIAGNOSIS — R0789 Other chest pain: Secondary | ICD-10-CM | POA: Diagnosis not present

## 2021-08-06 DIAGNOSIS — R072 Precordial pain: Secondary | ICD-10-CM | POA: Diagnosis not present

## 2021-08-06 DIAGNOSIS — R079 Chest pain, unspecified: Secondary | ICD-10-CM | POA: Diagnosis not present

## 2021-08-07 DIAGNOSIS — E782 Mixed hyperlipidemia: Secondary | ICD-10-CM | POA: Diagnosis not present

## 2021-08-07 DIAGNOSIS — I10 Essential (primary) hypertension: Secondary | ICD-10-CM | POA: Diagnosis not present

## 2021-08-07 DIAGNOSIS — K219 Gastro-esophageal reflux disease without esophagitis: Secondary | ICD-10-CM | POA: Diagnosis not present

## 2021-08-13 DIAGNOSIS — Z7722 Contact with and (suspected) exposure to environmental tobacco smoke (acute) (chronic): Secondary | ICD-10-CM | POA: Diagnosis not present

## 2021-08-13 DIAGNOSIS — I10 Essential (primary) hypertension: Secondary | ICD-10-CM | POA: Diagnosis not present

## 2021-08-13 DIAGNOSIS — J449 Chronic obstructive pulmonary disease, unspecified: Secondary | ICD-10-CM | POA: Diagnosis not present

## 2021-08-13 DIAGNOSIS — E785 Hyperlipidemia, unspecified: Secondary | ICD-10-CM | POA: Diagnosis not present

## 2021-08-13 DIAGNOSIS — Z8719 Personal history of other diseases of the digestive system: Secondary | ICD-10-CM | POA: Diagnosis not present

## 2021-08-13 DIAGNOSIS — E119 Type 2 diabetes mellitus without complications: Secondary | ICD-10-CM | POA: Diagnosis not present

## 2021-08-15 ENCOUNTER — Other Ambulatory Visit: Payer: Self-pay

## 2021-08-16 ENCOUNTER — Other Ambulatory Visit: Payer: Self-pay

## 2021-08-16 ENCOUNTER — Encounter: Payer: Self-pay | Admitting: Cardiology

## 2021-08-16 ENCOUNTER — Ambulatory Visit (INDEPENDENT_AMBULATORY_CARE_PROVIDER_SITE_OTHER): Payer: PPO | Admitting: Cardiology

## 2021-08-16 VITALS — BP 136/70 | HR 52 | Ht 63.6 in | Wt 158.4 lb

## 2021-08-16 DIAGNOSIS — I10 Essential (primary) hypertension: Secondary | ICD-10-CM | POA: Diagnosis not present

## 2021-08-16 DIAGNOSIS — I2510 Atherosclerotic heart disease of native coronary artery without angina pectoris: Secondary | ICD-10-CM | POA: Diagnosis not present

## 2021-08-16 DIAGNOSIS — E782 Mixed hyperlipidemia: Secondary | ICD-10-CM

## 2021-08-16 DIAGNOSIS — I350 Nonrheumatic aortic (valve) stenosis: Secondary | ICD-10-CM | POA: Diagnosis not present
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
## 2021-08-16 NOTE — Patient Instructions (Signed)

## 2021-08-26 DIAGNOSIS — K219 Gastro-esophageal reflux disease without esophagitis: Secondary | ICD-10-CM | POA: Diagnosis not present

## 2021-09-05 DIAGNOSIS — J069 Acute upper respiratory infection, unspecified: Secondary | ICD-10-CM | POA: Diagnosis not present

## 2021-09-05 DIAGNOSIS — R197 Diarrhea, unspecified: Secondary | ICD-10-CM | POA: Diagnosis not present

## 2021-09-05 DIAGNOSIS — Z20828 Contact with and (suspected) exposure to other viral communicable diseases: Secondary | ICD-10-CM | POA: Diagnosis not present

## 2021-09-06 ENCOUNTER — Ambulatory Visit: Payer: PPO | Admitting: Gastroenterology

## 2021-09-06 DIAGNOSIS — R1013 Epigastric pain: Secondary | ICD-10-CM | POA: Diagnosis not present

## 2021-09-06 DIAGNOSIS — U071 COVID-19: Secondary | ICD-10-CM | POA: Diagnosis not present

## 2021-09-11 DIAGNOSIS — U071 COVID-19: Secondary | ICD-10-CM | POA: Diagnosis not present

## 2021-09-13 DIAGNOSIS — Z20828 Contact with and (suspected) exposure to other viral communicable diseases: Secondary | ICD-10-CM | POA: Diagnosis not present

## 2021-09-13 DIAGNOSIS — Z8616 Personal history of COVID-19: Secondary | ICD-10-CM | POA: Diagnosis not present

## 2021-09-19 ENCOUNTER — Encounter: Payer: Self-pay | Admitting: Gastroenterology

## 2021-09-19 ENCOUNTER — Other Ambulatory Visit: Payer: Self-pay

## 2021-09-19 ENCOUNTER — Ambulatory Visit (INDEPENDENT_AMBULATORY_CARE_PROVIDER_SITE_OTHER): Payer: PPO | Admitting: Gastroenterology

## 2021-09-19 VITALS — BP 160/78 | HR 65 | Ht 63.6 in | Wt 153.0 lb

## 2021-09-19 DIAGNOSIS — R1319 Other dysphagia: Secondary | ICD-10-CM

## 2021-09-19 DIAGNOSIS — K219 Gastro-esophageal reflux disease without esophagitis: Secondary | ICD-10-CM

## 2021-09-19 DIAGNOSIS — R1013 Epigastric pain: Secondary | ICD-10-CM

## 2021-09-19 MED ORDER — PANTOPRAZOLE SODIUM 40 MG PO TBEC: 40.0000 mg | DELAYED_RELEASE_TABLET | Freq: Every day | ORAL | 4 refills | Status: DC

## 2021-09-19 NOTE — Patient Instructions (Addendum)
If you are age 80 or older, your body mass index should be between 23-30. Your Body mass index is 26.59 kg/m. If this is out of the aforementioned range listed, please consider follow up with your Primary Care Provider.  If you are age 6 or younger, your body mass index should be between 19-25. Your Body mass index is 26.59 kg/m. If this is out of the aformentioned range listed, please consider follow up with your Primary Care Provider.   ________________________________________________________  The [facility] providers would like to encourage you to use [facility] to communicate with providers for non-urgent requests or questions.  Due to long hold times on the telephone, sending your provider a message by [facility] may be a faster and more efficient way to get a response.  Please allow 48 business hours for a response.  Please remember that this is for non-urgent requests.  _______________________________________________________  We have sent the following medications to your pharmacy for you to pick up at your convenience: Protonix  You have been scheduled for an endoscopy. Please follow written instructions given to you at your visit today. If you use inhalers (even only as needed), please bring them with you on the day of your procedure.  Please call with any questions or concerns.  Thank you,  Dr. Jackquline Denmark

## 2021-09-19 NOTE — Progress Notes (Signed)
Chief Complaint: FU  Referring Provider:  Mateo Flow, MD      ASSESSMENT AND PLAN;   #1. GERD with eso dysphagia d/t Schatzki's ring s/p dil 50Fr 11/2018 with complete resolution. #2. Epi pain/NCCP-also with costochondritis and cervical/thoracic spondylarthritis. Neg CTA chest/Abdo/pelvis 05/2021, neg cardiac Stress test 05/2021, neg [place] 10/2019. Nl CBC, CMP #3. Constipation -likely exacerbated by medications like amlodipine (better). #4. FH colon cancer (brother with stage IV colon cancer at age 58). Neg colon 11/2018 except for mod sig div.  No need to repeat unless new problems.  Plan:  - Protonix 40mg  po QD - EGD with dil - Biofreeze PRN for costochondritis/musculoskeletal component.   Proceed with EGD. I have discussed the risks and benefits. The risks including rare risk of perforation, bleeding, missed UGI neoplasms, risks of anesthesia/sedation. Alternatives were given. Patient is aware and agrees to proceed. All the questions were answered. This will be scheduled in upcoming days. Consent forms were given for review.  HPI:    Sheila KEEVEN is a 81 y.o. female   FU ED to [facility] 05/2021 For atypical chest pains. Neg CTA chest/abdo/pelvis, neg cardiac stress test Dx with costochronditis.  Again came to ED 08/06/2021 with atypical chest pains.  Has seen Dr. Benard Halsted cardiac.  GI work-up advised.  She has occasional heartburn with intermittent dysphagia.  Chest pains may or may not be related to food intake.  She is compliant with Protonix.  Previous EGD with dilation did help with dysphagia and chest pains.  On linzess 85mcg prn for constipation-better.  No melena or hematochezia.   From previous notes  Adm to [facility] (adm 2/4-10/14/2019) d/t chest pain/epigastric pain. Negative Lexiscan stress test (EF 64%), neg RUQ [place]. Nl CBC, CMP and lipase.  2020- ED at [facility] with chest pains-found to have mild pneumonia, treated with Levaquin.  Subsequent CT chest on  02/11/2019 showed complete resolution of previous pulmonary abnormalities but showed new scattered groundglass opacities measuring 7 mm in the right lung.  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   Past GI procedures: -Colonoscopy 11/2018 ([facility])-moderate sigmoid diverticulosis.  6 mm polyp status post polypectomy. Bx- neg. no need to repeat unless problems. -EGD 11/2018 Schatzki's ring status post esophageal dilatation 50 Fr, small hiatal hernia, few duodenal erosions.  Negative biopsies for HP. -CT Abdo/pelvis with contrast: 11/11/2018-neg except for DJD thoracolumbar spine, calcified uterine fibroids, sigmoid div without diverticulitis. - CT chest 02/11/2019: 3.6 cm ascending thoracic aortic aneurysm, 3.7 cm descending thoracic aortic aneurysm, coronary calcification.  Being followed by Dr. Chancy Milroy and Dr. Geraldo Pitter. 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[place]) 05/18/2020   Coronary artery disease involving native coronary artery of native heart without angina pectoris 10/10/2015   Depression    Dyslipidemia 10/10/2015   Dyspnea 11/21/2019   Essential hypertension 08/28/2016   GERD (gastroesophageal reflux disease)    Glaucoma    Headache above the eye region 11/10/2017   Heart murmur    History of right mastoidectomy 06/29/2020   Hyperlipidemia    Hypertension    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     EYE SURGERY     LEFT HEART CATH AND CORONARY ANGIOGRAPHY N/A 12/28/2017   Procedure: LEFT HEART CATH AND CORONARY ANGIOGRAPHY;  Surgeon: Belva Crome, MD;  Location: [facility];  Service: Cardiovascular;  Laterality: N/A;   MASTOIDECTOMY     PATENT DUCTUS ARTERIOUS REPAIR     SHOULDER OPEN ROTATOR CUFF REPAIR Bilateral     Family History  Problem Relation Age of Onset   Heart attack Mother    Heart attack Sister    Colon cancer Brother    Diabetes Brother    Diabetes Daughter    Stomach cancer Sister    Esophageal cancer Neg Hx     Social History   Tobacco Use   Smoking status: Never   Smokeless tobacco: Never  Vaping Use   Vaping Use: Never used  Substance Use Topics   Alcohol use: No   Drug use: Never    Current Outpatient Medications  Medication Sig Dispense Refill   albuterol (VENTOLIN HFA) 108 (90 Base) MCG/ACT inhaler Inhale 2 puffs into the lungs every 6 (six) hours as needed for wheezing or shortness of breath.     ALPRAZolam (XANAX) 0.25 MG tablet Take 0.25 mg by mouth 2 (two) times daily as needed for anxiety.     amLODipine (NORVASC) 2.5 MG tablet Take 1 tablet by mouth daily.     aspirin EC 81 MG tablet Take 81 mg by mouth daily.     carvedilol (COREG) 6.25 MG tablet Take 6.25 mg by mouth 2 (two) times daily with a meal.     Coenzyme Q10 (CO Q-10) 200 MG CAPS Take 1 capsule by mouth daily.      dorzolamide-timolol (COSOPT) 22.3-6.8 MG/ML ophthalmic solution Place 1 drop into both eyes 2 (two) times daily.     FLOVENT HFA 110 MCG/ACT inhaler Inhale 1 puff into the lungs 2 (two) times daily.     fluticasone (FLONASE) 50 MCG/ACT nasal spray Place 2 sprays into both nostrils daily.     furosemide (LASIX) 40 MG tablet Take 20 mg by mouth daily.     hydrALAZINE (APRESOLINE) 25 MG tablet Take 12.5 mg by mouth as needed (high blood pressure).     isosorbide mononitrate (IMDUR) 30 MG 24 hr tablet TAKE ONE TABLET BY MOUTH EVERY MORNING 90 tablet 1    linaclotide (LINZESS) 72 MCG capsule Take 72 mcg by mouth daily.     Multiple Vitamin (MULTIVITAMIN) tablet Take 1 tablet by mouth daily.     nitroGLYCERIN (NITROSTAT) 0.4 MG SL tablet DISSOLVE 1 TABLET UNDER THE TONGUE EVERY 5 MINUTES AS NEEDED FOR CHEST PAIN. DO NOT EXCEED A TOTAL OF 3 DOSES IN 15 MINUTES. 25 tablet 11   pantoprazole (PROTONIX) 40 MG tablet Take 1 tablet (40 mg total) by mouth daily. Please call [phone] to schedule an appointment for more refills 90 tablet 0   potassium chloride (MICRO-K) 10 MEQ CR capsule Take 10 mEq by mouth every morning.     rosuvastatin (CRESTOR) 10 MG tablet Take 10 mg by mouth daily.     traMADol-acetaminophen (ULTRACET) 37.5-325 MG tablet Take 1-2 tablets by mouth 2 (two) times daily as needed for pain.  No current facility-administered medications for this visit.    Allergies  Allergen Reactions   Antihistamines, Chlorpheniramine-Type Anaphylaxis   Ativan [Lorazepam] Swelling   Ceftin [Cefuroxime Axetil]    Meloxicam    Pheneen [Benzalkonium Chloride]    Morphine Palpitations    Review of Systems:  neg     Physical Exam:    BP (!) 160/78    Pulse 65    Ht 5' 3.6" (1.615 m)    Wt 153 lb (69.4 kg)    SpO2 98%    BMI 26.59 kg/m  Filed Weights   09/19/21 1432  Weight: 153 lb (69.4 kg)   televisit  Data Reviewed: I have personally reviewed following labs and imaging studies  CBC: CBC Latest Ref Rng & Units 12/28/2017  Hemoglobin 12.0 - 15.0 g/dL 13.3  Hematocrit 36.0 - 46.0 % 39.0    CMP: CMP Latest Ref Rng & Units 02/21/2020 11/09/2018 02/04/2018  Glucose 65 - 99 mg/dL 84 130(H) 104(H)  BUN 8 - 27 mg/dL 11 15 16   Creatinine 0.57 - 1.00 mg/dL 0.77 0.69 0.67  Sodium 134 - 144 mmol/L 141 140 141  Potassium 3.5 - 5.2 mmol/L 4.2 4.0 4.2  Chloride 96 - 106 mmol/L 102 102 103  CO2 20 - 29 mmol/L 22 30 24   Calcium 8.7 - 10.3 mg/dL 9.5 9.3 9.6  Total Protein 6.0 - 8.3 g/dL - 6.6 -  Total Bilirubin 0.2 - 1.2 mg/dL - 0.4 -   Alkaline Phos 39 - 117 U/L - 84 -  AST 0 - 37 U/L - 11 -  ALT 0 - 35 U/L - 10 -   This service was provided via telemedicine. Video-doxy failed.  The patient was located at home.  The provider was located in office.  The patient did consent to this telephone visit and is aware of possible charges through their insurance for this visit. The other persons participating in this telemedicine service were grandson and their role was coordination of care.  Time spent on call/review of records 25 min  Carmell Austria, MD 09/19/2021, 2:38 PM  Cc: Mateo Flow, MD

## 2021-09-26 DIAGNOSIS — Z8669 Personal history of other diseases of the nervous system and sense organs: Secondary | ICD-10-CM | POA: Diagnosis not present

## 2021-09-26 DIAGNOSIS — Z09 Encounter for follow-up examination after completed treatment for conditions other than malignant neoplasm: Secondary | ICD-10-CM | POA: Diagnosis not present

## 2021-09-27 DIAGNOSIS — R079 Chest pain, unspecified: Secondary | ICD-10-CM | POA: Diagnosis not present

## 2021-09-27 DIAGNOSIS — M549 Dorsalgia, unspecified: Secondary | ICD-10-CM | POA: Diagnosis not present

## 2021-09-27 DIAGNOSIS — R519 Headache, unspecified: Secondary | ICD-10-CM | POA: Diagnosis not present

## 2021-09-30 DIAGNOSIS — Z1331 Encounter for screening for depression: Secondary | ICD-10-CM | POA: Diagnosis not present

## 2021-09-30 DIAGNOSIS — Z6827 Body mass index (BMI) 27.0-27.9, adult: Secondary | ICD-10-CM | POA: Diagnosis not present

## 2021-09-30 DIAGNOSIS — M25512 Pain in left shoulder: Secondary | ICD-10-CM | POA: Diagnosis not present

## 2021-09-30 DIAGNOSIS — K219 Gastro-esophageal reflux disease without esophagitis: Secondary | ICD-10-CM | POA: Diagnosis not present

## 2021-09-30 DIAGNOSIS — Z9181 History of falling: Secondary | ICD-10-CM | POA: Diagnosis not present

## 2021-10-02 ENCOUNTER — Telehealth: Payer: Self-pay | Admitting: Cardiology

## 2021-10-02 NOTE — Telephone Encounter (Signed)
° °  Pt c/o BP issue: STAT if pt c/o blurred vision, one-sided weakness or slurred speech  1. What are your last 5 BP readings?  178/68 HR 57 168/68 HR 57 164/71 HR 56  2. Are you having any other symptoms (ex. Dizziness, headache, blurred vision, passed out)? lightheaded  3. What is your BP issue? Patient took the medication as advised when her BP goes above 160  Patient said she just feels lightheaded and has some shoulder pain. She put some cream on it and it helped. She also has some gas/ pressure after she eats. After she takes Tums or Gas-x and relieves the gas it feels better

## 2021-10-02 NOTE — Telephone Encounter (Signed)
Called patient to get more information. Patient stated she had taken her blood pressure before she had taken her blood pressure medication. Instructed patient to take her blood pressure one hour after taking her blood pressure medication. This would give the blood pressure medication time to work and lower her blood pressure. Patient stated that she had taken her medication earlier and her symptoms were better than when she first woke up today.Patient agreeable to this plan and had no further questions.

## 2021-10-02 NOTE — Telephone Encounter (Signed)
Patient also instructed to call back if her blood pressure or symptoms worsen.

## 2021-10-03 DIAGNOSIS — M25512 Pain in left shoulder: Secondary | ICD-10-CM | POA: Diagnosis not present

## 2021-10-03 DIAGNOSIS — Z8679 Personal history of other diseases of the circulatory system: Secondary | ICD-10-CM | POA: Diagnosis not present

## 2021-10-03 DIAGNOSIS — M549 Dorsalgia, unspecified: Secondary | ICD-10-CM | POA: Diagnosis not present

## 2021-10-09 DIAGNOSIS — Z6828 Body mass index (BMI) 28.0-28.9, adult: Secondary | ICD-10-CM | POA: Diagnosis not present

## 2021-10-09 DIAGNOSIS — Z1331 Encounter for screening for depression: Secondary | ICD-10-CM | POA: Diagnosis not present

## 2021-10-09 DIAGNOSIS — E118 Type 2 diabetes mellitus with unspecified complications: Secondary | ICD-10-CM | POA: Diagnosis not present

## 2021-10-15 ENCOUNTER — Encounter: Payer: Self-pay | Admitting: Gastroenterology

## 2021-10-15 ENCOUNTER — Ambulatory Visit (AMBULATORY_SURGERY_CENTER): Payer: PPO | Admitting: Gastroenterology

## 2021-10-15 VITALS — BP 150/70 | HR 62 | Temp 98.6°F | Resp 20 | Ht 63.5 in | Wt 153.0 lb

## 2021-10-15 DIAGNOSIS — K222 Esophageal obstruction: Secondary | ICD-10-CM | POA: Diagnosis not present

## 2021-10-15 DIAGNOSIS — R1310 Dysphagia, unspecified: Secondary | ICD-10-CM | POA: Diagnosis not present

## 2021-10-15 DIAGNOSIS — R1319 Other dysphagia: Secondary | ICD-10-CM | POA: Diagnosis not present

## 2021-10-15 DIAGNOSIS — K219 Gastro-esophageal reflux disease without esophagitis: Secondary | ICD-10-CM | POA: Diagnosis not present

## 2021-10-15 DIAGNOSIS — R1013 Epigastric pain: Secondary | ICD-10-CM | POA: Diagnosis not present

## 2021-10-15 DIAGNOSIS — K317 Polyp of stomach and duodenum: Secondary | ICD-10-CM | POA: Diagnosis not present

## 2021-10-15 DIAGNOSIS — K2970 Gastritis, unspecified, without bleeding: Secondary | ICD-10-CM

## 2021-10-15 DIAGNOSIS — K449 Diaphragmatic hernia without obstruction or gangrene: Secondary | ICD-10-CM | POA: Diagnosis not present

## 2021-10-15 DIAGNOSIS — I10 Essential (primary) hypertension: Secondary | ICD-10-CM | POA: Diagnosis not present

## 2021-10-15 DIAGNOSIS — K30 Functional dyspepsia: Secondary | ICD-10-CM

## 2021-10-15 DIAGNOSIS — I2510 Atherosclerotic heart disease of native coronary artery without angina pectoris: Secondary | ICD-10-CM | POA: Diagnosis not present

## 2021-10-15 DIAGNOSIS — K2950 Unspecified chronic gastritis without bleeding: Secondary | ICD-10-CM | POA: Diagnosis not present

## 2021-10-15 HISTORY — PX: UPPER GASTROINTESTINAL ENDOSCOPY: SHX188

## 2021-10-15 MED ORDER — SODIUM CHLORIDE 0.9 % IV SOLN: 500.0000 mL | Freq: Once | INTRAVENOUS | Status: DC

## 2021-10-15 NOTE — Progress Notes (Signed)
Pt's states no medical or surgical changes since previsit or office visit.  VS CW  

## 2021-10-15 NOTE — Progress Notes (Signed)
Called to room to assist during endoscopic procedure.  Patient ID and intended procedure confirmed with present staff. Received instructions for my participation in the procedure from the performing physician.  

## 2021-10-15 NOTE — Patient Instructions (Signed)
Please read handouts provided. Continue present medications. Dilation Diet. Await pathology results.   YOU HAD AN ENDOSCOPIC PROCEDURE TODAY AT [facility]:   Refer to the procedure report that was given to you for any specific questions about what was found during the examination.  If the procedure report does not answer your questions, please call your gastroenterologist to clarify.  If you requested that your care partner not be given the details of your procedure findings, then the procedure report has been included in a sealed envelope for you to review at your convenience later.  YOU SHOULD EXPECT: Some feelings of bloating in the abdomen. Passage of more gas than usual.  Walking can help get rid of the air that was put into your GI tract during the procedure and reduce the bloating. If you had a lower endoscopy (such as a colonoscopy or flexible sigmoidoscopy) you may notice spotting of blood in your stool or on the toilet paper. If you underwent a bowel prep for your procedure, you may not have a normal bowel movement for a few days.  Please Note:  You might notice some irritation and congestion in your nose or some drainage.  This is from the oxygen used during your procedure.  There is no need for concern and it should clear up in a day or so.  SYMPTOMS TO REPORT IMMEDIATELY:    Following upper endoscopy (EGD)  Vomiting of blood or coffee ground material  New chest pain or pain under the shoulder blades  Painful or persistently difficult swallowing  New shortness of breath  Fever of 100F or higher  Black, tarry-looking stools  For urgent or emergent issues, a gastroenterologist can be reached at any hour by calling [phone]. Do not use MyChart messaging for urgent concerns.    DIET:  Drink plenty of fluids but you should avoid alcoholic beverages for 24 hours.  ACTIVITY:  You should plan to take it easy for the rest of today and you should NOT DRIVE or  use heavy machinery until tomorrow (because of the sedation medicines used during the test).    FOLLOW UP: Our staff will call the number listed on your records 48-72 hours following your procedure to check on you and address any questions or concerns that you may have regarding the information given to you following your procedure. If we do not reach you, we will leave a message.  We will attempt to reach you two times.  During this call, we will ask if you have developed any symptoms of COVID 19. If you develop any symptoms (ie: fever, flu-like symptoms, shortness of breath, cough etc.) before then, please call [phone].  If you test positive for Covid 19 in the 2 weeks post procedure, please call and report this information to [place].    If any biopsies were taken you will be contacted by phone or by letter within the next 1-3 weeks.  Please call us at [phone] if you have not heard about the biopsies in 3 weeks.    SIGNATURES/CONFIDENTIALITY: You and/or your care partner have signed paperwork which will be entered into your electronic medical record.  These signatures attest to the fact that that the information above on your After Visit Summary has been reviewed and is understood.  Full responsibility of the confidentiality of this discharge information lies with you and/or your care-partner.

## 2021-10-15 NOTE — Progress Notes (Signed)
BP low for my liking in immediate PACU.  HOB dropped while I drew up ephedrine.  I gave her 2 5mg  doses with BPs taken in between.  See chart for numbers

## 2021-10-15 NOTE — Op Note (Signed)
[place] Patient Name: Sheila [name] Procedure Date: 10/15/2021 2:53 PM MRN: [id number] Endoscopist: Jackquline Denmark , MD Age: 81 Referring MD:  Date of Birth: 1941/06/15 Gender: Female Account #: [id number] Procedure:                Upper GI endoscopy Indications:              Dysphagia Medicines:                Monitored Anesthesia Care Procedure:                Pre-Anesthesia Assessment:                           - Prior to the procedure, a History and Physical                            was performed, and patient medications and                            allergies were reviewed. The patient's tolerance of                            previous anesthesia was also reviewed. The risks                            and benefits of the procedure and the sedation                            options and risks were discussed with the patient.                            All questions were answered, and informed consent                            was obtained. Prior Anticoagulants: The patient has                            taken no previous anticoagulant or antiplatelet                            agents. ASA Grade Assessment: II - A patient with                            mild systemic disease. After reviewing the risks                            and benefits, the patient was deemed in                            satisfactory condition to undergo the procedure.                           After obtaining informed consent, the endoscope was  passed under direct vision. Throughout the                            procedure, the patient's blood pressure, pulse, and                            oxygen saturations were monitored continuously. The                            Endoscope was introduced through the mouth, and                            advanced to the second part of duodenum. The upper                            GI endoscopy was accomplished without difficulty.                             The patient tolerated the procedure well. Scope In: Scope Out: Findings:                 A moderate Schatzki ring was found at the                            gastroesophageal junction, 36 cm from the incisors                            with diameters proximately 14 mm. Biopsies were                            obtained from the proximal and distal esophagus                            with cold forceps for histology to r/o eosinophilic                            esophagitis. The scope was withdrawn. Dilation was                            performed with a Maloney dilator with mild                            resistance at 50 Fr and 52 Fr.                           A small hiatal hernia was present.                           Multiple (10-15) 2 to 4 mm sessile polyps with no                            bleeding and no stigmata of recent bleeding were  found in the gastric fundus and in the gastric                            body. Three polyps were removed with a cold biopsy                            forceps. Resection and retrieval were complete. We                            did obtain multiple biopsies from the antrum/body                            to rule out H. pylori.                           The examined duodenum was normal. Complications:            No immediate complications. Estimated Blood Loss:     Estimated blood loss: none. Impression:               - Moderate Schatzki ring. Biopsied. Dilated.                           - Small hiatal hernia.                           - Multiple gastric polyps. (Resected and retrieved                            x 3) Recommendation:           - Patient has a contact number available for                            emergencies. The signs and symptoms of potential                            delayed complications were discussed with the                            patient. Return to normal  activities tomorrow.                            Written discharge instructions were provided to the                            patient.                           - Post dilatation diet.                           - Continue present medications.                           - Await pathology results.                           -  The findings and recommendations were discussed                            with the designated responsible adult. Jackquline Denmark, MD 10/15/2021 3:26:34 PM This report has been signed electronically.

## 2021-10-15 NOTE — Progress Notes (Signed)
Chief Complaint: FU  Referring Provider:  Mateo Flow, MD      ASSESSMENT AND PLAN;   #1. GERD with eso dysphagia d/t Schatzki's ring s/p dil 50Fr 11/2018 with complete resolution. #2. Epi pain/NCCP-also with costochondritis and cervical/thoracic spondylarthritis. Neg CTA chest/Abdo/pelvis 05/2021, neg cardiac Stress test 05/2021, neg [place] 10/2019. Nl CBC, CMP #3. Constipation -likely exacerbated by medications like amlodipine (better). #4. FH colon cancer (brother with stage IV colon cancer at age 26). Neg colon 11/2018 except for mod sig div.  No need to repeat unless new problems.  Plan:  - Protonix 40mg  po QD - EGD with dil - Biofreeze PRN for costochondritis/musculoskeletal component.   Proceed with EGD. I have discussed the risks and benefits. The risks including rare risk of perforation, bleeding, missed UGI neoplasms, risks of anesthesia/sedation. Alternatives were given. Patient is aware and agrees to proceed. All the questions were answered. This will be scheduled in upcoming days. Consent forms were given for review.  HPI:    Sheila [name] is a 81 y.o. female   FU ED to [facility] 05/2021 For atypical chest pains. Neg CTA chest/abdo/pelvis, neg cardiac stress test Dx with costochronditis.  Again came to ED 08/06/2021 with atypical chest pains.  Has seen Dr. Benard Halsted cardiac.  GI work-up advised.  She has occasional heartburn with intermittent dysphagia.  Chest pains may or may not be related to food intake.  She is compliant with Protonix.  Previous EGD with dilation did help with dysphagia and chest pains.  On linzess 42mcg prn for constipation-better.  No melena or hematochezia.   From previous notes  Adm to [facility] (adm 2/4-10/14/2019) d/t chest pain/epigastric pain. Negative Lexiscan stress test (EF 64%), neg RUQ [place]. Nl CBC, CMP and lipase.  2020- ED at [facility] with chest pains-found to have mild pneumonia, treated with Levaquin.  Subsequent CT chest on  02/11/2019 showed complete resolution of previous pulmonary abnormalities but showed new scattered groundglass opacities measuring 7 mm in the right lung.  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   Past GI procedures: -Colonoscopy 11/2018 ([facility])-moderate sigmoid diverticulosis.  6 mm polyp status post polypectomy. Bx- neg. no need to repeat unless problems. -EGD 11/2018 Schatzki's ring status post esophageal dilatation 50 Fr, small hiatal hernia, few duodenal erosions.  Negative biopsies for HP. -CT Abdo/pelvis with contrast: 11/11/2018-neg except for DJD thoracolumbar spine, calcified uterine fibroids, sigmoid div without diverticulitis. - CT chest 02/11/2019: 3.6 cm ascending thoracic aortic aneurysm, 3.7 cm descending thoracic aortic aneurysm, coronary calcification.  Being followed by Dr. Chancy Milroy and Dr. Geraldo Pitter. 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[place]) 05/18/2020   Coronary artery disease involving native coronary artery of native heart without angina pectoris 10/10/2015   Depression    Dyslipidemia 10/10/2015   Dyspnea 11/21/2019   Essential hypertension 08/28/2016   GERD (gastroesophageal reflux disease)    Glaucoma    Headache above the eye region 11/10/2017   Heart murmur    History of right mastoidectomy 06/29/2020   Hyperlipidemia    Hypertension    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     EYE SURGERY     LEFT HEART CATH AND CORONARY ANGIOGRAPHY N/A 12/28/2017   Procedure: LEFT HEART CATH AND CORONARY ANGIOGRAPHY;  Surgeon: Belva Crome, MD;  Location: [facility];  Service: Cardiovascular;  Laterality: N/A;   MASTOIDECTOMY     PATENT DUCTUS ARTERIOUS REPAIR     SHOULDER OPEN ROTATOR CUFF REPAIR Bilateral    UPPER GASTROINTESTINAL ENDOSCOPY  10/15/2021    Family History  Problem Relation Age of Onset   Heart attack Mother    Heart attack Sister    Colon cancer Brother    Diabetes Brother    Diabetes Daughter    Stomach cancer Sister    Esophageal cancer Neg Hx     Social History   Tobacco Use   Smoking status: Never   Smokeless tobacco: Never  Vaping Use   Vaping Use: Never used  Substance Use Topics   Alcohol use: No   Drug use: Never    Current Outpatient Medications  Medication Sig Dispense Refill   albuterol (VENTOLIN HFA) 108 (90 Base) MCG/ACT inhaler Inhale 2 puffs into the lungs every 6 (six) hours as needed for wheezing or shortness of breath.     ALPRAZolam (XANAX) 0.25 MG tablet Take 0.25 mg by mouth 2 (two) times daily as needed for anxiety.     amLODipine (NORVASC) 2.5 MG tablet Take 1 tablet by mouth daily.     aspirin EC 81 MG tablet Take 81 mg by mouth daily.     carvedilol (COREG) 6.25 MG tablet Take 6.25 mg by mouth 2 (two) times daily with a meal.     Coenzyme Q10 (CO Q-10) 200 MG CAPS Take 1 capsule by mouth daily.      dorzolamide-timolol (COSOPT) 22.3-6.8 MG/ML ophthalmic solution Place 1 drop into both eyes 2 (two) times daily.     FLOVENT HFA 110 MCG/ACT inhaler Inhale 1 puff into the lungs 2 (two) times daily.     fluticasone (FLONASE) 50 MCG/ACT nasal spray Place 2 sprays into both nostrils daily.     furosemide (LASIX) 40 MG tablet Take 20 mg by mouth daily.     hydrALAZINE (APRESOLINE) 25 MG tablet Take 12.5 mg by mouth as needed (high blood pressure).     isosorbide mononitrate (IMDUR) 30 MG 24 hr tablet TAKE ONE  TABLET BY MOUTH EVERY MORNING 90 tablet 1   linaclotide (LINZESS) 72 MCG capsule Take 72 mcg by mouth daily.     Multiple Vitamin (MULTIVITAMIN) tablet Take 1 tablet by mouth daily.     nitroGLYCERIN (NITROSTAT) 0.4 MG SL tablet DISSOLVE 1 TABLET UNDER THE TONGUE EVERY 5 MINUTES AS NEEDED FOR CHEST PAIN. DO NOT EXCEED A TOTAL OF 3 DOSES IN 15 MINUTES. 25 tablet 11   pantoprazole (PROTONIX) 40 MG tablet Take 1 tablet (40 mg total) by mouth daily. 90 tablet 4   potassium chloride (MICRO-K) 10 MEQ CR capsule Take 10 mEq by mouth every morning.     rosuvastatin (CRESTOR) 10 MG tablet Take 10 mg by mouth daily.     traMADol-acetaminophen (ULTRACET) 37.5-325 MG tablet Take 1-2 tablets by mouth 2 (two) times daily as needed for pain.     No current  facility-administered medications for this visit.    Allergies  Allergen Reactions   Antihistamines, Chlorpheniramine-Type Anaphylaxis   Ativan [Lorazepam] Swelling   Ceftin [Cefuroxime Axetil]    Meloxicam    Pheneen [Benzalkonium Chloride]    Morphine Palpitations    Review of Systems:  neg     Physical Exam:    BP (!) 164/72    Pulse 60    Temp 98.6 F (37 C) (Temporal)    Ht 5' 3.5" (1.613 m)    Wt 153 lb (69.4 kg)    SpO2 98%    BMI 26.68 kg/m  Filed Weights   10/15/21 1449  Weight: 153 lb (69.4 kg)   Gen: awake, alert, NAD HEENT: anicteric, no pallor CV: RRR, no mrg Pulm: CTA b/l Abd: soft, NT/ND, +BS throughout Ext: no c/c/e Neuro: nonfocal   Data Reviewed: I have personally reviewed following labs and imaging studies  CBC: CBC Latest Ref Rng & Units 12/28/2017  Hemoglobin 12.0 - 15.0 g/dL 13.3  Hematocrit 36.0 - 46.0 % 39.0    CMP: CMP Latest Ref Rng & Units 02/21/2020 11/09/2018 02/04/2018  Glucose 65 - 99 mg/dL 84 130(H) 104(H)  BUN 8 - 27 mg/dL 11 15 16   Creatinine 0.57 - 1.00 mg/dL 0.77 0.69 0.67  Sodium 134 - 144 mmol/L 141 140 141  Potassium 3.5 - 5.2 mmol/L 4.2 4.0 4.2  Chloride 96 - 106 mmol/L 102 102 103   CO2 20 - 29 mmol/L 22 30 24   Calcium 8.7 - 10.3 mg/dL 9.5 9.3 9.6  Total Protein 6.0 - 8.3 g/dL - 6.6 -  Total Bilirubin 0.2 - 1.2 mg/dL - 0.4 -  Alkaline Phos 39 - 117 U/L - 84 -  AST 0 - 37 U/L - 11 -  ALT 0 - 35 U/L - 10 -     Carmell Austria, MD 10/15/2021, 2:57 PM  Cc: Mateo Flow, MD

## 2021-10-17 ENCOUNTER — Telehealth: Payer: Self-pay

## 2021-10-17 NOTE — Telephone Encounter (Signed)
°  Follow up Call-  Call back number 10/15/2021  Post procedure Call Back phone  # [phone]  Permission to leave phone message Yes  Some recent data might be hidden     Patient questions:  Do you have a fever, pain , or abdominal swelling? No. Pain Score  0 *  Have you tolerated food without any problems? Yes.    Have you been able to return to your normal activities? Yes.    Do you have any questions about your discharge instructions: Diet   No. Medications  No. Follow up visit  No.  Do you have questions or concerns about your Care? No.  Actions: * If pain score is 4 or above: No action needed, pain <4.

## 2021-10-19 ENCOUNTER — Encounter: Payer: Self-pay | Admitting: Gastroenterology

## 2021-10-22 DIAGNOSIS — M19012 Primary osteoarthritis, left shoulder: Secondary | ICD-10-CM | POA: Diagnosis not present

## 2021-10-25 DIAGNOSIS — Z961 Presence of intraocular lens: Secondary | ICD-10-CM | POA: Diagnosis not present

## 2021-10-25 DIAGNOSIS — H1820 Unspecified corneal edema: Secondary | ICD-10-CM | POA: Diagnosis not present

## 2021-10-25 DIAGNOSIS — H402232 Chronic angle-closure glaucoma, bilateral, moderate stage: Secondary | ICD-10-CM | POA: Diagnosis not present

## 2021-10-28 DIAGNOSIS — M25512 Pain in left shoulder: Secondary | ICD-10-CM | POA: Diagnosis not present

## 2021-10-28 DIAGNOSIS — M25511 Pain in right shoulder: Secondary | ICD-10-CM | POA: Diagnosis not present

## 2021-10-30 ENCOUNTER — Telehealth: Payer: Self-pay | Admitting: Gastroenterology

## 2021-10-30 NOTE — Telephone Encounter (Signed)
Inbound call from patient requesting a call from a nurse please in regards to abdominal pain she is experiencing.

## 2021-10-31 DIAGNOSIS — M25512 Pain in left shoulder: Secondary | ICD-10-CM | POA: Diagnosis not present

## 2021-10-31 DIAGNOSIS — M25412 Effusion, left shoulder: Secondary | ICD-10-CM | POA: Diagnosis not present

## 2021-10-31 DIAGNOSIS — M19012 Primary osteoarthritis, left shoulder: Secondary | ICD-10-CM | POA: Diagnosis not present

## 2021-10-31 DIAGNOSIS — S46012A Strain of muscle(s) and tendon(s) of the rotator cuff of left shoulder, initial encounter: Secondary | ICD-10-CM | POA: Diagnosis not present

## 2021-10-31 NOTE — Telephone Encounter (Signed)
Pt stated that she is having  left side abdominal pain. Pt stated that she has been experiencing this for a month or so and that it mostly hurts at night when she is in bed and turns over. Pt stated that she also has an MRI of the Left shoulder today. Pt stated that she does feel her swallowing is better after her recent EGD with dilation. Pt was made aware of the pathology from letter that was created by Dr. Lyndel Safe. Letter to be mailed to pt: Pt made aware  Pt was encouraged to reach out to her PCP as the symptoms that she described seemed more muscle-skeletal. Pt states that after she eats the pain does NOT intensify any. Pt is having BM. Pt states that it is mostly when she turns and turns over in Bed.  Pt verbalized understanding with all questions answered.

## 2021-11-04 DIAGNOSIS — M25511 Pain in right shoulder: Secondary | ICD-10-CM | POA: Diagnosis not present

## 2021-11-04 DIAGNOSIS — M25512 Pain in left shoulder: Secondary | ICD-10-CM | POA: Diagnosis not present

## 2021-11-05 DIAGNOSIS — I10 Essential (primary) hypertension: Secondary | ICD-10-CM | POA: Diagnosis not present

## 2021-11-05 DIAGNOSIS — E782 Mixed hyperlipidemia: Secondary | ICD-10-CM | POA: Diagnosis not present

## 2021-11-05 DIAGNOSIS — K219 Gastro-esophageal reflux disease without esophagitis: Secondary | ICD-10-CM | POA: Diagnosis not present

## 2021-11-05 DIAGNOSIS — M19012 Primary osteoarthritis, left shoulder: Secondary | ICD-10-CM | POA: Diagnosis not present

## 2021-11-06 ENCOUNTER — Telehealth: Payer: Self-pay

## 2021-11-06 NOTE — Telephone Encounter (Signed)
? ?  Pre-operative Risk Assessment  ?  ?Patient Name: Sheila [name]  ?DOB: 1941/03/02 ?MRN: [id number]  ? ?  ? ?Request for Surgical Clearance   ? ?Procedure:   left reverse total shoulder replacement ? ?Date of Surgery:  Clearance TBD                              ?   ?Surgeon:  Dr. Joya Salm ?Surgeon's Group or Practice Name:  [facility] ?Phone number:  [phone] ?Fax number:  [phone] ?  ?Type of Clearance Requested:   ?- Medical  ?  ?Type of Anesthesia:  General  ?  ?Additional requests/questions:   ? ?Signed, ?Paeton Latouche Tressa Busman   ?11/06/2021, 2:52 PM  ? ? ? ? ? ?

## 2021-11-07 NOTE — Telephone Encounter (Signed)
Attempted to contact patient as part of preoperative protocol.  Mailbox is full and will not accept messages.  Patient needs call back.  8:55 AM on 11/07/2021 ?

## 2021-11-11 NOTE — Telephone Encounter (Signed)
Left message for the pt to call back for pre op appt. Pt will need a tele visit with Coletta Memos ?

## 2021-11-11 NOTE — Telephone Encounter (Signed)
Preoperative team, please contact this patient and set up a phone call appointment for further cardiac evaluation.  Thank you for your help. ? ?Jossie Ng. Tamikka Pilger NP-C ? ?  ?11/11/2021, 3:04 PM ?[facility] ?[address] ?Office [phone] Fax [phone] ? ?

## 2021-11-12 ENCOUNTER — Telehealth: Payer: Self-pay | Admitting: *Deleted

## 2021-11-12 NOTE — Telephone Encounter (Signed)
I s/w the pt and she is agreeable to plan of care for tele pre op visit with Coletta Memos, FNP @ 11 am. Med rec and consent have been done.  ?

## 2021-11-12 NOTE — Telephone Encounter (Signed)
?  Patient Consent for Virtual Visit  ? ? ?   ? ?Sheila [name] has provided verbal consent on 11/12/2021 for a virtual visit (video or telephone). ? ? ?CONSENT FOR VIRTUAL VISIT FOR:  Sheila [name]  ?By participating in this virtual visit I agree to the following: ? ?I hereby voluntarily request, consent and authorize [facility] and its employed or contracted physicians, physician assistants, nurse practitioners or other licensed health care professionals (the Practitioner), to provide me with telemedicine health care services (the ?Services") as deemed necessary by the treating Practitioner. I acknowledge and consent to receive the Services by the Practitioner via telemedicine. I understand that the telemedicine visit will involve communicating with the Practitioner through live audiovisual communication technology and the disclosure of certain medical information by electronic transmission. I acknowledge that I have been given the opportunity to request an in-person assessment or other available alternative prior to the telemedicine visit and am voluntarily participating in the telemedicine visit. ? ?I understand that I have the right to withhold or withdraw my consent to the use of telemedicine in the course of my care at any time, without affecting my right to future care or treatment, and that the Practitioner or I may terminate the telemedicine visit at any time. I understand that I have the right to inspect all information obtained and/or recorded in the course of the telemedicine visit and may receive copies of available information for a reasonable fee.  I understand that some of the potential risks of receiving the Services via telemedicine include:  ?Delay or interruption in medical evaluation due to technological equipment failure or disruption; ?Information transmitted may not be sufficient (e.g. poor resolution of images) to allow for appropriate medical decision making by the Practitioner;  and/or  ?In rare instances, security protocols could fail, causing a breach of personal health information. ? ?Furthermore, I acknowledge that it is my responsibility to provide information about my medical history, conditions and care that is complete and accurate to the best of my ability. I acknowledge that Practitioner's advice, recommendations, and/or decision may be based on factors not within their control, such as incomplete or inaccurate data provided by me or distortions of diagnostic images or specimens that may result from electronic transmissions. I understand that the practice of medicine is not an exact science and that Practitioner makes no warranties or guarantees regarding treatment outcomes. I acknowledge that a copy of this consent can be made available to me via my patient portal ([place]), or I can request a printed copy by calling the office of [facility].   ? ?I understand that my insurance will be billed for this visit.  ? ?I have read or had this consent read to me. ?I understand the contents of this consent, which adequately explains the benefits and risks of the Services being provided via telemedicine.  ?I have been provided ample opportunity to ask questions regarding this consent and the Services and have had my questions answered to my satisfaction. ?I give my informed consent for the services to be provided through the use of telemedicine in my medical care ? ? ? ?

## 2021-11-13 DIAGNOSIS — H1013 Acute atopic conjunctivitis, bilateral: Secondary | ICD-10-CM | POA: Diagnosis not present

## 2021-11-14 DIAGNOSIS — Z01818 Encounter for other preprocedural examination: Secondary | ICD-10-CM | POA: Diagnosis not present

## 2021-11-14 DIAGNOSIS — E559 Vitamin D deficiency, unspecified: Secondary | ICD-10-CM | POA: Diagnosis not present

## 2021-11-14 DIAGNOSIS — M79609 Pain in unspecified limb: Secondary | ICD-10-CM | POA: Diagnosis not present

## 2021-11-14 DIAGNOSIS — Z79899 Other long term (current) drug therapy: Secondary | ICD-10-CM | POA: Diagnosis not present

## 2021-11-19 ENCOUNTER — Other Ambulatory Visit: Payer: Self-pay

## 2021-11-19 ENCOUNTER — Ambulatory Visit (INDEPENDENT_AMBULATORY_CARE_PROVIDER_SITE_OTHER): Payer: PPO | Admitting: Physician Assistant

## 2021-11-19 DIAGNOSIS — Z20828 Contact with and (suspected) exposure to other viral communicable diseases: Secondary | ICD-10-CM | POA: Diagnosis not present

## 2021-11-19 DIAGNOSIS — Z01810 Encounter for preprocedural cardiovascular examination: Secondary | ICD-10-CM

## 2021-11-19 DIAGNOSIS — J069 Acute upper respiratory infection, unspecified: Secondary | ICD-10-CM | POA: Diagnosis not present

## 2021-11-19 NOTE — Progress Notes (Addendum)
? ?Virtual Visit via Telephone Note  ? ?This visit type was conducted due to national recommendations for restrictions regarding the COVID-19 Pandemic (e.g. social distancing) in an effort to limit this patient's exposure and mitigate transmission in our community.  Due to her co-morbid illnesses, this patient is at least at moderate risk for complications without adequate follow up.  This format is felt to be most appropriate for this patient at this time.  The patient did not have access to video technology/had technical difficulties with video requiring transitioning to audio format only (telephone).  All issues noted in this document were discussed and addressed.  No physical exam could be performed with this format.  Please refer to the patient's chart for her  consent to telehealth for [facility]. ?Evaluation Performed:  Preoperative cardiovascular risk assessment ? ?This visit type was conducted due to national recommendations for restrictions regarding the COVID-19 Pandemic (e.g. social distancing).  This format is felt to be most appropriate for this patient at this time.  All issues noted in this document were discussed and addressed.  No physical exam was performed (except for noted visual exam findings with Video Visits).  Please refer to the patient's chart (MyChart message for video visits and phone note for telephone visits) for the patient's consent to telehealth for [facility]. ?_____________  ? ?Date:  11/19/2021  ? ?Patient ID:  Sheila [name], Sheila [name] October 17, 1940, MRN [id number] ?Patient Location:  ?Home ?Provider location:   ?Office ? ?Primary Care Provider:  Mateo Flow, MD ?Primary Cardiologist:  None ? ?Chief Complaint  ?  ?81 y.o. y/o female with a h/o nonobstructive CAD, hypertension, hyperlipidemia, who is pending total shoulder replacement, and presents today for telephonic preoperative cardiovascular risk assessment. ? ?Past Medical History  ?  ?Past Medical History:  ?Diagnosis Date   ? Allergy   ? Anxiety   ? Arthritis   ? Asthma   ? Blood transfusion without reported diagnosis   ? Cataract   ? Chest pain 07/02/2018  ? Chronic headaches   ? Chronic infective otitis externa, 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  ? Chronic obstructive asthma ([place]) 05/18/2020  ? Coronary artery disease involving native coronary artery of native heart without angina pectoris 10/10/2015  ? Depression   ? Dyslipidemia 10/10/2015  ? Dyspnea 11/21/2019  ? Essential hypertension 08/28/2016  ? GERD (gastroesophageal reflux disease)   ? Glaucoma   ? Headache above the eye region 11/10/2017  ? Heart murmur   ? History of right mastoidectomy 06/29/2020  ? Hyperlipidemia   ? Hypertension   ? 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 Mild aortic stenosis 02/21/2021  ? Mixed conductive and sensorineural hearing loss of right ear with restricted hearing of left ear 06/29/2020  ? Status post craniectomy 06/29/2020  ? ?Past Surgical History:  ?Procedure Laterality Date  ? CESAREAN SECTION    ? COLONOSCOPY  05/16/2015  ? Moderate sigmoid diverticulosis.   ? EYE SURGERY    ? LEFT HEART CATH AND CORONARY ANGIOGRAPHY N/A 12/28/2017  ? Procedure: LEFT HEART CATH AND CORONARY ANGIOGRAPHY;  Surgeon: Belva Crome, MD;  Location: [facility];  Service: Cardiovascular;  Laterality: N/A;  ? MASTOIDECTOMY    ? PATENT DUCTUS ARTERIOUS REPAIR    ? SHOULDER OPEN ROTATOR CUFF REPAIR Bilateral   ? UPPER GASTROINTESTINAL ENDOSCOPY  10/15/2021  ? ? ?Allergies ? ?Allergies  ?Allergen Reactions  ? Antihistamines, Chlorpheniramine-Type Anaphylaxis  ? Ativan [Lorazepam] Swelling  ? Ceftin [Cefuroxime Axetil]   ? Meloxicam   ?  Pheneen [Benzalkonium Chloride]   ? Morphine Palpitations  ? ? ?History of Present Illness  ?  ?Sheila [name] is a 81 y.o. female who presents via audio/video conferencing for a telehealth visit today.  Pt was last seen in cardiology clinic on 08/16/2021, by Dr. Geraldo Pitter.  At that time Sheila [name] was doing well.  Previous cardiac catheterization on  12/28/2017 showed EF greater than 60%, 25 to 30% ostial LAD, minimal luminal irregularities in mid left circumflex, overall minimal coronary artery disease.  Last Myoview obtained at [facility] on 05/28/2021 was normal with EF 65%. she is now pending shoulder replacement.  Since his last visit, she has been doing well without significant chest pain worsening dyspnea.  In the past 2 days, she does have increased sneezing and coughing, it sounds like a viral symptom.  She is planning to see her PCP for this.  She reportedly went to [facility] for preadmission last Thursday and had a EKG performed at there.  The nurse at the [facility] had some question regarding the EKG.  I have called and requested the EKG to be faxed to [place] for review. ? ? ?Home Medications  ?  ?Prior to Admission medications   ?Medication Sig Start Date End Date Taking? Authorizing Provider  ?albuterol (VENTOLIN HFA) 108 (90 Base) MCG/ACT inhaler Inhale 2 puffs into the lungs every 6 (six) hours as needed for wheezing or shortness of breath. 07/24/21   [provider]  ?ALPRAZolam Duanne Moron) 0.25 MG tablet Take 0.25 mg by mouth 2 (two) times daily as needed for anxiety.    [provider]  ?amLODipine (NORVASC) 2.5 MG tablet Take 1 tablet by mouth daily. 02/02/20   [provider]  ?aspirin EC 81 MG tablet Take 81 mg by mouth daily.    [provider]  ?carvedilol (COREG) 6.25 MG tablet Take 6.25 mg by mouth 2 (two) times daily with a meal.    [provider]  ?Coenzyme Q10 (CO Q-10) 200 MG CAPS Take 1 capsule by mouth daily.    [provider]  ?dorzolamide-timolol (COSOPT) 22.3-6.8 MG/ML ophthalmic solution Place 1 drop into both eyes 2 (two) times daily. 07/19/21   [provider]  ?FLOVENT HFA 110 MCG/ACT inhaler Inhale 1 puff into the lungs 2 (two) times daily. 11/29/20   [provider]  ?fluticasone (FLONASE) 50 MCG/ACT nasal spray Place 2 sprays into both  nostrils daily. 11/29/20   [provider]  ?furosemide (LASIX) 40 MG tablet Take 20 mg by mouth daily.    [provider]  ?hydrALAZINE (APRESOLINE) 25 MG tablet Take 12.5 mg by mouth as needed (high blood pressure). 05/14/21   [provider]  ?isosorbide mononitrate (IMDUR) 30 MG 24 hr tablet TAKE ONE TABLET BY MOUTH EVERY MORNING 07/15/21   Revankar, Reita Cliche, MD  ?linaclotide (LINZESS) 72 MCG capsule Take 72 mcg by mouth daily.    [provider]  ?Multiple Vitamin (MULTIVITAMIN) tablet Take 1 tablet by mouth daily.    [provider]  ?nitroGLYCERIN (NITROSTAT) 0.4 MG SL tablet DISSOLVE 1 TABLET UNDER THE TONGUE EVERY 5 MINUTES AS NEEDED FOR CHEST PAIN. DO NOT EXCEED A TOTAL OF 3 DOSES IN 15 MINUTES. 07/23/21   Revankar, Reita Cliche, MD  ?pantoprazole (PROTONIX) 40 MG tablet Take 1 tablet (40 mg total) by mouth daily. 09/19/21   Jackquline Denmark, MD  ?potassium chloride (MICRO-K) 10 MEQ CR capsule Take 10 mEq by mouth every morning. 10/24/20  [provider]  ?rosuvastatin (CRESTOR) 10 MG tablet Take 10 mg by mouth daily.    [provider]  ?traMADol-acetaminophen (ULTRACET) 37.5-325 MG tablet Take 1-2 tablets by mouth 2 (two) times daily as needed for pain. 02/14/21   [provider]  ? ? ?Physical Exam  ?  ?Vital Signs:  Sheila [name] does not have vital signs available for review today. ? ?Given telephonic nature of communication, physical exam is limited. ?AAOx3. NAD. Normal affect.  Speech and respirations are unlabored. ? ?Accessory Clinical Findings  ?  ?None ? ?Assessment & Plan  ?  ?1.  Preoperative Cardiovascular Risk Assessment: ? -Patient previously had minimal disease on the cardiac catheterization in 2019.  Myoview obtained in 2022 was low risk.  She denies any recent chest pain or worsening dyspnea.  She did have some question regarding her EKG obtained at [facility] preadmission last Thursday, I have requested the EKG.  As long as the  EKG shows no significant change, she should be cleared for the procedure as a low risk candidate. ? -EKG received and compared to the previous EKG from November 2022 and also April 2019, her EKG is unc

## 2021-11-26 ENCOUNTER — Ambulatory Visit: Payer: PPO | Admitting: Cardiology

## 2021-11-26 DIAGNOSIS — Z974 Presence of external hearing-aid: Secondary | ICD-10-CM | POA: Diagnosis not present

## 2021-11-26 DIAGNOSIS — H6123 Impacted cerumen, bilateral: Secondary | ICD-10-CM | POA: Diagnosis not present

## 2021-11-26 DIAGNOSIS — J343 Hypertrophy of nasal turbinates: Secondary | ICD-10-CM | POA: Diagnosis not present

## 2021-11-26 DIAGNOSIS — H9193 Unspecified hearing loss, bilateral: Secondary | ICD-10-CM | POA: Diagnosis not present

## 2021-11-26 DIAGNOSIS — H61303 Acquired stenosis of external ear canal, unspecified, bilateral: Secondary | ICD-10-CM | POA: Diagnosis not present

## 2021-11-26 DIAGNOSIS — J309 Allergic rhinitis, unspecified: Secondary | ICD-10-CM | POA: Diagnosis not present

## 2021-11-29 DIAGNOSIS — L299 Pruritus, unspecified: Secondary | ICD-10-CM | POA: Diagnosis not present

## 2021-11-29 DIAGNOSIS — H01114 Allergic dermatitis of left upper eyelid: Secondary | ICD-10-CM | POA: Diagnosis not present

## 2021-11-29 DIAGNOSIS — H01111 Allergic dermatitis of right upper eyelid: Secondary | ICD-10-CM | POA: Diagnosis not present

## 2021-12-02 DIAGNOSIS — Z6827 Body mass index (BMI) 27.0-27.9, adult: Secondary | ICD-10-CM | POA: Diagnosis not present

## 2021-12-02 DIAGNOSIS — M940 Chondrocostal junction syndrome [Tietze]: Secondary | ICD-10-CM | POA: Diagnosis not present

## 2021-12-02 DIAGNOSIS — Z0000 Encounter for general adult medical examination without abnormal findings: Secondary | ICD-10-CM | POA: Diagnosis not present

## 2021-12-10 DIAGNOSIS — Z1231 Encounter for screening mammogram for malignant neoplasm of breast: Secondary | ICD-10-CM | POA: Diagnosis not present

## 2021-12-17 DIAGNOSIS — M19012 Primary osteoarthritis, left shoulder: Secondary | ICD-10-CM | POA: Diagnosis not present

## 2021-12-22 DIAGNOSIS — R0789 Other chest pain: Secondary | ICD-10-CM | POA: Diagnosis not present

## 2021-12-24 DIAGNOSIS — R0789 Other chest pain: Secondary | ICD-10-CM | POA: Diagnosis not present

## 2021-12-24 DIAGNOSIS — M940 Chondrocostal junction syndrome [Tietze]: Secondary | ICD-10-CM | POA: Diagnosis not present

## 2021-12-24 DIAGNOSIS — K219 Gastro-esophageal reflux disease without esophagitis: Secondary | ICD-10-CM | POA: Diagnosis not present

## 2021-12-24 DIAGNOSIS — R079 Chest pain, unspecified: Secondary | ICD-10-CM | POA: Diagnosis not present

## 2021-12-26 DIAGNOSIS — Z6827 Body mass index (BMI) 27.0-27.9, adult: Secondary | ICD-10-CM | POA: Diagnosis not present

## 2021-12-26 DIAGNOSIS — Z471 Aftercare following joint replacement surgery: Secondary | ICD-10-CM | POA: Diagnosis not present

## 2021-12-26 DIAGNOSIS — I10 Essential (primary) hypertension: Secondary | ICD-10-CM | POA: Diagnosis not present

## 2021-12-26 DIAGNOSIS — G8918 Other acute postprocedural pain: Secondary | ICD-10-CM | POA: Diagnosis not present

## 2021-12-26 DIAGNOSIS — Z96642 Presence of left artificial hip joint: Secondary | ICD-10-CM | POA: Diagnosis not present

## 2021-12-26 DIAGNOSIS — M75102 Unspecified rotator cuff tear or rupture of left shoulder, not specified as traumatic: Secondary | ICD-10-CM | POA: Diagnosis not present

## 2021-12-26 DIAGNOSIS — M19012 Primary osteoarthritis, left shoulder: Secondary | ICD-10-CM | POA: Diagnosis not present

## 2021-12-26 DIAGNOSIS — E119 Type 2 diabetes mellitus without complications: Secondary | ICD-10-CM | POA: Diagnosis not present

## 2021-12-26 DIAGNOSIS — Z96612 Presence of left artificial shoulder joint: Secondary | ICD-10-CM | POA: Diagnosis not present

## 2021-12-26 DIAGNOSIS — R0600 Dyspnea, unspecified: Secondary | ICD-10-CM | POA: Diagnosis not present

## 2021-12-26 DIAGNOSIS — Z9889 Other specified postprocedural states: Secondary | ICD-10-CM | POA: Diagnosis not present

## 2021-12-26 DIAGNOSIS — R2689 Other abnormalities of gait and mobility: Secondary | ICD-10-CM | POA: Diagnosis not present

## 2021-12-26 DIAGNOSIS — E669 Obesity, unspecified: Secondary | ICD-10-CM | POA: Diagnosis not present

## 2021-12-30 DIAGNOSIS — Z87898 Personal history of other specified conditions: Secondary | ICD-10-CM | POA: Diagnosis not present

## 2021-12-30 DIAGNOSIS — Z09 Encounter for follow-up examination after completed treatment for conditions other than malignant neoplasm: Secondary | ICD-10-CM | POA: Diagnosis not present

## 2021-12-30 DIAGNOSIS — Z96612 Presence of left artificial shoulder joint: Secondary | ICD-10-CM | POA: Diagnosis not present

## 2021-12-30 DIAGNOSIS — K5900 Constipation, unspecified: Secondary | ICD-10-CM | POA: Diagnosis not present

## 2022-01-02 DIAGNOSIS — M6281 Muscle weakness (generalized): Secondary | ICD-10-CM | POA: Diagnosis not present

## 2022-01-02 DIAGNOSIS — M25512 Pain in left shoulder: Secondary | ICD-10-CM | POA: Diagnosis not present

## 2022-01-02 DIAGNOSIS — R293 Abnormal posture: Secondary | ICD-10-CM | POA: Diagnosis not present

## 2022-01-02 DIAGNOSIS — M25612 Stiffness of left shoulder, not elsewhere classified: Secondary | ICD-10-CM | POA: Diagnosis not present

## 2022-01-06 DIAGNOSIS — M6281 Muscle weakness (generalized): Secondary | ICD-10-CM | POA: Diagnosis not present

## 2022-01-06 DIAGNOSIS — M25612 Stiffness of left shoulder, not elsewhere classified: Secondary | ICD-10-CM | POA: Diagnosis not present

## 2022-01-06 DIAGNOSIS — R293 Abnormal posture: Secondary | ICD-10-CM | POA: Diagnosis not present

## 2022-01-06 DIAGNOSIS — M25512 Pain in left shoulder: Secondary | ICD-10-CM | POA: Diagnosis not present

## 2022-01-08 DIAGNOSIS — M25512 Pain in left shoulder: Secondary | ICD-10-CM | POA: Diagnosis not present

## 2022-01-08 DIAGNOSIS — M6281 Muscle weakness (generalized): Secondary | ICD-10-CM | POA: Diagnosis not present

## 2022-01-08 DIAGNOSIS — M25612 Stiffness of left shoulder, not elsewhere classified: Secondary | ICD-10-CM | POA: Diagnosis not present

## 2022-01-08 DIAGNOSIS — R293 Abnormal posture: Secondary | ICD-10-CM | POA: Diagnosis not present

## 2022-01-09 DIAGNOSIS — Z09 Encounter for follow-up examination after completed treatment for conditions other than malignant neoplasm: Secondary | ICD-10-CM | POA: Diagnosis not present

## 2022-01-09 DIAGNOSIS — Z8719 Personal history of other diseases of the digestive system: Secondary | ICD-10-CM | POA: Diagnosis not present

## 2022-01-14 DIAGNOSIS — M25612 Stiffness of left shoulder, not elsewhere classified: Secondary | ICD-10-CM | POA: Diagnosis not present

## 2022-01-14 DIAGNOSIS — R293 Abnormal posture: Secondary | ICD-10-CM | POA: Diagnosis not present

## 2022-01-14 DIAGNOSIS — M25512 Pain in left shoulder: Secondary | ICD-10-CM | POA: Diagnosis not present

## 2022-01-14 DIAGNOSIS — M6281 Muscle weakness (generalized): Secondary | ICD-10-CM | POA: Diagnosis not present

## 2022-01-16 DIAGNOSIS — M25612 Stiffness of left shoulder, not elsewhere classified: Secondary | ICD-10-CM | POA: Diagnosis not present

## 2022-01-16 DIAGNOSIS — M25512 Pain in left shoulder: Secondary | ICD-10-CM | POA: Diagnosis not present

## 2022-01-16 DIAGNOSIS — M6281 Muscle weakness (generalized): Secondary | ICD-10-CM | POA: Diagnosis not present

## 2022-01-16 DIAGNOSIS — R293 Abnormal posture: Secondary | ICD-10-CM | POA: Diagnosis not present

## 2022-01-17 DIAGNOSIS — H402232 Chronic angle-closure glaucoma, bilateral, moderate stage: Secondary | ICD-10-CM | POA: Diagnosis not present

## 2022-01-20 DIAGNOSIS — M6281 Muscle weakness (generalized): Secondary | ICD-10-CM | POA: Diagnosis not present

## 2022-01-20 DIAGNOSIS — R293 Abnormal posture: Secondary | ICD-10-CM | POA: Diagnosis not present

## 2022-01-20 DIAGNOSIS — M25512 Pain in left shoulder: Secondary | ICD-10-CM | POA: Diagnosis not present

## 2022-01-20 DIAGNOSIS — M25612 Stiffness of left shoulder, not elsewhere classified: Secondary | ICD-10-CM | POA: Diagnosis not present

## 2022-01-22 DIAGNOSIS — R293 Abnormal posture: Secondary | ICD-10-CM | POA: Diagnosis not present

## 2022-01-22 DIAGNOSIS — M25512 Pain in left shoulder: Secondary | ICD-10-CM | POA: Diagnosis not present

## 2022-01-22 DIAGNOSIS — M25612 Stiffness of left shoulder, not elsewhere classified: Secondary | ICD-10-CM | POA: Diagnosis not present

## 2022-01-22 DIAGNOSIS — M6281 Muscle weakness (generalized): Secondary | ICD-10-CM | POA: Diagnosis not present

## 2022-01-28 DIAGNOSIS — M25512 Pain in left shoulder: Secondary | ICD-10-CM | POA: Diagnosis not present

## 2022-01-28 DIAGNOSIS — R42 Dizziness and giddiness: Secondary | ICD-10-CM | POA: Diagnosis not present

## 2022-01-29 DIAGNOSIS — R293 Abnormal posture: Secondary | ICD-10-CM | POA: Diagnosis not present

## 2022-01-29 DIAGNOSIS — M25512 Pain in left shoulder: Secondary | ICD-10-CM | POA: Diagnosis not present

## 2022-01-29 DIAGNOSIS — M6281 Muscle weakness (generalized): Secondary | ICD-10-CM | POA: Diagnosis not present

## 2022-01-29 DIAGNOSIS — M25612 Stiffness of left shoulder, not elsewhere classified: Secondary | ICD-10-CM | POA: Diagnosis not present

## 2022-01-30 DIAGNOSIS — Z6827 Body mass index (BMI) 27.0-27.9, adult: Secondary | ICD-10-CM | POA: Diagnosis not present

## 2022-01-30 DIAGNOSIS — R42 Dizziness and giddiness: Secondary | ICD-10-CM | POA: Diagnosis not present

## 2022-01-31 DIAGNOSIS — M6281 Muscle weakness (generalized): Secondary | ICD-10-CM | POA: Diagnosis not present

## 2022-01-31 DIAGNOSIS — R293 Abnormal posture: Secondary | ICD-10-CM | POA: Diagnosis not present

## 2022-01-31 DIAGNOSIS — M25612 Stiffness of left shoulder, not elsewhere classified: Secondary | ICD-10-CM | POA: Diagnosis not present

## 2022-01-31 DIAGNOSIS — M25512 Pain in left shoulder: Secondary | ICD-10-CM | POA: Diagnosis not present

## 2022-02-05 DIAGNOSIS — R42 Dizziness and giddiness: Secondary | ICD-10-CM | POA: Diagnosis not present

## 2022-02-05 DIAGNOSIS — M25512 Pain in left shoulder: Secondary | ICD-10-CM | POA: Diagnosis not present

## 2022-02-05 DIAGNOSIS — M19012 Primary osteoarthritis, left shoulder: Secondary | ICD-10-CM | POA: Diagnosis not present

## 2022-02-05 DIAGNOSIS — K219 Gastro-esophageal reflux disease without esophagitis: Secondary | ICD-10-CM | POA: Diagnosis not present

## 2022-02-05 DIAGNOSIS — I10 Essential (primary) hypertension: Secondary | ICD-10-CM | POA: Diagnosis not present

## 2022-02-05 DIAGNOSIS — E782 Mixed hyperlipidemia: Secondary | ICD-10-CM | POA: Diagnosis not present

## 2022-02-06 DIAGNOSIS — M25512 Pain in left shoulder: Secondary | ICD-10-CM | POA: Diagnosis not present

## 2022-02-06 DIAGNOSIS — M6281 Muscle weakness (generalized): Secondary | ICD-10-CM | POA: Diagnosis not present

## 2022-02-06 DIAGNOSIS — R293 Abnormal posture: Secondary | ICD-10-CM | POA: Diagnosis not present

## 2022-02-06 DIAGNOSIS — M25612 Stiffness of left shoulder, not elsewhere classified: Secondary | ICD-10-CM | POA: Diagnosis not present

## 2022-02-17 DIAGNOSIS — M25512 Pain in left shoulder: Secondary | ICD-10-CM | POA: Diagnosis not present

## 2022-02-17 DIAGNOSIS — M25612 Stiffness of left shoulder, not elsewhere classified: Secondary | ICD-10-CM | POA: Diagnosis not present

## 2022-02-17 DIAGNOSIS — M6281 Muscle weakness (generalized): Secondary | ICD-10-CM | POA: Diagnosis not present

## 2022-02-17 DIAGNOSIS — R293 Abnormal posture: Secondary | ICD-10-CM | POA: Diagnosis not present

## 2022-02-18 ENCOUNTER — Encounter: Payer: Self-pay | Admitting: Cardiology

## 2022-02-18 ENCOUNTER — Ambulatory Visit: Payer: PPO | Admitting: Cardiology

## 2022-02-18 VITALS — BP 120/60 | HR 70 | Resp 18 | Ht 63.0 in | Wt 152.4 lb

## 2022-02-18 DIAGNOSIS — E782 Mixed hyperlipidemia: Secondary | ICD-10-CM

## 2022-02-18 DIAGNOSIS — I350 Nonrheumatic aortic (valve) stenosis: Secondary | ICD-10-CM

## 2022-02-18 DIAGNOSIS — I2510 Atherosclerotic heart disease of native coronary artery without angina pectoris: Secondary | ICD-10-CM | POA: Diagnosis not present

## 2022-02-18 DIAGNOSIS — I10 Essential (primary) hypertension: Secondary | ICD-10-CM | POA: Diagnosis not present

## 2022-02-18 NOTE — Progress Notes (Signed)
Cardiology Office Note:    Date:  02/18/2022   ID:  [name], DOB 1940-09-28, MRN [id number]  PCP:  Mateo Flow, MD  Cardiologist:  Jenean Lindau, MD   Referring MD: Mateo Flow, MD    ASSESSMENT:    1. Coronary artery disease involving native coronary artery of native heart without angina pectoris   2. Essential hypertension   3. Mild aortic stenosis   4. Mixed hyperlipidemia    PLAN:    In order of problems listed above:  Coronary artery disease: Secondary prevention stressed with the patient.  Importance of compliance with diet medication stressed and she vocalized understanding.  She is doing her best to ambulate and without any symptoms. Essential hypertension: Blood pressure stable and diet was emphasized.  She complains of some dizziness at times especially suggesting postural hypotension.  We could not elicit this today.  But have asked her to stop her amlodipine and keep a track of her blood pressures and get back to [place] in a week or so. Mixed dyslipidemia: Diet was emphasized.  Lifestyle modification urged Mild aortic stenosis: Medical management at this time.  Questions answered. Patient will be seen in follow-up appointment in 6 months or earlier if the patient has any concerns    Medication Adjustments/Labs and Tests Ordered: Current medicines are reviewed at length with the patient today.  Concerns regarding medicines are outlined above.  No orders of the defined types were placed in this encounter.  No orders of the defined types were placed in this encounter.    Chief Complaint  Patient presents with   Follow-up    Pt states that her PCP told her she had a heart murmur. Pt reports lightheaded for 1 year and at times shortness of breath. Pt is alert and talking full sentences. Pt states she has saw her PCP for lightheaded and had negative orthostatic changes.      History of Present Illness:    Sheila [name] is a 81 y.o. female.  Patient has  past medical history of mild aortic stenosis, coronary artery disease, essential hypertension, dyslipidemia.  She has undergoing left shoulder replacement surgery.  She denies any problems at this time and takes care of activities of daily living.  No chest pain orthopnea or PND.  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[place]) 05/18/2020   Coronary artery disease involving native coronary artery of native heart without angina pectoris 10/10/2015   Depression    Dyslipidemia 10/10/2015   Dyspnea 11/21/2019   Essential hypertension 08/28/2016   GERD (gastroesophageal reflux disease)    Glaucoma    Headache above the eye region 11/10/2017   Heart murmur    History of right mastoidectomy 06/29/2020   Hyperlipidemia    Hypertension    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    EYE SURGERY     LEFT  HEART CATH AND CORONARY ANGIOGRAPHY N/A 12/28/2017   Procedure: LEFT HEART CATH AND CORONARY ANGIOGRAPHY;  Surgeon: Belva Crome, MD;  Location: [facility];  Service: Cardiovascular;  Laterality: N/A;   MASTOIDECTOMY     PATENT DUCTUS ARTERIOUS REPAIR     SHOULDER OPEN ROTATOR CUFF REPAIR Bilateral    UPPER GASTROINTESTINAL ENDOSCOPY  10/15/2021    Current Medications: Current Meds  Medication Sig   albuterol (VENTOLIN HFA) 108 (90 Base) MCG/ACT inhaler Inhale 2 puffs into the lungs every 6 (six) hours as needed for wheezing or shortness of breath.    ALPRAZolam (XANAX) 0.25 MG tablet Take 0.25 mg by mouth 2 (two) times daily as needed for anxiety.   amLODipine (NORVASC) 2.5 MG tablet Take 1 tablet by mouth daily.   aspirin EC 81 MG tablet Take 81 mg by mouth daily.   carvedilol (COREG) 6.25 MG tablet Take 6.25 mg by mouth 2 (two) times daily with a meal.   Coenzyme Q10 (CO Q-10) 200 MG CAPS Take 1 capsule by mouth daily.   dorzolamide-timolol (COSOPT) 22.3-6.8 MG/ML ophthalmic solution Place 1 drop into both eyes 2 (two) times daily.   FLOVENT HFA 110 MCG/ACT inhaler Inhale 1 puff into the lungs 2 (two) times daily.   fluticasone (FLONASE) 50 MCG/ACT nasal spray Place 2 sprays into both nostrils daily.   furosemide (LASIX) 40 MG tablet Take 20 mg by mouth daily.   hydrALAZINE (APRESOLINE) 25 MG tablet Take 12.5 mg by mouth as needed (high blood pressure).   isosorbide mononitrate (IMDUR) 30 MG 24 hr tablet TAKE ONE TABLET BY MOUTH EVERY MORNING   linaclotide (LINZESS) 72 MCG capsule Take 72 mcg by mouth daily.   Multiple Vitamin (MULTIVITAMIN) tablet Take 1 tablet by mouth daily.   nitroGLYCERIN (NITROSTAT) 0.4 MG SL tablet DISSOLVE 1 TABLET UNDER THE TONGUE EVERY 5 MINUTES AS NEEDED FOR CHEST PAIN. DO NOT EXCEED A TOTAL OF 3 DOSES IN 15 MINUTES.   pantoprazole (PROTONIX) 40 MG tablet Take 1 tablet (40 mg total) by mouth daily.   potassium chloride (MICRO-K) 10 MEQ CR capsule Take 10 mEq by mouth every morning.   rosuvastatin (CRESTOR) 10 MG tablet Take 10 mg by mouth daily.   traMADol-acetaminophen (ULTRACET) 37.5-325 MG tablet Take 1-2 tablets by mouth 2 (two) times daily as needed for pain.     Allergies:   Antihistamines, chlorpheniramine-type; Ativan [lorazepam]; Ceftin [cefuroxime axetil]; Meloxicam; Pheneen [benzalkonium chloride]; and Morphine   Social History   Socioeconomic History   Marital status: Divorced    Spouse name: Not on file   Number of children: 5   Years of education: Not on file   Highest education level:  Not on file  Occupational History   Occupation: Retired   Tobacco Use   Smoking status: Never   Smokeless tobacco: Never  Vaping Use   Vaping Use: Never used  Substance and Sexual Activity   Alcohol use: No   Drug use: Never   Sexual activity: Not Currently    Birth control/protection: Post-menopausal  Other Topics Concern   Not on file  Social History Narrative   Not on file   Social Determinants of Health   Financial Resource Strain: Not on file  Food Insecurity: Not on file  Transportation Needs: Not on file  Physical Activity: Not on file  Stress: Not on file  Social Connections: Not on file     Family History: The patient's family history includes Colon cancer in her brother;  Diabetes in her brother and daughter; Heart attack in her mother and sister; Stomach cancer in her sister. There is no history of Esophageal cancer.  ROS:   Please see the history of present illness.    All other systems reviewed and are negative.  EKGs/Labs/Other Studies Reviewed:    The following studies were reviewed today: I discussed my findings with the patient at length   Recent Labs: No results found for requested labs within last 365 days.  Recent Lipid Panel No results found for: "CHOL", "TRIG", "HDL", "CHOLHDL", "VLDL", "LDLCALC", "LDLDIRECT"  Physical Exam:    VS:  BP 120/60 (BP Location: Right Arm, Patient Position: Sitting, Cuff Size: Normal)   Pulse 70   Resp 18   Ht '5\' 3"'$  (1.6 m)   Wt 152 lb 6.4 oz (69.1 kg)   SpO2 97%   BMI 27.00 kg/m     Wt Readings from Last 3 Encounters:  02/18/22 152 lb 6.4 oz (69.1 kg)  10/15/21 153 lb (69.4 kg)  09/19/21 153 lb (69.4 kg)     GEN: Patient is in no acute distress HEENT: Normal NECK: No JVD; No carotid bruits LYMPHATICS: No lymphadenopathy CARDIAC: Hear sounds regular, 2/6 systolic murmur at the apex and at the aortic area. RESPIRATORY:  Clear to auscultation without rales, wheezing or rhonchi  ABDOMEN: Soft,  non-tender, non-distended MUSCULOSKELETAL:  No edema; No deformity  SKIN: Warm and dry NEUROLOGIC:  Alert and oriented x 3 PSYCHIATRIC:  Normal affect   Signed, Jenean Lindau, MD  02/18/2022 1:18 PM    [facility]

## 2022-02-18 NOTE — Patient Instructions (Signed)
Please keep a BP log for 2 weeks and send by MyChart or mail.  Blood Pressure Record Sheet To take your blood pressure, you will need a blood pressure machine. You can buy a blood pressure machine (blood pressure monitor) at your clinic, drug store, or online. When choosing one, consider: An automatic monitor that has an arm cuff. A cuff that wraps snugly around your upper arm. You should be able to fit only one finger between your arm and the cuff. A device that stores blood pressure reading results. Do not choose a monitor that measures your blood pressure from your wrist or finger. Follow your health care provider's instructions for how to take your blood pressure. To use this form: Get one reading in the morning (a.m.) 1-2 hours after you take any medicines. Get one reading in the evening (p.m.) before supper. Write down the results in the spaces on this form. Repeat this once a week, or as told by your health care provider.  Make a follow-up appointment with your health care provider to discuss the results. Blood pressure log Date: _______________________ a.m. _____________________(1st reading) HR___________            p.m. _____________________(2nd reading) HR__________  Date: _______________________ a.m. _____________________(1st reading) HR___________            p.m. _____________________(2nd reading) HR__________ Date: _______________________ a.m. _____________________(1st reading) HR___________            p.m. _____________________(2nd reading) HR__________ Date: _______________________ a.m. _____________________(1st reading) HR___________            p.m. _____________________(2nd reading) HR__________  Date: _______________________ a.m. _____________________(1st reading) HR___________            p.m. _____________________(2nd reading) HR__________  Date: _______________________ a.m. _____________________(1st reading) HR___________            p.m.  _____________________(2nd reading) HR__________  Date: _______________________ a.m. _____________________(1st reading) HR___________            p.m. _____________________(2nd reading) HR__________   This information is not intended to replace advice given to you by your health care provider. Make sure you discuss any questions you have with your health care provider. Document Revised: 12/14/2019 Document Reviewed: 12/14/2019 Elsevier Patient Education  [place].   Medication Instructions:  Your physician has recommended you make the following change in your medication:   Stop Amlodipine.  *If you need a refill on your cardiac medications before your next appointment, please call your pharmacy*   Lab Work: None ordered If you have labs (blood work) drawn today and your tests are completely normal, you will receive your results only by: [place] (if you have MyChart) OR A paper copy in the mail If you have any lab test that is abnormal or we need to change your treatment, we will call you to review the results.   Testing/Procedures: None ordered   Follow-Up: At [facility], you and your health needs are our priority.  As part of our continuing mission to provide you with exceptional heart care, we have created designated Provider Care Teams.  These Care Teams include your primary Cardiologist (physician) and Advanced Practice Providers (APPs -  Physician Assistants and Nurse Practitioners) who all work together to provide you with the care you need, when you need it.  We recommend signing up for the patient portal called "MyChart".  Sign up information is provided on this After Visit Summary.  MyChart is used to connect with patients for Virtual Visits (Telemedicine).  Patients are  able to view lab/test results, encounter notes, upcoming appointments, etc.  Non-urgent messages can be sent to your provider as well.   To learn more about what you can do with  MyChart, go to [email or web address].    Your next appointment:   6 month(s)  The format for your next appointment:   In Person  Provider:   Jyl Heinz, MD   Other Instructions NA

## 2022-02-19 DIAGNOSIS — M25512 Pain in left shoulder: Secondary | ICD-10-CM | POA: Diagnosis not present

## 2022-02-19 DIAGNOSIS — R519 Headache, unspecified: Secondary | ICD-10-CM | POA: Diagnosis not present

## 2022-02-19 DIAGNOSIS — M6281 Muscle weakness (generalized): Secondary | ICD-10-CM | POA: Diagnosis not present

## 2022-02-19 DIAGNOSIS — R110 Nausea: Secondary | ICD-10-CM | POA: Diagnosis not present

## 2022-02-19 DIAGNOSIS — R293 Abnormal posture: Secondary | ICD-10-CM | POA: Diagnosis not present

## 2022-02-19 DIAGNOSIS — M25612 Stiffness of left shoulder, not elsewhere classified: Secondary | ICD-10-CM | POA: Diagnosis not present

## 2022-02-21 DIAGNOSIS — Z7722 Contact with and (suspected) exposure to environmental tobacco smoke (acute) (chronic): Secondary | ICD-10-CM | POA: Diagnosis not present

## 2022-02-21 DIAGNOSIS — F321 Major depressive disorder, single episode, moderate: Secondary | ICD-10-CM | POA: Diagnosis not present

## 2022-02-21 DIAGNOSIS — E261 Secondary hyperaldosteronism: Secondary | ICD-10-CM | POA: Diagnosis not present

## 2022-02-21 DIAGNOSIS — I110 Hypertensive heart disease with heart failure: Secondary | ICD-10-CM | POA: Diagnosis not present

## 2022-02-21 DIAGNOSIS — R110 Nausea: Secondary | ICD-10-CM | POA: Diagnosis not present

## 2022-02-21 DIAGNOSIS — J449 Chronic obstructive pulmonary disease, unspecified: Secondary | ICD-10-CM | POA: Diagnosis not present

## 2022-02-21 DIAGNOSIS — R42 Dizziness and giddiness: Secondary | ICD-10-CM | POA: Diagnosis not present

## 2022-02-21 DIAGNOSIS — Z7951 Long term (current) use of inhaled steroids: Secondary | ICD-10-CM | POA: Diagnosis not present

## 2022-02-21 DIAGNOSIS — R519 Headache, unspecified: Secondary | ICD-10-CM | POA: Diagnosis not present

## 2022-02-21 DIAGNOSIS — Z7982 Long term (current) use of aspirin: Secondary | ICD-10-CM | POA: Diagnosis not present

## 2022-02-21 DIAGNOSIS — I509 Heart failure, unspecified: Secondary | ICD-10-CM | POA: Diagnosis not present

## 2022-02-21 DIAGNOSIS — I700 Atherosclerosis of aorta: Secondary | ICD-10-CM | POA: Diagnosis not present

## 2022-02-25 DIAGNOSIS — Z6827 Body mass index (BMI) 27.0-27.9, adult: Secondary | ICD-10-CM | POA: Diagnosis not present

## 2022-02-25 DIAGNOSIS — R42 Dizziness and giddiness: Secondary | ICD-10-CM | POA: Diagnosis not present

## 2022-02-26 DIAGNOSIS — M6281 Muscle weakness (generalized): Secondary | ICD-10-CM | POA: Diagnosis not present

## 2022-02-26 DIAGNOSIS — R293 Abnormal posture: Secondary | ICD-10-CM | POA: Diagnosis not present

## 2022-02-26 DIAGNOSIS — M25512 Pain in left shoulder: Secondary | ICD-10-CM | POA: Diagnosis not present

## 2022-02-26 DIAGNOSIS — M25612 Stiffness of left shoulder, not elsewhere classified: Secondary | ICD-10-CM | POA: Diagnosis not present

## 2022-02-27 ENCOUNTER — Encounter: Payer: Self-pay | Admitting: Neurology

## 2022-03-03 DIAGNOSIS — M25512 Pain in left shoulder: Secondary | ICD-10-CM | POA: Diagnosis not present

## 2022-03-03 DIAGNOSIS — M25612 Stiffness of left shoulder, not elsewhere classified: Secondary | ICD-10-CM | POA: Diagnosis not present

## 2022-03-03 DIAGNOSIS — R293 Abnormal posture: Secondary | ICD-10-CM | POA: Diagnosis not present

## 2022-03-03 DIAGNOSIS — M6281 Muscle weakness (generalized): Secondary | ICD-10-CM | POA: Diagnosis not present

## 2022-03-05 DIAGNOSIS — M6281 Muscle weakness (generalized): Secondary | ICD-10-CM | POA: Diagnosis not present

## 2022-03-05 DIAGNOSIS — M25512 Pain in left shoulder: Secondary | ICD-10-CM | POA: Diagnosis not present

## 2022-03-05 DIAGNOSIS — M25612 Stiffness of left shoulder, not elsewhere classified: Secondary | ICD-10-CM | POA: Diagnosis not present

## 2022-03-05 DIAGNOSIS — R293 Abnormal posture: Secondary | ICD-10-CM | POA: Diagnosis not present

## 2022-03-10 DIAGNOSIS — M25612 Stiffness of left shoulder, not elsewhere classified: Secondary | ICD-10-CM | POA: Diagnosis not present

## 2022-03-10 DIAGNOSIS — R293 Abnormal posture: Secondary | ICD-10-CM | POA: Diagnosis not present

## 2022-03-10 DIAGNOSIS — M25512 Pain in left shoulder: Secondary | ICD-10-CM | POA: Diagnosis not present

## 2022-03-10 DIAGNOSIS — M6281 Muscle weakness (generalized): Secondary | ICD-10-CM | POA: Diagnosis not present

## 2022-03-12 DIAGNOSIS — M25512 Pain in left shoulder: Secondary | ICD-10-CM | POA: Diagnosis not present

## 2022-03-12 DIAGNOSIS — M25612 Stiffness of left shoulder, not elsewhere classified: Secondary | ICD-10-CM | POA: Diagnosis not present

## 2022-03-12 DIAGNOSIS — M6281 Muscle weakness (generalized): Secondary | ICD-10-CM | POA: Diagnosis not present

## 2022-03-12 DIAGNOSIS — R293 Abnormal posture: Secondary | ICD-10-CM | POA: Diagnosis not present

## 2022-03-17 DIAGNOSIS — M25612 Stiffness of left shoulder, not elsewhere classified: Secondary | ICD-10-CM | POA: Diagnosis not present

## 2022-03-17 DIAGNOSIS — M6281 Muscle weakness (generalized): Secondary | ICD-10-CM | POA: Diagnosis not present

## 2022-03-17 DIAGNOSIS — R293 Abnormal posture: Secondary | ICD-10-CM | POA: Diagnosis not present

## 2022-03-17 DIAGNOSIS — M25512 Pain in left shoulder: Secondary | ICD-10-CM | POA: Diagnosis not present

## 2022-03-19 DIAGNOSIS — M25612 Stiffness of left shoulder, not elsewhere classified: Secondary | ICD-10-CM | POA: Diagnosis not present

## 2022-03-19 DIAGNOSIS — M25512 Pain in left shoulder: Secondary | ICD-10-CM | POA: Diagnosis not present

## 2022-03-19 DIAGNOSIS — M6281 Muscle weakness (generalized): Secondary | ICD-10-CM | POA: Diagnosis not present

## 2022-03-19 DIAGNOSIS — R293 Abnormal posture: Secondary | ICD-10-CM | POA: Diagnosis not present

## 2022-03-20 ENCOUNTER — Telehealth: Payer: Self-pay | Admitting: Cardiology

## 2022-03-20 NOTE — Telephone Encounter (Signed)
Pt c/o of Chest Pain: STAT if CP now or developed within 24 hours  1. Are you having CP right now? No (tightness)  2. Are you experiencing any other symptoms (ex. SOB, nausea, vomiting, sweating)? Lightheadedness   3. How long have you been experiencing CP? 2-3 weeks now  4. Is your CP continuous or coming and going? Coming and going   5. Have you taken Nitroglycerin? no ?  Schd her appt for tomorrow 4

## 2022-03-20 NOTE — Telephone Encounter (Signed)
Advised to go to the ED if chest pain returns, if the pain radiates or any associated sx of N/V, diaphoresis or shortness of breath. Pt verbalized understanding and had no additional questions.

## 2022-03-21 ENCOUNTER — Other Ambulatory Visit: Payer: Self-pay

## 2022-03-21 ENCOUNTER — Encounter: Payer: Self-pay | Admitting: Cardiology

## 2022-03-21 ENCOUNTER — Ambulatory Visit: Payer: PPO | Admitting: Cardiology

## 2022-03-21 VITALS — BP 151/72 | HR 60 | Ht 63.6 in | Wt 153.8 lb

## 2022-03-21 DIAGNOSIS — I10 Essential (primary) hypertension: Secondary | ICD-10-CM

## 2022-03-21 DIAGNOSIS — E785 Hyperlipidemia, unspecified: Secondary | ICD-10-CM

## 2022-03-21 DIAGNOSIS — I350 Nonrheumatic aortic (valve) stenosis: Secondary | ICD-10-CM

## 2022-03-21 DIAGNOSIS — I2510 Atherosclerotic heart disease of native coronary artery without angina pectoris: Secondary | ICD-10-CM | POA: Diagnosis not present

## 2022-03-21 NOTE — Patient Instructions (Signed)

## 2022-03-21 NOTE — Progress Notes (Signed)
Cardiology Office Note:    Date:  03/21/2022   ID:  Sheila [name], DOB July 22, 1941, MRN [id number]  PCP:  Mateo Flow, MD  Cardiologist:  Jenean Lindau, MD   Referring MD: Mateo Flow, MD    ASSESSMENT:    1. Coronary artery disease involving native coronary artery of native heart without angina pectoris   2. Essential hypertension   3. Mild aortic stenosis   4. Dyslipidemia    PLAN:    In order of problems listed above:  Second prevention stressed with the patient.  Importance of compliance with diet medication stressed and she vocalized understanding. Mild coronary artery disease: Stable in nature.  Asymptomatic.  Secondary prevention stressed. Essential hypertension: Blood pressure is stable and diet was emphasized.  Lifestyle modification urged.  She keeps a close track of blood pressures and brought me home readings and they are fine.  I am a little concerned about this too aggressive blood pressure control because of hypotension and potential for fall.  She has had issues with dizziness.  She has undergone mastoidectomy and such surgeries in the past. Mixed dyslipidemia: On statin therapy.  Lipids reviewed from [facility] sheet and diet process. Mild aortic regurgitation: Stable at this time and medical management. Patient will be seen in follow-up appointment in 6 months or earlier if the patient has any concerns    Medication Adjustments/Labs and Tests Ordered: Current medicines are reviewed at length with the patient today.  Concerns regarding medicines are outlined above.  No orders of the defined types were placed in this encounter.  No orders of the defined types were placed in this encounter.    No chief complaint on file.    History of Present Illness:    Sheila [name] is a 81 y.o. female.  Patient has past medical history of essential hypertension, mixed dyslipidemia and mild aortic regurgitation.  She denies any problems at this time and takes care of  activities of daily living.  No chest pain orthopnea or PND.  She checks her blood pressure on a regular basis and is happy about it.   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[place]) 05/18/2020   Coronary artery disease involving native coronary artery of native heart without angina pectoris 10/10/2015   Depression    Dyslipidemia 10/10/2015   Dyspnea 11/21/2019   Essential hypertension 08/28/2016   GERD (gastroesophageal reflux disease)    Glaucoma    Headache above the eye region 11/10/2017   Heart murmur    History of right mastoidectomy 06/29/2020   Hyperlipidemia    Hypertension    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    EYE SURGERY     LEFT HEART CATH AND CORONARY ANGIOGRAPHY N/A 12/28/2017   Procedure: LEFT HEART CATH AND CORONARY ANGIOGRAPHY;  Surgeon: Belva Crome, MD;  Location: [facility];  Service: Cardiovascular;  Laterality: N/A;   MASTOIDECTOMY     PATENT DUCTUS ARTERIOUS REPAIR     SHOULDER OPEN ROTATOR CUFF REPAIR Bilateral    UPPER GASTROINTESTINAL ENDOSCOPY  10/15/2021    Current Medications: Current Meds  Medication Sig   albuterol (VENTOLIN HFA) 108 (90 Base) MCG/ACT inhaler Inhale 2 puffs into the lungs every 6 (six) hours as needed for wheezing or shortness of breath.   ALPRAZolam (XANAX) 0.25 MG tablet Take 0.25 mg by mouth 2 (two) times daily as needed for anxiety.   aspirin EC 81 MG tablet Take 81 mg by  mouth daily.   carvedilol (COREG) 6.25 MG tablet Take 6.25 mg by mouth 2 (two) times daily with a meal.   Coenzyme Q10 (CO Q-10) 200 MG CAPS Take 1 capsule by mouth daily.   dorzolamide (TRUSOPT) 2 % ophthalmic solution Place 1 drop into both eyes 2 (two) times daily.   FLOVENT HFA 110 MCG/ACT inhaler Inhale 1 puff into the lungs 2 (two) times daily.   fluticasone (FLONASE) 50 MCG/ACT nasal spray Place 2 sprays into both nostrils daily.   furosemide (LASIX) 40 MG tablet Take 20 mg by mouth daily.   hydrALAZINE (APRESOLINE) 25 MG tablet Take 12.5 mg by mouth as needed (high blood pressure).   isosorbide mononitrate (IMDUR) 30 MG 24 hr tablet TAKE ONE TABLET BY MOUTH EVERY MORNING   linaclotide (LINZESS) 72 MCG capsule Take 72 mcg by mouth daily.   Multiple Vitamin (MULTIVITAMIN) tablet Take 1 tablet by mouth daily.   nitroGLYCERIN (NITROSTAT) 0.4 MG SL tablet DISSOLVE 1 TABLET UNDER THE TONGUE EVERY 5 MINUTES AS NEEDED FOR CHEST PAIN. DO NOT EXCEED A TOTAL OF 3 DOSES IN 15 MINUTES.   pantoprazole (PROTONIX) 40 MG tablet Take 1 tablet (40 mg total) by mouth daily.   potassium chloride (MICRO-K) 10 MEQ CR capsule Take 10 mEq by mouth every morning.   rosuvastatin (CRESTOR) 10 MG tablet Take 10 mg by mouth daily.   TIMOLOL MALEATE OP Apply 1 drop to eye 2 (two) times daily.   traMADol-acetaminophen (ULTRACET) 37.5-325 MG tablet Take 1-2 tablets by mouth 2 (two) times daily as needed for pain.     Allergies:   Antihistamines, chlorpheniramine-type; Ativan [lorazepam]; Ceftin [cefuroxime axetil]; Meloxicam; Pheneen [benzalkonium chloride]; and Morphine   Social History   Socioeconomic History   Marital status: Divorced    Spouse name: Not on file   Number of children: 5   Years of education: Not on file   Highest education level: Not on file  Occupational History   Occupation: Retired   Tobacco Use   Smoking status: Never   Smokeless tobacco: Never  Vaping Use   Vaping Use: Never used   Substance and Sexual Activity   Alcohol use: No   Drug use: Never   Sexual activity: Not Currently    Birth control/protection: Post-menopausal  Other Topics Concern   Not on file  Social History Narrative   Not on file   Social Determinants of Health   Financial Resource Strain: Not on file  Food Insecurity: Not on file  Transportation Needs: Not on file  Physical Activity: Not on file  Stress: Not on file  Social Connections: Not on file     Family History: The patient's family history includes Colon cancer in her brother; Diabetes in her brother and daughter; Heart attack in her mother and sister; Stomach cancer in her sister. There is no history of Esophageal cancer.  ROS:   Please see  the history of present illness.    All other systems reviewed and are negative.  EKGs/Labs/Other Studies Reviewed:    The following studies were reviewed today: EKG reveals sinus rhythm and nonspecific ST-T changes   Recent Labs: No results found for requested labs within last 365 days.  Recent Lipid Panel No results found for: "CHOL", "TRIG", "HDL", "CHOLHDL", "VLDL", "LDLCALC", "LDLDIRECT"  Physical Exam:    VS:  BP (!) 151/72   Pulse 60   Ht 5' 3.6" (1.615 m)   Wt 153 lb 12.8 oz (69.8 kg)   SpO2 98%   BMI 26.73 kg/m     Wt Readings from Last 3 Encounters:  03/21/22 153 lb 12.8 oz (69.8 kg)  02/18/22 152 lb 6.4 oz (69.1 kg)  10/15/21 153 lb (69.4 kg)     GEN: Patient is in no acute distress HEENT: Normal NECK: No JVD; No carotid bruits LYMPHATICS: No lymphadenopathy CARDIAC: Hear sounds regular, 2/6 systolic murmur at the apex. RESPIRATORY:  Clear to auscultation without rales, wheezing or rhonchi  ABDOMEN: Soft, non-tender, non-distended MUSCULOSKELETAL:  No edema; No deformity  SKIN: Warm and dry NEUROLOGIC:  Alert and oriented x 3 PSYCHIATRIC:  Normal affect   Signed, Jenean Lindau, MD  03/21/2022 4:13 PM    [facility]

## 2022-03-25 DIAGNOSIS — M25512 Pain in left shoulder: Secondary | ICD-10-CM | POA: Diagnosis not present

## 2022-03-25 DIAGNOSIS — H6691 Otitis media, unspecified, right ear: Secondary | ICD-10-CM | POA: Diagnosis not present

## 2022-03-26 DIAGNOSIS — M6281 Muscle weakness (generalized): Secondary | ICD-10-CM | POA: Diagnosis not present

## 2022-03-26 DIAGNOSIS — R293 Abnormal posture: Secondary | ICD-10-CM | POA: Diagnosis not present

## 2022-03-26 DIAGNOSIS — M25512 Pain in left shoulder: Secondary | ICD-10-CM | POA: Diagnosis not present

## 2022-03-26 DIAGNOSIS — M25612 Stiffness of left shoulder, not elsewhere classified: Secondary | ICD-10-CM | POA: Diagnosis not present

## 2022-04-01 DIAGNOSIS — R293 Abnormal posture: Secondary | ICD-10-CM | POA: Diagnosis not present

## 2022-04-01 DIAGNOSIS — M25512 Pain in left shoulder: Secondary | ICD-10-CM | POA: Diagnosis not present

## 2022-04-01 DIAGNOSIS — M6281 Muscle weakness (generalized): Secondary | ICD-10-CM | POA: Diagnosis not present

## 2022-04-01 DIAGNOSIS — M19011 Primary osteoarthritis, right shoulder: Secondary | ICD-10-CM | POA: Diagnosis not present

## 2022-04-01 DIAGNOSIS — M25612 Stiffness of left shoulder, not elsewhere classified: Secondary | ICD-10-CM | POA: Diagnosis not present

## 2022-04-03 DIAGNOSIS — G9389 Other specified disorders of brain: Secondary | ICD-10-CM | POA: Diagnosis not present

## 2022-04-03 DIAGNOSIS — Z9889 Other specified postprocedural states: Secondary | ICD-10-CM | POA: Diagnosis not present

## 2022-04-03 DIAGNOSIS — R519 Headache, unspecified: Secondary | ICD-10-CM | POA: Diagnosis not present

## 2022-04-03 DIAGNOSIS — G4489 Other headache syndrome: Secondary | ICD-10-CM | POA: Diagnosis not present

## 2022-04-07 DIAGNOSIS — M25612 Stiffness of left shoulder, not elsewhere classified: Secondary | ICD-10-CM | POA: Diagnosis not present

## 2022-04-07 DIAGNOSIS — R293 Abnormal posture: Secondary | ICD-10-CM | POA: Diagnosis not present

## 2022-04-07 DIAGNOSIS — M25512 Pain in left shoulder: Secondary | ICD-10-CM | POA: Diagnosis not present

## 2022-04-07 DIAGNOSIS — M6281 Muscle weakness (generalized): Secondary | ICD-10-CM | POA: Diagnosis not present

## 2022-04-10 ENCOUNTER — Other Ambulatory Visit: Payer: Self-pay | Admitting: Cardiology

## 2022-04-10 DIAGNOSIS — R42 Dizziness and giddiness: Secondary | ICD-10-CM | POA: Diagnosis not present

## 2022-04-10 DIAGNOSIS — G3184 Mild cognitive impairment, so stated: Secondary | ICD-10-CM | POA: Diagnosis not present

## 2022-04-10 DIAGNOSIS — Z6827 Body mass index (BMI) 27.0-27.9, adult: Secondary | ICD-10-CM | POA: Diagnosis not present

## 2022-04-10 DIAGNOSIS — M8580 Other specified disorders of bone density and structure, unspecified site: Secondary | ICD-10-CM | POA: Diagnosis not present

## 2022-04-10 DIAGNOSIS — G43909 Migraine, unspecified, not intractable, without status migrainosus: Secondary | ICD-10-CM | POA: Diagnosis not present

## 2022-04-14 DIAGNOSIS — M6281 Muscle weakness (generalized): Secondary | ICD-10-CM | POA: Diagnosis not present

## 2022-04-14 DIAGNOSIS — R293 Abnormal posture: Secondary | ICD-10-CM | POA: Diagnosis not present

## 2022-04-14 DIAGNOSIS — M25512 Pain in left shoulder: Secondary | ICD-10-CM | POA: Diagnosis not present

## 2022-04-14 DIAGNOSIS — M25612 Stiffness of left shoulder, not elsewhere classified: Secondary | ICD-10-CM | POA: Diagnosis not present

## 2022-04-17 DIAGNOSIS — Z9089 Acquired absence of other organs: Secondary | ICD-10-CM | POA: Diagnosis not present

## 2022-04-17 DIAGNOSIS — M26609 Unspecified temporomandibular joint disorder, unspecified side: Secondary | ICD-10-CM

## 2022-04-17 DIAGNOSIS — Z9889 Other specified postprocedural states: Secondary | ICD-10-CM | POA: Diagnosis not present

## 2022-04-17 DIAGNOSIS — H9201 Otalgia, right ear: Secondary | ICD-10-CM

## 2022-04-17 DIAGNOSIS — R2689 Other abnormalities of gait and mobility: Secondary | ICD-10-CM

## 2022-04-17 DIAGNOSIS — H90A31 Mixed conductive and sensorineural hearing loss, unilateral, right ear with restricted hearing on the contralateral side: Secondary | ICD-10-CM | POA: Diagnosis not present

## 2022-04-17 DIAGNOSIS — R519 Headache, unspecified: Secondary | ICD-10-CM | POA: Diagnosis not present

## 2022-04-17 DIAGNOSIS — G9389 Other specified disorders of brain: Secondary | ICD-10-CM | POA: Diagnosis not present

## 2022-04-17 DIAGNOSIS — H6981 Other specified disorders of Eustachian tube, right ear: Secondary | ICD-10-CM | POA: Diagnosis not present

## 2022-04-17 DIAGNOSIS — H90A22 Sensorineural hearing loss, unilateral, left ear, with restricted hearing on the contralateral side: Secondary | ICD-10-CM | POA: Diagnosis not present

## 2022-04-17 HISTORY — DX: Other abnormalities of gait and mobility: R26.89

## 2022-04-17 HISTORY — DX: Otalgia, right ear: H92.01

## 2022-04-17 HISTORY — DX: Unspecified temporomandibular joint disorder, unspecified side: M26.609

## 2022-04-22 DIAGNOSIS — H9041 Sensorineural hearing loss, unilateral, right ear, with unrestricted hearing on the contralateral side: Secondary | ICD-10-CM | POA: Diagnosis not present

## 2022-04-22 DIAGNOSIS — Z9889 Other specified postprocedural states: Secondary | ICD-10-CM | POA: Diagnosis not present

## 2022-04-25 DIAGNOSIS — H402232 Chronic angle-closure glaucoma, bilateral, moderate stage: Secondary | ICD-10-CM | POA: Diagnosis not present

## 2022-04-28 DIAGNOSIS — M25512 Pain in left shoulder: Secondary | ICD-10-CM | POA: Diagnosis not present

## 2022-04-28 DIAGNOSIS — M25612 Stiffness of left shoulder, not elsewhere classified: Secondary | ICD-10-CM | POA: Diagnosis not present

## 2022-04-28 DIAGNOSIS — R293 Abnormal posture: Secondary | ICD-10-CM | POA: Diagnosis not present

## 2022-04-28 DIAGNOSIS — M6281 Muscle weakness (generalized): Secondary | ICD-10-CM | POA: Diagnosis not present

## 2022-05-05 DIAGNOSIS — M25512 Pain in left shoulder: Secondary | ICD-10-CM | POA: Diagnosis not present

## 2022-05-05 DIAGNOSIS — R293 Abnormal posture: Secondary | ICD-10-CM | POA: Diagnosis not present

## 2022-05-05 DIAGNOSIS — M25612 Stiffness of left shoulder, not elsewhere classified: Secondary | ICD-10-CM | POA: Diagnosis not present

## 2022-05-05 DIAGNOSIS — M6281 Muscle weakness (generalized): Secondary | ICD-10-CM | POA: Diagnosis not present

## 2022-05-08 DIAGNOSIS — Z20828 Contact with and (suspected) exposure to other viral communicable diseases: Secondary | ICD-10-CM | POA: Diagnosis not present

## 2022-05-08 DIAGNOSIS — I10 Essential (primary) hypertension: Secondary | ICD-10-CM | POA: Diagnosis not present

## 2022-05-08 DIAGNOSIS — I509 Heart failure, unspecified: Secondary | ICD-10-CM | POA: Diagnosis not present

## 2022-05-08 DIAGNOSIS — E261 Secondary hyperaldosteronism: Secondary | ICD-10-CM | POA: Diagnosis not present

## 2022-05-08 DIAGNOSIS — R42 Dizziness and giddiness: Secondary | ICD-10-CM | POA: Diagnosis not present

## 2022-05-08 DIAGNOSIS — K219 Gastro-esophageal reflux disease without esophagitis: Secondary | ICD-10-CM | POA: Diagnosis not present

## 2022-05-08 DIAGNOSIS — I110 Hypertensive heart disease with heart failure: Secondary | ICD-10-CM | POA: Diagnosis not present

## 2022-05-08 DIAGNOSIS — E782 Mixed hyperlipidemia: Secondary | ICD-10-CM | POA: Diagnosis not present

## 2022-05-08 DIAGNOSIS — E785 Hyperlipidemia, unspecified: Secondary | ICD-10-CM | POA: Diagnosis not present

## 2022-05-13 DIAGNOSIS — M25612 Stiffness of left shoulder, not elsewhere classified: Secondary | ICD-10-CM | POA: Diagnosis not present

## 2022-05-13 DIAGNOSIS — M6281 Muscle weakness (generalized): Secondary | ICD-10-CM | POA: Diagnosis not present

## 2022-05-13 DIAGNOSIS — M25512 Pain in left shoulder: Secondary | ICD-10-CM | POA: Diagnosis not present

## 2022-05-13 DIAGNOSIS — R293 Abnormal posture: Secondary | ICD-10-CM | POA: Diagnosis not present

## 2022-05-19 DIAGNOSIS — R293 Abnormal posture: Secondary | ICD-10-CM | POA: Diagnosis not present

## 2022-05-19 DIAGNOSIS — M6281 Muscle weakness (generalized): Secondary | ICD-10-CM | POA: Diagnosis not present

## 2022-05-19 DIAGNOSIS — M25512 Pain in left shoulder: Secondary | ICD-10-CM | POA: Diagnosis not present

## 2022-05-19 DIAGNOSIS — M25612 Stiffness of left shoulder, not elsewhere classified: Secondary | ICD-10-CM | POA: Diagnosis not present

## 2022-06-03 NOTE — Progress Notes (Deleted)
NEUROLOGY CONSULTATION NOTE  Sheila [name] MRN: [id number] DOB: November 14, 1940  Referring provider: Kennith Maes, MD Primary care provider: Bertram Millard, MD  Reason for consult:  headache  Assessment/Plan:   ***   Subjective:  Sheila [name] is an 81 year old right-handed female with CAD s/p MI, COPD, CHF, hypertension, dyslipidemia and GERD who presents for headache.  ***   She began having headaches associated with pulsatile tinnitus around 2012-2013.  She describes a constant moderate non-throbbing headache in a band-like distribution but worse on the right side.  She has associated severe intermittent sharp pains in the right temple, ear and radiating down the right side of the neck.  Sometimes she has associated chest discomfort and nausea but no vomiting, photophobia, phonophobia or unilateral numbness or weakness.  She also has constant blurred vision which improves (but no resolves) when covering either eye. She sees a glaucoma specialist.  It is aggravated when standing and relieved when supine.  Rest helps relieve it.  She takes Tylenol, maybe 2 or 3 days a week.  She reportedly had an MRA of the head.  She underwent right tympanomastoidectomy for pulsatile tinnitus in June 2017 by ENT.  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  She was doing fine until the following November when she had sneezed and developed clear drainage from the nose.  Imaging revealed right tegmen defect causing a CSF leak.  She underwent right subtemporal craniotomy for repair of encephalocele on 08/28/16.  She continued to have headache, dizziness and unsteady gait.  In February of 2018, she began having right facial swelling and followed up with ENT.  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  She had elevated blood  pressure readings with SBP in the 200s.  MRI of head was reportedly unremarkable.  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  She has had subsequent head CTs on 04/27/17 and 07/06/17, both which reportedly showed no acute abnormalities.  Most recent MRI of brain with and without contrast from 11/10/17 again showed postsurgical changes of the right middle cranial fossa craniotomy and diffuse hemosiderin staining along the cerebellar folia, surface of brainstem and bilateral sylvian fissure compatible with superficial siderosis or prior subarachnoid hemorrhage but no acute intracranial abnormality.  Neurosurgery believed her symptoms and MRI findings were not surgical.  She was evaluated by ENT on 03/25/18 and found to have fluid in the right middle ear thought to be CSF otorrhea.  Recurrent CSF leak was suspected to be secondary to increased CSF pressure either due to hydrocephalus or benign intracranial hypertension.  She had a lumbar puncture which showed a normal opening pressure of 10 cm water.  She was evaluated by ophthalmology and found to have no evidence of papilledema.  Due to ongoing lightheadedness (no true vertigo) with gait imbalance, she had an MRI of brain with and without contrast on 05/11/2020 personally reviewed which showed unchanged extensive superficial siderosis including the posterior fossa coating the brainstem/supravermian cystern and prior right temporal craniotomy.  For further evaluation of  headache and dizziness, she had repeat MRI of brain with and without contrast on 01/25/2021 which was stable compared  to 2021.  She has continued to be followed by ENT for right otalgia and sensorineural hearing loss, for which she wears hearing aids.  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  Another MRI of brain with and without contrast on 04/03/2022 was stable compared to imaging from 01/2021.  CT temporal bones on 04/22/2022 revealed persistent osseous defect involving the right tegmen tympani but without fluid within the right middle ear or mastoid as well as head of the right malleus appears closely associated with the anterior superior wall of the epitympanum and thickening of the right tympanic membrane (likely sequelae of prior otitis).       PAST MEDICAL HISTORY: 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[place]) 05/18/2020   Coronary artery disease involving native coronary artery of native heart without angina pectoris 10/10/2015   Depression    Dyslipidemia 10/10/2015   Dyspnea 11/21/2019   Essential hypertension 08/28/2016   GERD (gastroesophageal reflux disease)    Glaucoma    Headache above the eye region 11/10/2017   Heart murmur    History of right mastoidectomy 06/29/2020   Hyperlipidemia    Hypertension    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    EYE SURGERY     LEFT HEART CATH AND CORONARY ANGIOGRAPHY N/A 12/28/2017   Procedure: LEFT HEART CATH AND CORONARY ANGIOGRAPHY;  Surgeon: Belva Crome, MD;  Location:  [facility];  Service: Cardiovascular;  Laterality: N/A;   MASTOIDECTOMY     PATENT DUCTUS ARTERIOUS REPAIR     SHOULDER OPEN ROTATOR CUFF REPAIR Bilateral    UPPER GASTROINTESTINAL ENDOSCOPY  10/15/2021    MEDICATIONS: Current Outpatient Medications on File Prior to Visit  Medication Sig Dispense Refill   albuterol (VENTOLIN HFA) 108 (90 Base) MCG/ACT inhaler Inhale 2 puffs into the lungs every 6 (six) hours as needed for wheezing or shortness of breath.     ALPRAZolam (XANAX) 0.25 MG tablet Take 0.25 mg by mouth 2 (two) times daily as needed for anxiety.     aspirin EC 81 MG tablet Take 81 mg by mouth daily.     carvedilol (COREG) 6.25 MG tablet Take 6.25 mg by mouth 2 (two) times daily with a meal.     Coenzyme Q10 (CO Q-10) 200 MG CAPS Take 1 capsule by mouth daily.     dorzolamide (TRUSOPT) 2 % ophthalmic solution Place 1 drop into both eyes 2 (two) times daily.     FLOVENT HFA 110 MCG/ACT inhaler Inhale 1 puff into the lungs 2 (two) times daily.     fluticasone (FLONASE) 50 MCG/ACT nasal spray Place 2 sprays into both nostrils daily.     furosemide (LASIX) 40 MG tablet Take 20 mg by mouth daily.  hydrALAZINE (APRESOLINE) 25 MG tablet Take 12.5 mg by mouth as needed (high blood pressure).     isosorbide mononitrate (IMDUR) 30 MG 24 hr tablet Take 1 tablet (30 mg total) by mouth every morning. 90 tablet 2   linaclotide (LINZESS) 72 MCG capsule Take 72 mcg by mouth daily.     Multiple Vitamin (MULTIVITAMIN) tablet Take 1 tablet by mouth daily.     nitroGLYCERIN (NITROSTAT) 0.4 MG SL tablet DISSOLVE 1 TABLET UNDER THE TONGUE EVERY 5 MINUTES AS NEEDED FOR CHEST PAIN. DO NOT EXCEED A TOTAL OF 3 DOSES IN 15 MINUTES. 25 tablet 11   pantoprazole (PROTONIX) 40 MG tablet Take 1 tablet (40 mg total) by mouth daily. 90 tablet 4   potassium chloride (MICRO-K) 10 MEQ CR capsule Take 10 mEq by mouth every morning.     rosuvastatin (CRESTOR) 10 MG tablet Take 10 mg by mouth daily.      TIMOLOL MALEATE OP Apply 1 drop to eye 2 (two) times daily.     traMADol-acetaminophen (ULTRACET) 37.5-325 MG tablet Take 1-2 tablets by mouth 2 (two) times daily as needed for pain.     No current facility-administered medications on file prior to visit.    ALLERGIES: Allergies  Allergen Reactions   Antihistamines, Chlorpheniramine-Type Anaphylaxis   Ativan [Lorazepam] Swelling   Ceftin [Cefuroxime Axetil]    Meloxicam    Pheneen [Benzalkonium Chloride]    Morphine Palpitations    FAMILY HISTORY: Family History  Problem Relation Age of Onset   Heart attack Mother    Heart attack Sister    Colon cancer Brother    Diabetes Brother    Diabetes Daughter    Stomach cancer Sister    Esophageal cancer Neg Hx     Objective:  *** General: No acute distress.  Patient appears well-groomed.   Head:  Normocephalic/atraumatic Eyes:  fundi examined but not visualized Neck: supple, no paraspinal tenderness, full range of motion Back: No paraspinal tenderness Heart: regular rate and rhythm Lungs: Clear to auscultation bilaterally. Vascular: No carotid bruits. Neurological Exam: Mental status: alert and oriented to person, place, and time, speech fluent and not dysarthric, language intact. Cranial nerves: CN I: not tested CN II: pupils equal, round and reactive to light, visual fields intact CN III, IV, VI:  full range of motion, no nystagmus, no ptosis CN V: facial sensation intact. CN VII: upper and lower face symmetric CN VIII: hearing intact CN IX, X: gag intact, uvula midline CN XI: sternocleidomastoid and trapezius muscles intact CN XII: tongue midline Bulk & Tone: normal, no fasciculations. Motor:  muscle strength 5/5 throughout Sensation:  Pinprick, temperature and vibratory sensation intact. Deep Tendon Reflexes:  2+ throughout,  toes downgoing.   Finger to nose testing:  Without dysmetria.   Heel to shin:  Without dysmetria.   Gait:  Normal station and stride.   Romberg negative.    Thank you for allowing me to take part in the care of this patient.  Metta Clines, DO  CC: ***

## 2022-06-05 ENCOUNTER — Ambulatory Visit: Payer: PPO | Admitting: Neurology

## 2022-06-14 DIAGNOSIS — I700 Atherosclerosis of aorta: Secondary | ICD-10-CM | POA: Diagnosis not present

## 2022-06-14 DIAGNOSIS — R5383 Other fatigue: Secondary | ICD-10-CM | POA: Diagnosis not present

## 2022-06-14 DIAGNOSIS — K219 Gastro-esophageal reflux disease without esophagitis: Secondary | ICD-10-CM | POA: Diagnosis not present

## 2022-06-14 DIAGNOSIS — I10 Essential (primary) hypertension: Secondary | ICD-10-CM | POA: Diagnosis not present

## 2022-06-14 DIAGNOSIS — R9431 Abnormal electrocardiogram [ECG] [EKG]: Secondary | ICD-10-CM | POA: Diagnosis not present

## 2022-06-14 DIAGNOSIS — R0602 Shortness of breath: Secondary | ICD-10-CM | POA: Diagnosis not present

## 2022-06-14 DIAGNOSIS — I2089 Other forms of angina pectoris: Secondary | ICD-10-CM | POA: Diagnosis not present

## 2022-06-14 DIAGNOSIS — J449 Chronic obstructive pulmonary disease, unspecified: Secondary | ICD-10-CM | POA: Diagnosis not present

## 2022-06-14 DIAGNOSIS — R079 Chest pain, unspecified: Secondary | ICD-10-CM | POA: Diagnosis not present

## 2022-06-16 DIAGNOSIS — L249 Irritant contact dermatitis, unspecified cause: Secondary | ICD-10-CM | POA: Diagnosis not present

## 2022-06-19 DIAGNOSIS — R079 Chest pain, unspecified: Secondary | ICD-10-CM | POA: Diagnosis not present

## 2022-06-19 DIAGNOSIS — Z9189 Other specified personal risk factors, not elsewhere classified: Secondary | ICD-10-CM | POA: Diagnosis not present

## 2022-06-19 DIAGNOSIS — Z23 Encounter for immunization: Secondary | ICD-10-CM | POA: Diagnosis not present

## 2022-06-19 DIAGNOSIS — G8929 Other chronic pain: Secondary | ICD-10-CM | POA: Diagnosis not present

## 2022-06-19 DIAGNOSIS — Z6828 Body mass index (BMI) 28.0-28.9, adult: Secondary | ICD-10-CM | POA: Diagnosis not present

## 2022-06-30 DIAGNOSIS — M19012 Primary osteoarthritis, left shoulder: Secondary | ICD-10-CM | POA: Diagnosis not present

## 2022-07-02 DIAGNOSIS — I509 Heart failure, unspecified: Secondary | ICD-10-CM | POA: Diagnosis not present

## 2022-07-02 DIAGNOSIS — E261 Secondary hyperaldosteronism: Secondary | ICD-10-CM | POA: Diagnosis not present

## 2022-07-02 DIAGNOSIS — I110 Hypertensive heart disease with heart failure: Secondary | ICD-10-CM | POA: Diagnosis not present

## 2022-07-04 ENCOUNTER — Telehealth: Payer: Self-pay | Admitting: Cardiology

## 2022-07-04 DIAGNOSIS — H402232 Chronic angle-closure glaucoma, bilateral, moderate stage: Secondary | ICD-10-CM | POA: Diagnosis not present

## 2022-07-04 NOTE — Telephone Encounter (Signed)
Patient states that she hasnt been feeling well at all. She would like speak to the nurse to let her know whats been going on. Please advise

## 2022-07-04 NOTE — Telephone Encounter (Signed)
Called pt regarding message. VM full. Unable to leave message

## 2022-07-07 NOTE — Telephone Encounter (Signed)
Spoke with pt. She reported going to ER 2 weeks ago for chest pain. Her CT was normal. She was told to follow up with her PCP and her cardiologist. She stated that she is having pain when she breathes and feels bad. She would like appt to follow up with Dr. Geraldo Pitter. Note sent to front desk to schedule follow appt. Pt agreed and verbalized understanding.

## 2022-07-08 ENCOUNTER — Encounter: Payer: Self-pay | Admitting: Cardiology

## 2022-07-08 ENCOUNTER — Ambulatory Visit: Payer: PPO | Attending: Cardiology | Admitting: Cardiology

## 2022-07-08 VITALS — BP 138/72 | HR 69 | Ht 63.0 in | Wt 154.6 lb

## 2022-07-08 DIAGNOSIS — R0789 Other chest pain: Secondary | ICD-10-CM | POA: Diagnosis not present

## 2022-07-08 DIAGNOSIS — I2510 Atherosclerotic heart disease of native coronary artery without angina pectoris: Secondary | ICD-10-CM

## 2022-07-08 DIAGNOSIS — E782 Mixed hyperlipidemia: Secondary | ICD-10-CM

## 2022-07-08 DIAGNOSIS — I10 Essential (primary) hypertension: Secondary | ICD-10-CM | POA: Diagnosis not present

## 2022-07-08 MED ORDER — CELECOXIB 100 MG PO CAPS: 100.0000 mg | ORAL_CAPSULE | Freq: Two times a day (BID) | ORAL | 1 refills | Status: DC

## 2022-07-08 NOTE — Progress Notes (Signed)
Cardiology Office Note:    Date:  07/08/2022   ID:  Sheila [name], DOB 09-10-1940, MRN [id number]  PCP:  Mateo Flow, MD  Cardiologist:  Shirlee More, MD    Referring MD: Mateo Flow, MD    ASSESSMENT:    1. Costochondral chest pain   2. Mild CAD   3. Essential hypertension   4. Mixed hyperlipidemia    PLAN:    In order of problems listed above:  Although she has mild CAD she is having typical costochondral pain syndrome reproducible on physical exam I will place her on a nonsteroidal anti-inflammatory drug Celebrex for 2 weeks and if unimproved refer to physical therapy modalities. She has mild CAD angiographically and will continue medical therapy including aspirin beta-blocker and her high intensity statin And her blood pressure is at target now she is taking balanced vasodilator hydralazine and isosorbide. Continue her high intensity statin   Next appointment: 6 months   Medication Adjustments/Labs and Tests Ordered: Current medicines are reviewed at length with the patient today.  Concerns regarding medicines are outlined above.  No orders of the defined types were placed in this encounter.  No orders of the defined types were placed in this encounter.   Chief complaint I am having chest pain   History of Present Illness:    Sheila [name] is a 81 y.o. female with a hx of hypertension hyperlipidemia and mild aortic regurgitation.  Her chart says she has coronary artery disease.  She was last seen 03/21/2022.  Compliance with diet, lifestyle and medications: Yes  She recognizes may had seen her during her index hospitalization prior to left heart catheterization. She recently is in the emergency room her blood pressure is quite elevated greater than 458 systolic meds were adjusted now her blood pressure runs 120-130/70 at home She is having point localized tenderness along the right and left costochondral junction she does personal-care with lifting.  Its  not exertional in nature not relieved with rest or nitroglycerin. No edema shortness of breath palpitation or syncope She is being seen by neurology tomorrow for unsteady gait and dizziness  Reviewing her chart I find that she had a left heart catheterization 12/28/2017 with mild nonobstructive CAD 25 to 30% ostial LAD. 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   Chronic obstructive asthma 05/18/2020   Coronary artery disease involving native coronary artery of native heart without angina pectoris 10/10/2015   Depression    Dyslipidemia 10/10/2015   Dyspnea 11/21/2019   Essential hypertension 08/28/2016   GERD (gastroesophageal reflux disease)    Glaucoma    Headache above the eye region 11/10/2017   Heart murmur    History of right mastoidectomy 06/29/2020   Hyperlipidemia    Hypertension    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    EYE SURGERY     LEFT HEART CATH AND CORONARY ANGIOGRAPHY N/A 12/28/2017   Procedure:  LEFT HEART CATH AND CORONARY ANGIOGRAPHY;  Surgeon: Belva Crome, MD;  Location: [facility];  Service: Cardiovascular;  Laterality: N/A;   MASTOIDECTOMY     PATENT DUCTUS ARTERIOUS REPAIR     SHOULDER OPEN ROTATOR CUFF REPAIR Bilateral    UPPER GASTROINTESTINAL ENDOSCOPY  10/15/2021    Current Medications: Current Meds  Medication Sig   albuterol (VENTOLIN HFA) 108 (90 Base) MCG/ACT inhaler Inhale 2 puffs into the lungs every 6 (six) hours as needed for  wheezing or shortness of breath.   ALPRAZolam (XANAX) 0.25 MG tablet Take 0.25 mg by mouth 2 (two) times daily as needed for anxiety.   aspirin EC 81 MG tablet Take 81 mg by mouth daily.   carvedilol (COREG) 6.25 MG tablet Take 6.25 mg by mouth 2 (two) times daily with a meal.   Coenzyme Q10 (CO Q-10) 200 MG CAPS Take 1 capsule by mouth daily.   dorzolamide (TRUSOPT) 2 % ophthalmic solution Place 1 drop into both eyes 2 (two) times daily.   FLOVENT HFA 110 MCG/ACT inhaler Inhale 1 puff into the lungs 2 (two) times daily.   fluticasone (FLONASE) 50 MCG/ACT nasal spray Place 2 sprays into both nostrils daily.   furosemide (LASIX) 40 MG tablet Take 20 mg by mouth daily.   hydrALAZINE (APRESOLINE) 25 MG tablet Take 12.5 mg by mouth as needed (high blood pressure).   isosorbide mononitrate (IMDUR) 60 MG 24 hr tablet Take 60 mg by mouth daily.   linaclotide (LINZESS) 72 MCG capsule Take 72 mcg by mouth daily.   Multiple Vitamin (MULTIVITAMIN) tablet Take 1 tablet by mouth daily.   nitroGLYCERIN (NITROSTAT) 0.4 MG SL tablet DISSOLVE 1 TABLET UNDER THE TONGUE EVERY 5 MINUTES AS NEEDED FOR CHEST PAIN. DO NOT EXCEED A TOTAL OF 3 DOSES IN 15 MINUTES.   pantoprazole (PROTONIX) 40 MG tablet Take 1 tablet (40 mg total) by mouth daily.   potassium chloride (MICRO-K) 10 MEQ CR capsule Take 10 mEq by mouth every morning.   rosuvastatin (CRESTOR) 10 MG tablet Take 10 mg by mouth daily.   TIMOLOL MALEATE OP Apply 1 drop to eye 2 (two) times daily.   traMADol-acetaminophen (ULTRACET) 37.5-325 MG tablet Take 1-2 tablets by mouth 2 (two) times daily as needed for pain.     Allergies:   Antihistamines, chlorpheniramine-type; Ativan [lorazepam]; Ceftin [cefuroxime axetil]; Meloxicam; Pheneen [benzalkonium chloride]; and Morphine   Social History   Socioeconomic History   Marital status: Divorced    Spouse name: Not on file   Number of children: 5   Years of education: Not on file   Highest education level: Not  on file  Occupational History   Occupation: Retired   Tobacco Use   Smoking status: Never   Smokeless tobacco: Never  Vaping Use   Vaping Use: Never used  Substance and Sexual Activity   Alcohol use: No   Drug use: Never   Sexual activity: Not Currently    Birth control/protection: Post-menopausal  Other Topics Concern   Not on file  Social History Narrative   Not on file   Social Determinants of Health   Financial Resource Strain: Not on file  Food Insecurity: Not on file  Transportation Needs: Not on file  Physical Activity: Not on file  Stress: Not on file  Social Connections: Not on file     Family History: The patient's family history includes Colon cancer in her brother; Diabetes in her brother and daughter; Heart attack in her  mother and sister; Stomach cancer in her sister. There is no history of Esophageal cancer. ROS:   Please see the history of present illness.    All other systems reviewed and are negative.  EKGs/Labs/Other Studies Reviewed:    The following studies were reviewed today:  EKG:  EKG ordered today and personally reviewed.  The ekg ordered today demonstrates sinus rhythm right and left atrial enlargement marked LVH and repolarization pattern unchanged from the EKG July of this year  Recent Labs: 04/10/2022 creatinine 0.8 potassium 4.1 hemoglobin 12.5 A1c 6.1.  Physical Exam:    VS:  BP 138/72 (BP Location: Right Arm, Patient Position: Sitting)   Pulse 69   Ht '5\' 3"'$  (1.6 m)   Wt 154 lb 9.6 oz (70.1 kg)   SpO2 98%   BMI 27.39 kg/m     Wt Readings from Last 3 Encounters:  07/08/22 154 lb 9.6 oz (70.1 kg)  03/21/22 153 lb 12.8 oz (69.8 kg)  02/18/22 152 lb 6.4 oz (69.1 kg)     GEN:  Well nourished, well developed in no acute distress HEENT: Normal NECK: No JVD; No carotid bruits LYMPHATICS: No lymphadenopathy CARDIAC: Tenderness costochondral junction bilaterally reproduces her chest pain syndrome RRR, no murmurs, rubs,  gallops RESPIRATORY:  Clear to auscultation without rales, wheezing or rhonchi  ABDOMEN: Soft, non-tender, non-distended MUSCULOSKELETAL:  No edema; No deformity  SKIN: Warm and dry NEUROLOGIC:  Alert and oriented x 3 PSYCHIATRIC:  Normal affect    Signed, Shirlee More, MD  07/08/2022 3:28 PM    [facility]

## 2022-07-08 NOTE — Patient Instructions (Signed)
Medication Instructions:  Your physician has recommended you make the following change in your medication:  Sent in Celebrex 100 mg to be taken two times daily If unimproved in 2 weeks let Dr. Bettina Gavia know and he will sent to Physical Therapy  *If you need a refill on your cardiac medications before your next appointment, please call your pharmacy*   Lab Work: NONE If you have labs (blood work) drawn today and your tests are completely normal, you will receive your results only by: [place] (if you have MyChart) OR A paper copy in the mail If you have any lab test that is abnormal or we need to change your treatment, we will call you to review the results.   Testing/Procedures: NONE   Follow-Up: At [facility], you and your health needs are our priority.  As part of our continuing mission to provide you with exceptional heart care, we have created designated Provider Care Teams.  These Care Teams include your primary Cardiologist (physician) and Advanced Practice Providers (APPs -  Physician Assistants and Nurse Practitioners) who all work together to provide you with the care you need, when you need it.  We recommend signing up for the patient portal called "MyChart".  Sign up information is provided on this After Visit Summary.  MyChart is used to connect with patients for Virtual Visits (Telemedicine).  Patients are able to view lab/test results, encounter notes, upcoming appointments, etc.  Non-urgent messages can be sent to your provider as well.   To learn more about what you can do with MyChart, go to [email or web address].    Your next appointment:   6 month(s)  The format for your next appointment:   In Person  Provider:   Shirlee More, MD    Other Instructions   Important Information About Sugar

## 2022-07-08 NOTE — Addendum Note (Signed)
Addended by: Jerl Santos R on: 07/08/2022 03:40 PM   Modules accepted: Orders

## 2022-07-09 DIAGNOSIS — R419 Unspecified symptoms and signs involving cognitive functions and awareness: Secondary | ICD-10-CM | POA: Diagnosis not present

## 2022-07-09 DIAGNOSIS — R110 Nausea: Secondary | ICD-10-CM | POA: Diagnosis not present

## 2022-07-09 DIAGNOSIS — Z79899 Other long term (current) drug therapy: Secondary | ICD-10-CM | POA: Diagnosis not present

## 2022-07-09 DIAGNOSIS — H93A3 Pulsatile tinnitus, bilateral: Secondary | ICD-10-CM | POA: Diagnosis not present

## 2022-07-09 DIAGNOSIS — R519 Headache, unspecified: Secondary | ICD-10-CM | POA: Diagnosis not present

## 2022-07-09 DIAGNOSIS — G932 Benign intracranial hypertension: Secondary | ICD-10-CM | POA: Diagnosis not present

## 2022-07-09 DIAGNOSIS — R2681 Unsteadiness on feet: Secondary | ICD-10-CM | POA: Diagnosis not present

## 2022-07-10 DIAGNOSIS — I509 Heart failure, unspecified: Secondary | ICD-10-CM | POA: Diagnosis not present

## 2022-07-10 DIAGNOSIS — K219 Gastro-esophageal reflux disease without esophagitis: Secondary | ICD-10-CM | POA: Diagnosis not present

## 2022-07-10 DIAGNOSIS — Z6827 Body mass index (BMI) 27.0-27.9, adult: Secondary | ICD-10-CM | POA: Diagnosis not present

## 2022-07-10 DIAGNOSIS — E261 Secondary hyperaldosteronism: Secondary | ICD-10-CM | POA: Diagnosis not present

## 2022-07-10 DIAGNOSIS — I110 Hypertensive heart disease with heart failure: Secondary | ICD-10-CM | POA: Diagnosis not present

## 2022-07-16 ENCOUNTER — Telehealth: Payer: Self-pay | Admitting: Cardiology

## 2022-07-16 NOTE — Telephone Encounter (Signed)
Pt c/o medication issue:  1. Name of Medication:  furosemide (LASIX) 40 MG tablet    2. How are you currently taking this medication (dosage and times per day)? Not sure   3. Are you having a reaction (difficulty breathing--STAT)?   4. What is your medication issue? Pharmacy calling to clarify how pt is taking this medication

## 2022-07-16 NOTE — Telephone Encounter (Signed)
Spoke with pharmacy- Pharmacy stated that the pt has not had Lasix filled in a few months but continues to take K+. When asked if she was taking Lasix she did not give a clear answer. Review of her chart and last visit did not mention anything about changing or stopping Lasix. Will make a note to check with pt at her next appt.

## 2022-07-29 ENCOUNTER — Other Ambulatory Visit: Payer: Self-pay | Admitting: Cardiology

## 2022-08-25 DIAGNOSIS — G9600 Cerebrospinal fluid leak, unspecified: Secondary | ICD-10-CM | POA: Diagnosis not present

## 2022-08-29 DIAGNOSIS — I10 Essential (primary) hypertension: Secondary | ICD-10-CM | POA: Diagnosis not present

## 2022-08-29 DIAGNOSIS — R42 Dizziness and giddiness: Secondary | ICD-10-CM | POA: Diagnosis not present

## 2022-09-15 ENCOUNTER — Telehealth: Payer: Self-pay | Admitting: Cardiology

## 2022-09-15 DIAGNOSIS — R0602 Shortness of breath: Secondary | ICD-10-CM

## 2022-09-15 NOTE — Telephone Encounter (Signed)
Called patient and she reported that she has been progressively becoming more short of breath with any exertion. Patient reports that when she becomes short of breath she will have chest pains that are relieved with rest. She also has noticed that she has been light headed and "off balance" more than not. However, patient is currently not short of breath or having chest pains at this time. Please advise.

## 2022-09-15 NOTE — Telephone Encounter (Signed)
Pt c/o Shortness Of Breath: STAT if SOB developed within the last 24 hours or pt is noticeably SOB on the phone  1. Are you currently SOB (can you hear that pt is SOB on the phone)? No  2. How long have you been experiencing SOB?  Few months but getting worse   3. Are you SOB when sitting or when up moving around? Walking and also when laying down  4. Are you currently experiencing any other symptoms? Lightheadedness and off balance

## 2022-09-16 NOTE — Telephone Encounter (Signed)
Attempted to call patient. She did not answer the phone and her voice mailbox was full so unable to leave a message for her to call back.

## 2022-09-17 NOTE — Telephone Encounter (Signed)
Attempted to call patient. She did not answer the phone and her voice mailbox was full so unable to leave a message for her to call back.

## 2022-09-18 DIAGNOSIS — H524 Presbyopia: Secondary | ICD-10-CM | POA: Diagnosis not present

## 2022-09-18 NOTE — Telephone Encounter (Signed)
Attempted to call patient. She did not answer the phone and her voice mailbox was full so unable to leave a message for her to call back.

## 2022-09-18 NOTE — Telephone Encounter (Signed)
Attempted to call the patient's home phone number and she did not answer and was unable to leave a message due to voice mail not being set-up.

## 2022-09-19 NOTE — Telephone Encounter (Signed)
Attempted to call patient. She did not answer the phone and her voice mailbox was full so unable to leave a message for her to call back.

## 2022-09-23 NOTE — Telephone Encounter (Signed)
Patient is returning call. Please advise? 

## 2022-09-23 NOTE — Telephone Encounter (Signed)
Recommendations reviewed with pt as per Dr. Krasowski's note.  Pt verbalized understanding and had no additional questions.  

## 2022-09-23 NOTE — Telephone Encounter (Signed)
Attempted to call patient. She did not answer the phone and her voice mailbox was full so unable to leave a message for her to call back.

## 2022-09-23 NOTE — Telephone Encounter (Signed)
Called and left a message with the alternate contact on the patient's chart to have her contact the patient and call us back.

## 2022-09-24 DIAGNOSIS — M19011 Primary osteoarthritis, right shoulder: Secondary | ICD-10-CM | POA: Diagnosis not present

## 2022-09-24 DIAGNOSIS — R0602 Shortness of breath: Secondary | ICD-10-CM | POA: Diagnosis not present

## 2022-09-24 DIAGNOSIS — Z23 Encounter for immunization: Secondary | ICD-10-CM | POA: Diagnosis not present

## 2022-09-25 LAB — BASIC METABOLIC PANEL
BUN/Creatinine Ratio: 15 (ref 12–28)
BUN: 11 mg/dL (ref 8–27)
CO2: 26 mmol/L (ref 20–29)
Calcium: 9.9 mg/dL (ref 8.7–10.3)
Chloride: 100 mmol/L (ref 96–106)
Creatinine, Ser: 0.72 mg/dL (ref 0.57–1.00)
Glucose: 87 mg/dL (ref 70–99)
Potassium: 4.4 mmol/L (ref 3.5–5.2)
Sodium: 140 mmol/L (ref 134–144)
eGFR: 84 mL/min/{1.73_m2} (ref >59)

## 2022-09-25 LAB — PRO B NATRIURETIC PEPTIDE: NT-Pro BNP: 235 pg/mL (ref 0–738)

## 2022-09-30 ENCOUNTER — Telehealth: Payer: Self-pay | Admitting: Cardiology

## 2022-09-30 DIAGNOSIS — Z6827 Body mass index (BMI) 27.0-27.9, adult: Secondary | ICD-10-CM | POA: Diagnosis not present

## 2022-09-30 DIAGNOSIS — R0609 Other forms of dyspnea: Secondary | ICD-10-CM | POA: Diagnosis not present

## 2022-09-30 DIAGNOSIS — R0789 Other chest pain: Secondary | ICD-10-CM | POA: Diagnosis not present

## 2022-09-30 DIAGNOSIS — G8929 Other chronic pain: Secondary | ICD-10-CM | POA: Diagnosis not present

## 2022-09-30 NOTE — Telephone Encounter (Signed)
  Patient is returning call regarding results

## 2022-09-30 NOTE — Telephone Encounter (Signed)
Left vm on home phone to callback.

## 2022-09-30 NOTE — Telephone Encounter (Signed)
PT reports that she continues to be lightheaded. Dr. Humphrey Rolls mentioned that she had not had a echo since 09/2018 and wasn't sure if something she might need. Please advise.

## 2022-10-01 NOTE — Addendum Note (Signed)
Addended by: Jyl Heinz R on: 10/01/2022 05:01 PM   Modules accepted: Orders

## 2022-10-01 NOTE — Telephone Encounter (Signed)
Results reviewed with pt as per Dr. Revankar's note.  Pt verbalized understanding and had no additional questions. Routed to PCP.  

## 2022-10-01 NOTE — Addendum Note (Signed)
Addended by: Truddie Hidden on: 10/01/2022 04:33 PM   Modules accepted: Orders

## 2022-10-01 NOTE — Telephone Encounter (Signed)
Instructions for stress test and echo reviewed and mailed to pt. Pt verbalized understanding and had no additional questions.

## 2022-10-01 NOTE — Telephone Encounter (Signed)
Full vm

## 2022-10-08 ENCOUNTER — Telehealth (HOSPITAL_COMMUNITY): Payer: Self-pay | Admitting: *Deleted

## 2022-10-08 NOTE — Telephone Encounter (Signed)
Left message on voicemail per DPR in reference to upcoming appointment scheduled on 10/16/2022 at 11:00 with detailed instructions given per Myocardial Perfusion Study Information Sheet for the test. LM to arrive 15 minutes early, and that it is imperative to arrive on time for appointment to keep from having the test rescheduled. If you need to cancel or reschedule your appointment, please call the office within 24 hours of your appointment. Failure to do so may result in a cancellation of your appointment, and a $50 no show fee. Phone number given for call back for any questions.

## 2022-10-11 DIAGNOSIS — R519 Headache, unspecified: Secondary | ICD-10-CM | POA: Diagnosis not present

## 2022-10-11 DIAGNOSIS — J069 Acute upper respiratory infection, unspecified: Secondary | ICD-10-CM | POA: Diagnosis not present

## 2022-10-14 ENCOUNTER — Telehealth: Payer: Self-pay

## 2022-10-14 NOTE — Telephone Encounter (Signed)
Spoke with the patient, detailed instructions given. She stated that she will be here for her test. Asked to call back with any questions. S.Sharen Youngren EMTP/CCT

## 2022-10-15 DIAGNOSIS — H402232 Chronic angle-closure glaucoma, bilateral, moderate stage: Secondary | ICD-10-CM | POA: Diagnosis not present

## 2022-10-16 ENCOUNTER — Ambulatory Visit: Payer: PPO | Attending: Cardiology

## 2022-10-16 ENCOUNTER — Ambulatory Visit (INDEPENDENT_AMBULATORY_CARE_PROVIDER_SITE_OTHER): Payer: PPO

## 2022-10-16 DIAGNOSIS — R0602 Shortness of breath: Secondary | ICD-10-CM

## 2022-10-16 LAB — ECHOCARDIOGRAM COMPLETE
AR max vel: 1.22 cm2
AV Area VTI: 1.13 cm2
AV Area mean vel: 1.17 cm2
AV Mean grad: 12 mmHg
AV Peak grad: 19.7 mmHg
Ao pk vel: 2.22 m/s
Area-P 1/2: 3.12 cm2
P 1/2 time: 509 msec
S' Lateral: 2.8 cm

## 2022-10-16 MED ORDER — PERFLUTREN LIPID MICROSPHERE
1.0000 mL | INTRAVENOUS | Status: AC | PRN
  Administered 2022-10-16: 4 mL via INTRAVENOUS

## 2022-10-16 MED ORDER — TECHNETIUM TC 99M TETROFOSMIN IV KIT
10.1000 | PACK | Freq: Once | INTRAVENOUS | Status: AC | PRN
  Administered 2022-10-16: 10.1 via INTRAVENOUS

## 2022-10-16 MED ORDER — TECHNETIUM TC 99M TETROFOSMIN IV KIT
32.7000 | PACK | Freq: Once | INTRAVENOUS | Status: AC | PRN
  Administered 2022-10-16: 32.7 via INTRAVENOUS

## 2022-10-16 MED ORDER — REGADENOSON 0.4 MG/5ML IV SOLN
0.4000 mg | Freq: Once | INTRAVENOUS | Status: AC
  Administered 2022-10-16: 0.4 mg via INTRAVENOUS

## 2022-10-16 NOTE — Progress Notes (Signed)
Exam observed by Kalie Lambeth. 

## 2022-10-18 LAB — MYOCARDIAL PERFUSION IMAGING
LV dias vol: 71 mL (ref 46–106)
LV sys vol: 28 mL
Nuc Stress EF: 69 %
Peak HR: 88 {beats}/min
Rest HR: 55 {beats}/min
Rest Nuclear Isotope Dose: 10.1 mCi
SDS: 1
SRS: 0
SSS: 1
ST Depression (mm): 0 mm
Stress Nuclear Isotope Dose: 32.7 mCi
TID: 1.01

## 2022-10-22 ENCOUNTER — Ambulatory Visit: Payer: PPO | Admitting: Neurology

## 2022-10-23 DIAGNOSIS — R0789 Other chest pain: Secondary | ICD-10-CM | POA: Diagnosis not present

## 2022-10-23 DIAGNOSIS — G8929 Other chronic pain: Secondary | ICD-10-CM | POA: Diagnosis not present

## 2022-10-23 DIAGNOSIS — K219 Gastro-esophageal reflux disease without esophagitis: Secondary | ICD-10-CM | POA: Diagnosis not present

## 2022-10-23 DIAGNOSIS — Z6828 Body mass index (BMI) 28.0-28.9, adult: Secondary | ICD-10-CM | POA: Diagnosis not present

## 2022-10-31 DIAGNOSIS — J111 Influenza due to unidentified influenza virus with other respiratory manifestations: Secondary | ICD-10-CM | POA: Diagnosis not present

## 2022-10-31 DIAGNOSIS — R0981 Nasal congestion: Secondary | ICD-10-CM | POA: Diagnosis not present

## 2022-11-04 DIAGNOSIS — R051 Acute cough: Secondary | ICD-10-CM | POA: Diagnosis not present

## 2022-11-04 DIAGNOSIS — J0100 Acute maxillary sinusitis, unspecified: Secondary | ICD-10-CM | POA: Diagnosis not present

## 2022-11-04 DIAGNOSIS — J209 Acute bronchitis, unspecified: Secondary | ICD-10-CM | POA: Diagnosis not present

## 2022-11-05 DIAGNOSIS — J111 Influenza due to unidentified influenza virus with other respiratory manifestations: Secondary | ICD-10-CM | POA: Diagnosis not present

## 2022-11-05 DIAGNOSIS — R059 Cough, unspecified: Secondary | ICD-10-CM | POA: Diagnosis not present

## 2022-11-07 DIAGNOSIS — Z6827 Body mass index (BMI) 27.0-27.9, adult: Secondary | ICD-10-CM | POA: Diagnosis not present

## 2022-11-07 DIAGNOSIS — J111 Influenza due to unidentified influenza virus with other respiratory manifestations: Secondary | ICD-10-CM | POA: Diagnosis not present

## 2022-11-17 DIAGNOSIS — R0789 Other chest pain: Secondary | ICD-10-CM | POA: Diagnosis not present

## 2022-11-17 DIAGNOSIS — I2510 Atherosclerotic heart disease of native coronary artery without angina pectoris: Secondary | ICD-10-CM | POA: Diagnosis not present

## 2022-11-17 DIAGNOSIS — Z8619 Personal history of other infectious and parasitic diseases: Secondary | ICD-10-CM | POA: Diagnosis not present

## 2022-11-18 DIAGNOSIS — Z6827 Body mass index (BMI) 27.0-27.9, adult: Secondary | ICD-10-CM | POA: Diagnosis not present

## 2022-11-18 DIAGNOSIS — K219 Gastro-esophageal reflux disease without esophagitis: Secondary | ICD-10-CM | POA: Diagnosis not present

## 2022-11-18 DIAGNOSIS — R1310 Dysphagia, unspecified: Secondary | ICD-10-CM | POA: Diagnosis not present

## 2022-11-18 DIAGNOSIS — H6120 Impacted cerumen, unspecified ear: Secondary | ICD-10-CM | POA: Diagnosis not present

## 2022-11-27 DIAGNOSIS — R059 Cough, unspecified: Secondary | ICD-10-CM | POA: Diagnosis not present

## 2022-11-28 ENCOUNTER — Other Ambulatory Visit: Payer: Self-pay

## 2022-11-28 DIAGNOSIS — H6991 Unspecified Eustachian tube disorder, right ear: Secondary | ICD-10-CM

## 2022-11-28 DIAGNOSIS — E785 Hyperlipidemia, unspecified: Secondary | ICD-10-CM

## 2022-11-28 DIAGNOSIS — H90A22 Sensorineural hearing loss, unilateral, left ear, with restricted hearing on the contralateral side: Secondary | ICD-10-CM

## 2022-11-28 HISTORY — DX: Unspecified eustachian tube disorder, right ear: H69.91

## 2022-11-28 HISTORY — DX: Hyperlipidemia, unspecified: E78.5

## 2022-11-28 HISTORY — DX: Sensorineural hearing loss, unilateral, left ear, with restricted hearing on the contralateral side: H90.A22

## 2022-12-04 ENCOUNTER — Ambulatory Visit: Payer: PPO | Attending: Cardiology | Admitting: Cardiology

## 2022-12-04 ENCOUNTER — Encounter: Payer: Self-pay | Admitting: Cardiology

## 2022-12-04 VITALS — BP 134/60 | HR 68 | Ht 63.0 in | Wt 153.0 lb

## 2022-12-04 DIAGNOSIS — I10 Essential (primary) hypertension: Secondary | ICD-10-CM

## 2022-12-04 DIAGNOSIS — I2510 Atherosclerotic heart disease of native coronary artery without angina pectoris: Secondary | ICD-10-CM | POA: Diagnosis not present

## 2022-12-04 DIAGNOSIS — E782 Mixed hyperlipidemia: Secondary | ICD-10-CM | POA: Diagnosis not present

## 2022-12-04 NOTE — Patient Instructions (Signed)
Medication Instructions:  Your physician recommends that you continue on your current medications as directed. Please refer to the Current Medication list given to you today.  *If you need a refill on your cardiac medications before your next appointment, please call your pharmacy*   Lab Work: None ordered If you have labs (blood work) drawn today and your tests are completely normal, you will receive your results only by: MyChart Message (if you have MyChart) OR A paper copy in the mail If you have any lab test that is abnormal or we need to change your treatment, we will call you to review the results.   Testing/Procedures: None ordered   Follow-Up: At [facility], you and your health needs are our priority.  As part of our continuing mission to provide you with exceptional heart care, we have created designated Provider Care Teams.  These Care Teams include your primary Cardiologist (physician) and Advanced Practice Providers (APPs -  Physician Assistants and Nurse Practitioners) who all work together to provide you with the care you need, when you need it.  We recommend signing up for the patient portal called "MyChart".  Sign up information is provided on this After Visit Summary.  MyChart is used to connect with patients for Virtual Visits (Telemedicine).  Patients are able to view lab/test results, encounter notes, upcoming appointments, etc.  Non-urgent messages can be sent to your provider as well.   To learn more about what you can do with MyChart, go to [email or web address].    Your next appointment:   9 month(s)  The format for your next appointment:   In Person  Provider:   Rajan Revankar, MD    Other Instructions none  Important Information About Sugar       

## 2022-12-04 NOTE — Progress Notes (Signed)
Cardiology Office Note:    Date:  12/04/2022   ID:  Sheila [name], DOB 04-11-1941, MRN [id number]  PCP:  Mateo Flow, MD  Cardiologist:  Jenean Lindau, MD   Referring MD: Mateo Flow, MD    ASSESSMENT:    1. Coronary artery disease involving native coronary artery of native heart without angina pectoris   2. Essential hypertension   3. Mixed hyperlipidemia    PLAN:    In order of problems listed above:  Mild nonobstructive coronary artery disease: Recent negative stress test: Patient's chest pain is atypical in nature.  It does not occur with exertion.  I reassured her about my findings.  She is going to see her primary care and gastroenterology for evaluation also. Essential hypertension: Blood pressure stable and diet was emphasized.  Lifestyle modification urged. Mixed dyslipidemia: On lipid-lowering medications: These are followed by primary care. Patient will be seen in follow-up appointment in 6 months or earlier if the patient has any concerns.    Medication Adjustments/Labs and Tests Ordered: Current medicines are reviewed at length with the patient today.  Concerns regarding medicines are outlined above.  No orders of the defined types were placed in this encounter.  No orders of the defined types were placed in this encounter.    No chief complaint on file.    History of Present Illness:    Sheila [name] is a 82 y.o. female.  Patient has past medical history of coronary artery disease, essential hypertension, mixed dyslipidemia.  She denies any problems at this time and takes care of activities of daily living.  No chest pain orthopnea or PND.  She occasionally has chest pain like episodes.  She tells me that do not occur with exertion.  She had a stress test about this recently in January and it was fine.  At the time of my evaluation, the patient is alert awake oriented and in no distress.  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   Coronary artery disease involving native coronary artery of native heart without angina pectoris 10/10/2015   Depression    Dyslipidemia 10/10/2015   Dyspnea 11/21/2019   Essential hypertension 08/28/2016   Eustachian tube dysfunction, right 11/28/2022   GERD (gastroesophageal reflux disease)    Glaucoma    Headache above the eye region 11/10/2017   Heart murmur    History of right mastoidectomy 06/29/2020   Hyperlipemia 11/28/2022   Hyperlipidemia    Hypertension    Imbalance 04/17/2022   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    EYE SURGERY     LEFT HEART CATH AND CORONARY ANGIOGRAPHY N/A 12/28/2017   Procedure: LEFT HEART CATH AND CORONARY ANGIOGRAPHY;  Surgeon:  Belva Crome, MD;  Location: [facility];  Service: Cardiovascular;  Laterality: N/A;   MASTOIDECTOMY     PATENT DUCTUS ARTERIOUS REPAIR     SHOULDER OPEN ROTATOR CUFF REPAIR Bilateral    UPPER GASTROINTESTINAL ENDOSCOPY  10/15/2021    Current Medications: Current Meds  Medication Sig   albuterol (VENTOLIN HFA) 108 (90 Base) MCG/ACT inhaler Inhale 2 puffs into the lungs every 6 (six) hours as needed for wheezing or shortness of breath.   ALPRAZolam (XANAX) 0.25 MG tablet Take 0.25 mg by mouth 2 (two)  times daily as needed for anxiety.   aspirin EC 81 MG tablet Take 81 mg by mouth daily.   carvedilol (COREG) 6.25 MG tablet Take 6.25 mg by mouth 2 (two) times daily with a meal.   celecoxib (CELEBREX) 100 MG capsule Take 1 capsule (100 mg total) by mouth 2 (two) times daily.   Coenzyme Q10 (CO Q-10) 200 MG CAPS Take 1 capsule by mouth daily.   dorzolamide (TRUSOPT) 2 % ophthalmic solution Place 1 drop into both eyes 2 (two) times daily.   FLOVENT HFA 110 MCG/ACT inhaler Inhale 1 puff into the lungs 2 (two) times daily.   fluticasone (FLONASE) 50 MCG/ACT nasal spray Place 2 sprays into both nostrils daily.   furosemide (LASIX) 40 MG tablet Take 20 mg by mouth daily.   hydrALAZINE (APRESOLINE) 25 MG tablet Take 12.5 mg by mouth as needed (high blood pressure).   isosorbide mononitrate (IMDUR) 30 MG 24 hr tablet Take 30 mg by mouth every morning.   latanoprost (XALATAN) 0.005 % ophthalmic solution Place 1 drop into both eyes at bedtime.   linaclotide (LINZESS) 72 MCG capsule Take 72 mcg by mouth daily.   Multiple Vitamin (MULTIVITAMIN) tablet Take 1 tablet by mouth daily.   nitroGLYCERIN (NITROSTAT) 0.4 MG SL tablet DISSOLVE 1 TABLET UNDER THE TONGUE EVERY 5 MINUTES AS NEEDED FOR CHEST PAIN. DO NOT EXCEED A TOTAL OF 3 DOSES IN 15 MINUTES.   pantoprazole (PROTONIX) 40 MG tablet Take 1 tablet (40 mg total) by mouth daily.   potassium chloride (MICRO-K) 10 MEQ CR capsule Take 10 mEq by mouth every morning.   rosuvastatin (CRESTOR) 10 MG tablet Take 10 mg by mouth daily.   sucralfate (CARAFATE) 1 g tablet Take 1 g by mouth daily.   TIMOLOL MALEATE OP Apply 1 drop to eye 2 (two) times daily.   traMADol-acetaminophen (ULTRACET) 37.5-325 MG tablet Take 1-2 tablets by mouth 2 (two) times daily as needed for pain.     Allergies:   Antihistamines, chlorpheniramine-type; Ativan [lorazepam]; Ceftin [cefuroxime axetil]; Meloxicam; Pheneen [benzalkonium chloride]; and Morphine   Social History    Socioeconomic History   Marital status: Divorced    Spouse name: Not on file   Number of children: 5   Years of education: Not on file   Highest education level: Not on file  Occupational History   Occupation: Retired   Tobacco Use   Smoking status: Never   Smokeless tobacco: Never  Vaping Use   Vaping Use: Never used  Substance and Sexual Activity   Alcohol use: No   Drug use: Never   Sexual activity: Not Currently    Birth control/protection: Post-menopausal  Other Topics Concern   Not on file  Social History Narrative   Not on file   Social Determinants of Health   Financial Resource Strain: Not on file  Food Insecurity: Not on file  Transportation Needs: Not on file  Physical  Activity: Not on file  Stress: Not on file  Social Connections: Not on file     Family History: The patient's family history includes Colon cancer in her brother; Diabetes in her brother and daughter; Heart attack in her mother and sister; Stomach cancer in her sister. There is no history of Esophageal cancer.  ROS:   Please see the history of present illness.    All other systems reviewed and are negative.  EKGs/Labs/Other Studies Reviewed:    The following studies were reviewed today: I discussed my findings with the patient at length.   Recent Labs: 09/24/2022: BUN 11; Creatinine, Ser 0.72; NT-Pro BNP 235; Potassium 4.4; Sodium 140  Recent Lipid Panel No results found for: "CHOL", "TRIG", "HDL", "CHOLHDL", "VLDL", "LDLCALC", "LDLDIRECT"  Physical Exam:    VS:  BP 134/60   Pulse 68   Ht 5\' 3"  (1.6 m)   Wt 153 lb (69.4 kg)   SpO2 96%   BMI 27.10 kg/m     Wt Readings from Last 3 Encounters:  12/04/22 153 lb (69.4 kg)  10/16/22 154 lb (69.9 kg)  07/08/22 154 lb 9.6 oz (70.1 kg)     GEN: Patient is in no acute distress HEENT: Normal NECK: No JVD; No carotid bruits LYMPHATICS: No lymphadenopathy CARDIAC: Hear sounds regular, 2/6 systolic murmur at the apex. RESPIRATORY:   Clear to auscultation without rales, wheezing or rhonchi  ABDOMEN: Soft, non-tender, non-distended MUSCULOSKELETAL:  No edema; No deformity  SKIN: Warm and dry NEUROLOGIC:  Alert and oriented x 3 PSYCHIATRIC:  Normal affect   Signed, Jenean Lindau, MD  12/04/2022 3:11 PM    [facility]

## 2022-12-08 ENCOUNTER — Telehealth: Payer: Self-pay | Admitting: Cardiology

## 2022-12-08 DIAGNOSIS — R072 Precordial pain: Secondary | ICD-10-CM

## 2022-12-08 DIAGNOSIS — H9193 Unspecified hearing loss, bilateral: Secondary | ICD-10-CM | POA: Diagnosis not present

## 2022-12-08 DIAGNOSIS — Z974 Presence of external hearing-aid: Secondary | ICD-10-CM | POA: Diagnosis not present

## 2022-12-08 DIAGNOSIS — H6991 Unspecified Eustachian tube disorder, right ear: Secondary | ICD-10-CM | POA: Diagnosis not present

## 2022-12-08 NOTE — Telephone Encounter (Signed)
Call but no answer or VM.

## 2022-12-08 NOTE — Telephone Encounter (Signed)
Pt c/o of Chest Pain: STAT if CP now or developed within 24 hours  1. Are you having CP right now? No   2. Are you experiencing any other symptoms (ex. SOB, nausea, vomiting, sweating)? Headache, lightheaded   3. How long have you been experiencing CP? Since last week she saw Dr. Geraldo Pitter   4. Is your CP continuous or coming and going? Coming and going   5. Have you taken Nitroglycerin? No   Pt states she saw Dr. Geraldo Pitter last week and he told her her chest pain was not heart related but will order a CT if it continues. She states "I'm not having chest pain but around that area hurts all through my back an rib cage." Please advise.  ?

## 2022-12-09 NOTE — Telephone Encounter (Signed)
Attempted to call the patient. Patient did not answer and her voice mailbox was full so unable to leave a message.

## 2022-12-10 NOTE — Telephone Encounter (Signed)
Called patient and she reported that she was having chest pain that comes and goes and hurts all through her back and rib cage. When she has the chest pain it causes her to have headaches and to feel light headed. Patient states that she saw Dr. Geraldo Pitter on 3/28 and she was told that the chest pain was not cardiac related, but that he would order a CT if it continues. She also states that it feels like acid reflux and she has been taking Tums but the Tums doesn't seem to be helping. Her latest blood pressure was 145/68 and her heart rate was in the 60's but she couldn't remember the actual number.

## 2022-12-10 NOTE — Telephone Encounter (Signed)
Attempted to call the patient. Patient did not answer and her voice mailbox was full so unable to leave a message.

## 2022-12-11 NOTE — Telephone Encounter (Signed)
Left VM to call back 

## 2022-12-11 NOTE — Addendum Note (Signed)
Addended by: Truddie Hidden on: 12/11/2022 09:09 AM   Modules accepted: Orders

## 2022-12-12 NOTE — Telephone Encounter (Signed)
Left vm to call back

## 2022-12-16 DIAGNOSIS — Z1231 Encounter for screening mammogram for malignant neoplasm of breast: Secondary | ICD-10-CM | POA: Diagnosis not present

## 2022-12-16 NOTE — Telephone Encounter (Signed)
Spoke with pt and instructions given for CT scan as well as letter with instructions. Pt verbalized understanding and had no additional questions.

## 2022-12-22 DIAGNOSIS — H9193 Unspecified hearing loss, bilateral: Secondary | ICD-10-CM | POA: Diagnosis not present

## 2022-12-29 DIAGNOSIS — R072 Precordial pain: Secondary | ICD-10-CM | POA: Diagnosis not present

## 2022-12-30 DIAGNOSIS — R222 Localized swelling, mass and lump, trunk: Secondary | ICD-10-CM | POA: Diagnosis not present

## 2022-12-30 DIAGNOSIS — M8580 Other specified disorders of bone density and structure, unspecified site: Secondary | ICD-10-CM | POA: Diagnosis not present

## 2022-12-30 DIAGNOSIS — Z1339 Encounter for screening examination for other mental health and behavioral disorders: Secondary | ICD-10-CM | POA: Diagnosis not present

## 2022-12-30 DIAGNOSIS — Z9181 History of falling: Secondary | ICD-10-CM | POA: Diagnosis not present

## 2022-12-30 DIAGNOSIS — H6990 Unspecified Eustachian tube disorder, unspecified ear: Secondary | ICD-10-CM | POA: Diagnosis not present

## 2022-12-30 DIAGNOSIS — Z0000 Encounter for general adult medical examination without abnormal findings: Secondary | ICD-10-CM | POA: Diagnosis not present

## 2022-12-30 DIAGNOSIS — M19012 Primary osteoarthritis, left shoulder: Secondary | ICD-10-CM | POA: Diagnosis not present

## 2022-12-30 DIAGNOSIS — Z1331 Encounter for screening for depression: Secondary | ICD-10-CM | POA: Diagnosis not present

## 2022-12-30 LAB — BASIC METABOLIC PANEL
BUN/Creatinine Ratio: 13 (ref 12–28)
BUN: 9 mg/dL (ref 8–27)
CO2: 23 mmol/L (ref 20–29)
Calcium: 9.3 mg/dL (ref 8.7–10.3)
Chloride: 100 mmol/L (ref 96–106)
Creatinine, Ser: 0.67 mg/dL (ref 0.57–1.00)
Glucose: 126 mg/dL — ABNORMAL HIGH (ref 70–99)
Potassium: 4 mmol/L (ref 3.5–5.2)
Sodium: 141 mmol/L (ref 134–144)
eGFR: 88 mL/min/{1.73_m2} (ref >59)

## 2022-12-31 ENCOUNTER — Telehealth (HOSPITAL_COMMUNITY): Payer: Self-pay | Admitting: Emergency Medicine

## 2022-12-31 NOTE — Telephone Encounter (Signed)
Reaching out to patient to offer assistance regarding upcoming cardiac imaging study; pt verbalizes understanding of appt date/time, parking situation and where to check in, pre-test NPO status and medications ordered, and verified current allergies; name and call back number provided for further questions should they arise Sheila Meany RN Navigator Cardiac Imaging [facility] [phone] office [phone] cell 

## 2022-12-31 NOTE — Telephone Encounter (Signed)
Unable to leave vm Larrisha Babineau RN Navigator Cardiac Imaging [facility] [phone] Office  [phone] Cell  

## 2023-01-01 ENCOUNTER — Ambulatory Visit (HOSPITAL_COMMUNITY)
Admission: RE | Admit: 2023-01-01 | Discharge: 2023-01-01 | Disposition: A | Discharge status: Home / Self Care | Payer: PPO | Source: Ambulatory Visit | Attending: Cardiology | Admitting: Cardiology

## 2023-01-01 DIAGNOSIS — R072 Precordial pain: Secondary | ICD-10-CM | POA: Diagnosis not present

## 2023-01-01 MED ORDER — NITROGLYCERIN 0.4 MG SL SUBL
SUBLINGUAL_TABLET | SUBLINGUAL | Status: AC
  Filled 2023-01-01: qty 2

## 2023-01-01 MED ORDER — IOHEXOL 350 MG/ML SOLN
100.0000 mL | Freq: Once | INTRAVENOUS | Status: AC | PRN
  Administered 2023-01-01: 100 mL via INTRAVENOUS

## 2023-01-01 MED ORDER — NITROGLYCERIN 0.4 MG SL SUBL
0.8000 mg | SUBLINGUAL_TABLET | Freq: Once | SUBLINGUAL | Status: AC
  Administered 2023-01-01: 0.8 mg via SUBLINGUAL

## 2023-01-06 DIAGNOSIS — R2232 Localized swelling, mass and lump, left upper limb: Secondary | ICD-10-CM | POA: Diagnosis not present

## 2023-01-09 ENCOUNTER — Telehealth: Payer: Self-pay | Admitting: Cardiology

## 2023-01-09 DIAGNOSIS — I2510 Atherosclerotic heart disease of native coronary artery without angina pectoris: Secondary | ICD-10-CM

## 2023-01-09 NOTE — Telephone Encounter (Signed)
-----   Message from Garwin Brothers, MD sent at 01/07/2023  1:10 PM EDT ----- Nonobstructive disease.  Please bring patient in for Chem-7 liver lipid check.  Copy primary care Garwin Brothers, MD 01/07/2023 1:10 PM

## 2023-01-09 NOTE — Telephone Encounter (Signed)
Results reviewed with pt as per Dr. Revankar's note.  Pt verbalized understanding and had no additional questions. Routed to PCP.  

## 2023-01-09 NOTE — Telephone Encounter (Signed)
Patient is returning RN's call for her CT results. Please advise.  

## 2023-01-11 ENCOUNTER — Other Ambulatory Visit: Payer: Self-pay | Admitting: Cardiology

## 2023-01-12 DIAGNOSIS — I2510 Atherosclerotic heart disease of native coronary artery without angina pectoris: Secondary | ICD-10-CM | POA: Diagnosis not present

## 2023-01-12 LAB — COMPREHENSIVE METABOLIC PANEL
ALT: 12 IU/L (ref 0–32)
AST: 14 IU/L (ref 0–40)
Albumin/Globulin Ratio: 1.8 (ref 1.2–2.2)
Albumin: 4.1 g/dL (ref 3.7–4.7)
Alkaline Phosphatase: 69 IU/L (ref 44–121)
BUN/Creatinine Ratio: 20 (ref 12–28)
BUN: 13 mg/dL (ref 8–27)
Bilirubin Total: 0.4 mg/dL (ref 0.0–1.2)
CO2: 27 mmol/L (ref 20–29)
Calcium: 9.9 mg/dL (ref 8.7–10.3)
Chloride: 99 mmol/L (ref 96–106)
Creatinine, Ser: 0.65 mg/dL (ref 0.57–1.00)
Globulin, Total: 2.3 g/dL (ref 1.5–4.5)
Glucose: 98 mg/dL (ref 70–99)
Potassium: 3.9 mmol/L (ref 3.5–5.2)
Sodium: 141 mmol/L (ref 134–144)
Total Protein: 6.4 g/dL (ref 6.0–8.5)
eGFR: 88 mL/min/{1.73_m2} (ref >59)

## 2023-01-12 LAB — LIPID PANEL
Chol/HDL Ratio: 3.2 ratio (ref 0.0–4.4)
Cholesterol, Total: 187 mg/dL (ref 100–199)
HDL: 58 mg/dL (ref >39)
LDL Chol Calc (NIH): 115 mg/dL — ABNORMAL HIGH (ref 0–99)
Triglycerides: 76 mg/dL (ref 0–149)
VLDL Cholesterol Cal: 14 mg/dL (ref 5–40)

## 2023-01-13 ENCOUNTER — Telehealth: Payer: Self-pay

## 2023-01-13 ENCOUNTER — Other Ambulatory Visit: Payer: Self-pay

## 2023-01-13 DIAGNOSIS — E782 Mixed hyperlipidemia: Secondary | ICD-10-CM

## 2023-01-13 MED ORDER — ROSUVASTATIN CALCIUM 20 MG PO TABS: ORAL_TABLET | ORAL | 3 refills | Status: DC

## 2023-01-13 MED ORDER — ROSUVASTATIN CALCIUM 20 MG PO TABS: 20.0000 mg | ORAL_TABLET | Freq: Every day | ORAL | 3 refills | Status: DC

## 2023-01-13 NOTE — Telephone Encounter (Signed)
-----   Message from Rajan R Revankar, MD sent at 01/13/2023  8:09 AM EDT ----- Double statin and liver lipid check in 6 weeks.  Copy primary care Rajan R Revankar, MD 01/13/2023 8:09 AM  

## 2023-01-13 NOTE — Telephone Encounter (Signed)
-----   Message from Garwin Brothers, MD sent at 01/13/2023  8:09 AM EDT ----- Double statin and liver lipid check in 6 weeks.  Copy primary care Garwin Brothers, MD 01/13/2023 8:09 AM

## 2023-01-13 NOTE — Telephone Encounter (Signed)
Patient informed of results. Rosuvastatin medication ordered via Epic and sent to the patient's pharmacy. Lab work ordered via [employer].

## 2023-01-13 NOTE — Telephone Encounter (Signed)
Left VM to call back 

## 2023-01-13 NOTE — Telephone Encounter (Signed)
Patient is returning call. Requesting return call.  

## 2023-01-14 ENCOUNTER — Telehealth: Payer: Self-pay | Admitting: Cardiology

## 2023-01-14 DIAGNOSIS — H402232 Chronic angle-closure glaucoma, bilateral, moderate stage: Secondary | ICD-10-CM | POA: Diagnosis not present

## 2023-01-14 DIAGNOSIS — H02823 Cysts of right eye, unspecified eyelid: Secondary | ICD-10-CM | POA: Diagnosis not present

## 2023-01-14 NOTE — Telephone Encounter (Signed)
Called [facility] and clarified patient is to take Rosuvastatin 20 mg- Take 40 mg by mouth 3 times a week.  She thanked me for the call and had no additional questions.

## 2023-01-14 NOTE — Addendum Note (Signed)
Addended by: Leigha Olberding, Elmarie Shiley L on: 01/14/2023 09:08 AM   Modules accepted: Orders

## 2023-01-14 NOTE — Telephone Encounter (Signed)
Pt c/o medication issue:  1. Name of Medication:   rosuvastatin (CRESTOR) 20 MG tablet    rosuvastatin (CRESTOR) 20 MG tablet    2. How are you currently taking this medication (dosage and times per day)? N/A  3. Are you having a reaction (difficulty breathing--STAT)? N/A  4. What is your medication issue? Pharmacy is calling needing clarification on how the patient is to take these meds.   Please advise.

## 2023-01-19 DIAGNOSIS — H579 Unspecified disorder of eye and adnexa: Secondary | ICD-10-CM | POA: Diagnosis not present

## 2023-01-19 DIAGNOSIS — L249 Irritant contact dermatitis, unspecified cause: Secondary | ICD-10-CM | POA: Diagnosis not present

## 2023-01-27 ENCOUNTER — Ambulatory Visit: Admission: RE | Admit: 2023-01-27 | Payer: Self-pay | Source: Ambulatory Visit

## 2023-01-27 DIAGNOSIS — I10 Essential (primary) hypertension: Secondary | ICD-10-CM | POA: Diagnosis not present

## 2023-01-27 DIAGNOSIS — W01198A Fall on same level from slipping, tripping and stumbling with subsequent striking against other object, initial encounter: Secondary | ICD-10-CM | POA: Diagnosis not present

## 2023-01-27 DIAGNOSIS — E7800 Pure hypercholesterolemia, unspecified: Secondary | ICD-10-CM | POA: Diagnosis not present

## 2023-01-27 DIAGNOSIS — S0990XA Unspecified injury of head, initial encounter: Secondary | ICD-10-CM | POA: Diagnosis not present

## 2023-01-27 DIAGNOSIS — S0993XA Unspecified injury of face, initial encounter: Secondary | ICD-10-CM | POA: Diagnosis not present

## 2023-01-27 DIAGNOSIS — K219 Gastro-esophageal reflux disease without esophagitis: Secondary | ICD-10-CM | POA: Diagnosis not present

## 2023-01-28 ENCOUNTER — Inpatient Hospital Stay: Payer: Medicare (Managed Care)

## 2023-01-28 ENCOUNTER — Observation Stay
Admission: EM | Admit: 2023-01-28 | Discharge: 2023-01-29 | Disposition: A | Discharge status: Home / Self Care | Payer: Medicare (Managed Care) | Attending: Student in an Organized Health Care Education/Training Program | Admitting: Student in an Organized Health Care Education/Training Program

## 2023-01-28 ENCOUNTER — Emergency Department: Payer: Medicare (Managed Care)

## 2023-01-28 DIAGNOSIS — I959 Hypotension, unspecified: Secondary | ICD-10-CM | POA: Insufficient documentation

## 2023-01-28 DIAGNOSIS — Z7982 Long term (current) use of aspirin: Secondary | ICD-10-CM | POA: Insufficient documentation

## 2023-01-28 DIAGNOSIS — Z7952 Long term (current) use of systemic steroids: Secondary | ICD-10-CM | POA: Insufficient documentation

## 2023-01-28 DIAGNOSIS — Z008 Encounter for other general examination: Secondary | ICD-10-CM

## 2023-01-28 DIAGNOSIS — Z79899 Other long term (current) drug therapy: Secondary | ICD-10-CM | POA: Insufficient documentation

## 2023-01-28 DIAGNOSIS — E663 Overweight: Secondary | ICD-10-CM | POA: Insufficient documentation

## 2023-01-28 DIAGNOSIS — Z791 Long term (current) use of non-steroidal anti-inflammatories (NSAID): Secondary | ICD-10-CM | POA: Insufficient documentation

## 2023-01-28 DIAGNOSIS — S065XAA Traumatic subdural hemorrhage with loss of consciousness status unknown, initial encounter: Principal | ICD-10-CM | POA: Insufficient documentation

## 2023-01-28 DIAGNOSIS — S5002XA Contusion of left elbow, initial encounter: Secondary | ICD-10-CM | POA: Insufficient documentation

## 2023-01-28 DIAGNOSIS — W109XXA Fall (on) (from) unspecified stairs and steps, initial encounter: Secondary | ICD-10-CM | POA: Insufficient documentation

## 2023-01-28 DIAGNOSIS — M47812 Spondylosis without myelopathy or radiculopathy, cervical region: Secondary | ICD-10-CM | POA: Insufficient documentation

## 2023-01-28 DIAGNOSIS — Z7951 Long term (current) use of inhaled steroids: Secondary | ICD-10-CM | POA: Insufficient documentation

## 2023-01-28 DIAGNOSIS — E785 Hyperlipidemia, unspecified: Secondary | ICD-10-CM | POA: Insufficient documentation

## 2023-01-28 DIAGNOSIS — Z6826 Body mass index (BMI) 26.0-26.9, adult: Secondary | ICD-10-CM | POA: Insufficient documentation

## 2023-01-28 DIAGNOSIS — I6200 Nontraumatic subdural hemorrhage, unspecified: Principal | ICD-10-CM

## 2023-01-28 DIAGNOSIS — R531 Weakness: Secondary | ICD-10-CM

## 2023-01-28 DIAGNOSIS — G8911 Acute pain due to trauma: Secondary | ICD-10-CM | POA: Insufficient documentation

## 2023-01-28 DIAGNOSIS — I10 Essential (primary) hypertension: Secondary | ICD-10-CM | POA: Insufficient documentation

## 2023-01-28 DIAGNOSIS — F419 Anxiety disorder, unspecified: Secondary | ICD-10-CM | POA: Insufficient documentation

## 2023-01-28 DIAGNOSIS — K219 Gastro-esophageal reflux disease without esophagitis: Secondary | ICD-10-CM | POA: Insufficient documentation

## 2023-01-28 DIAGNOSIS — Z96611 Presence of right artificial shoulder joint: Secondary | ICD-10-CM | POA: Insufficient documentation

## 2023-01-28 DIAGNOSIS — W01198A Fall on same level from slipping, tripping and stumbling with subsequent striking against other object, initial encounter: Secondary | ICD-10-CM | POA: Diagnosis not present

## 2023-01-28 DIAGNOSIS — S0993XA Unspecified injury of face, initial encounter: Secondary | ICD-10-CM | POA: Diagnosis not present

## 2023-01-28 DIAGNOSIS — S0990XA Unspecified injury of head, initial encounter: Secondary | ICD-10-CM | POA: Diagnosis not present

## 2023-01-28 DIAGNOSIS — I6201 Nontraumatic acute subdural hemorrhage: Secondary | ICD-10-CM | POA: Diagnosis not present

## 2023-01-28 DIAGNOSIS — Z743 Need for continuous supervision: Secondary | ICD-10-CM | POA: Diagnosis not present

## 2023-01-28 DIAGNOSIS — S299XXA Unspecified injury of thorax, initial encounter: Secondary | ICD-10-CM | POA: Diagnosis not present

## 2023-01-28 DIAGNOSIS — S199XXA Unspecified injury of neck, initial encounter: Secondary | ICD-10-CM | POA: Diagnosis not present

## 2023-01-28 HISTORY — DX: Nontraumatic subdural hemorrhage, unspecified: I62.00

## 2023-01-28 HISTORY — DX: Essential (primary) hypertension: I10

## 2023-01-28 LAB — ECG 12-LEAD
Atrial Rate: 73 {beats}/min
Atrial Rate: 79 {beats}/min
IHS MUSE NARRATIVE AND IMPRESSION: NORMAL
IHS MUSE NARRATIVE AND IMPRESSION: NORMAL
P Axis: 83 degrees
P Axis: 84 degrees
P-R Interval: 132 ms
P-R Interval: 138 ms
Q-T Interval: 392 ms
Q-T Interval: 398 ms
QRS Duration: 82 ms
QRS Duration: 86 ms
QTC Calculation (Bezet): 431 ms
QTC Calculation (Bezet): 456 ms
R Axis: 62 degrees
R Axis: 66 degrees
T Axis: 203 degrees
T Axis: 209 degrees
Ventricular Rate: 73 {beats}/min
Ventricular Rate: 79 {beats}/min

## 2023-01-28 LAB — CBC
Absolute NRBC: 0 10*3/uL (ref 0.00–0.00)
Hematocrit: 47.1 % — ABNORMAL HIGH (ref 34.7–43.7)
Hgb: 15 g/dL — ABNORMAL HIGH (ref 11.4–14.8)
MCH: 26.6 pg (ref 25.1–33.5)
MCHC: 31.8 g/dL (ref 31.5–35.8)
MCV: 83.7 fL (ref 78.0–96.0)
MPV: 10.7 fL (ref 8.9–12.5)
Nucleated RBC: 0 /100 WBC (ref 0.0–0.0)
Platelets: 206 10*3/uL (ref 142–346)
RBC: 5.63 10*6/uL — ABNORMAL HIGH (ref 3.90–5.10)
RDW: 16 % — ABNORMAL HIGH (ref 11–15)
WBC: 7.36 10*3/uL (ref 3.10–9.50)

## 2023-01-28 LAB — BASIC METABOLIC PANEL
Anion Gap: 8 (ref 5.0–15.0)
BUN: 8 mg/dL (ref 7.0–21.0)
CO2: 28 mEq/L (ref 17–29)
Calcium: 9.8 mg/dL (ref 7.9–10.2)
Chloride: 103 mEq/L (ref 99–111)
Creatinine: 0.7 mg/dL (ref 0.4–1.0)
Glucose: 97 mg/dL (ref 70–100)
Potassium: 3.9 mEq/L (ref 3.5–5.3)
Sodium: 139 mEq/L (ref 135–145)
eGFR: >60 mL/min/{1.73_m2} (ref >=60)

## 2023-01-28 LAB — PT/INR
PT INR: 1.1 (ref 0.9–1.1)
PT: 13.1 s — ABNORMAL HIGH (ref 10.1–12.9)

## 2023-01-28 LAB — HIGH SENSITIVITY TROPONIN-I: hs Troponin-I: 6.6 ng/L

## 2023-01-28 MED ORDER — FAMOTIDINE 10 MG/ML IV SOLN (WRAP)
20.0000 mg | Freq: Once | INTRAVENOUS | Status: AC
Start: 2023-01-28 — End: 2023-01-28
  Administered 2023-01-28: 20 mg via INTRAVENOUS
  Filled 2023-01-28: qty 2

## 2023-01-28 MED ORDER — NICARDIPINE HCL 2.5 MG/ML IV SOLN
INTRAVENOUS | Status: AC
Start: 2023-01-28 — End: 2023-01-28
  Filled 2023-01-28: qty 10

## 2023-01-28 MED ORDER — ACETAMINOPHEN 325 MG PO TABS
650.0000 mg | ORAL_TABLET | Freq: Four times a day (QID) | ORAL | Status: DC
Start: 2023-01-28 — End: 2023-01-29
  Administered 2023-01-28 – 2023-01-29 (×3): 650 mg via ORAL
  Filled 2023-01-28 (×3): qty 2

## 2023-01-28 MED ORDER — TIMOLOL MALEATE 0.5 % OP SOLN
1.0000 [drp] | Freq: Two times a day (BID) | OPHTHALMIC | Status: DC
Start: 2023-01-28 — End: 2023-01-29
  Administered 2023-01-28 – 2023-01-29 (×3): 1 [drp] via OPHTHALMIC
  Filled 2023-01-28: qty 5

## 2023-01-28 MED ORDER — DORZOLAMIDE HCL 2 % OP SOLN
1.0000 [drp] | Freq: Two times a day (BID) | OPHTHALMIC | Status: DC
Start: 2023-01-28 — End: 2023-01-29
  Administered 2023-01-28 – 2023-01-29 (×3): 1 [drp] via OPHTHALMIC
  Filled 2023-01-28: qty 200

## 2023-01-28 MED ORDER — FUROSEMIDE 40 MG PO TABS
40.0000 mg | ORAL_TABLET | Freq: Every day | ORAL | Status: DC
Start: 2023-01-28 — End: 2023-01-29
  Administered 2023-01-28 – 2023-01-29 (×2): 40 mg via ORAL
  Filled 2023-01-28 (×2): qty 1

## 2023-01-28 MED ORDER — POLYETHYLENE GLYCOL 3350 17 G PO PACK
17.0000 g | PACK | Freq: Every day | ORAL | Status: DC
Start: 2023-01-28 — End: 2023-01-29
  Administered 2023-01-28 – 2023-01-29 (×2): 17 g via ORAL
  Filled 2023-01-28 (×2): qty 1

## 2023-01-28 MED ORDER — SODIUM CHLORIDE 0.9 % IV MBP
5.0000 mg/h | INTRAVENOUS | Status: DC
Start: 2023-01-28 — End: 2023-01-28

## 2023-01-28 MED ORDER — METHOCARBAMOL 500 MG PO TABS
750.0000 mg | ORAL_TABLET | Freq: Four times a day (QID) | ORAL | Status: DC
Start: 2023-01-28 — End: 2023-01-29
  Administered 2023-01-28 – 2023-01-29 (×5): 750 mg via ORAL
  Filled 2023-01-28 (×5): qty 2

## 2023-01-28 MED ORDER — ONDANSETRON HCL 4 MG/2ML IJ SOLN
4.0000 mg | Freq: Once | INTRAMUSCULAR | Status: AC
Start: 2023-01-28 — End: 2023-01-28

## 2023-01-28 MED ORDER — SENNOSIDES-DOCUSATE SODIUM 8.6-50 MG PO TABS
1.0000 | ORAL_TABLET | Freq: Every evening | ORAL | Status: DC
Start: 2023-01-28 — End: 2023-01-29
  Administered 2023-01-28: 1 via ORAL
  Filled 2023-01-28: qty 1

## 2023-01-28 MED ORDER — LEVETIRACETAM 500 MG PO TABS
500.0000 mg | ORAL_TABLET | Freq: Two times a day (BID) | ORAL | Status: DC
Start: 2023-01-28 — End: 2023-01-29
  Administered 2023-01-28 – 2023-01-29 (×3): 500 mg via ORAL
  Filled 2023-01-28 (×3): qty 1

## 2023-01-28 MED ORDER — ALPRAZOLAM 0.25 MG PO TABS
0.2500 mg | ORAL_TABLET | Freq: Two times a day (BID) | ORAL | Status: DC | PRN
Start: 2023-01-28 — End: 2023-01-29

## 2023-01-28 MED ORDER — HYDRALAZINE HCL 20 MG/ML IJ SOLN
10.0000 mg | Freq: Once | INTRAMUSCULAR | Status: AC
Start: 2023-01-28 — End: 2023-01-28
  Administered 2023-01-28: 10 mg via INTRAVENOUS
  Filled 2023-01-28: qty 1

## 2023-01-28 MED ORDER — SODIUM CHLORIDE 0.9 % IV BOLUS
1000.0000 mL | Freq: Once | INTRAVENOUS | Status: AC
Start: 2023-01-28 — End: 2023-01-28
  Administered 2023-01-28: 1000 mL via INTRAVENOUS

## 2023-01-28 MED ORDER — CARVEDILOL 6.25 MG PO TABS
6.2500 mg | ORAL_TABLET | Freq: Two times a day (BID) | ORAL | Status: DC
Start: 2023-01-28 — End: 2023-01-29
  Administered 2023-01-28 – 2023-01-29 (×3): 6.25 mg via ORAL
  Filled 2023-01-28 (×3): qty 1

## 2023-01-28 MED ORDER — PANTOPRAZOLE SODIUM 40 MG PO TBEC
40.0000 mg | DELAYED_RELEASE_TABLET | Freq: Every morning | ORAL | Status: DC
Start: 2023-01-28 — End: 2023-01-29
  Administered 2023-01-28 – 2023-01-29 (×2): 40 mg via ORAL
  Filled 2023-01-28 (×2): qty 1

## 2023-01-28 MED ORDER — ONDANSETRON HCL 4 MG/2ML IJ SOLN
INTRAMUSCULAR | Status: AC
Start: 2023-01-28 — End: 2023-01-28
  Administered 2023-01-28: 4 mg via INTRAVENOUS
  Filled 2023-01-28: qty 2

## 2023-01-28 MED ORDER — ROSUVASTATIN CALCIUM 10 MG PO TABS
40.0000 mg | ORAL_TABLET | Freq: Every evening | ORAL | Status: DC
Start: 2023-01-28 — End: 2023-01-29
  Administered 2023-01-28: 40 mg via ORAL
  Filled 2023-01-28: qty 4

## 2023-01-28 MED ORDER — OXYCODONE HCL 5 MG PO TABS
5.0000 mg | ORAL_TABLET | Freq: Four times a day (QID) | ORAL | Status: DC | PRN
Start: 2023-01-28 — End: 2023-01-29

## 2023-01-28 MED ORDER — AMLODIPINE BESYLATE 5 MG PO TABS
5.0000 mg | ORAL_TABLET | Freq: Once | ORAL | Status: AC
Start: 2023-01-28 — End: 2023-01-28
  Administered 2023-01-28: 5 mg via ORAL
  Filled 2023-01-28: qty 1

## 2023-01-28 MED ORDER — ISOSORBIDE MONONITRATE ER 30 MG PO TB24
30.0000 mg | ORAL_TABLET | Freq: Every day | ORAL | Status: DC
Start: 2023-01-28 — End: 2023-01-29
  Administered 2023-01-28 – 2023-01-29 (×2): 30 mg via ORAL
  Filled 2023-01-28 (×3): qty 1

## 2023-01-28 MED ORDER — LATANOPROST 0.005 % OP SOLN
1.0000 [drp] | Freq: Every evening | OPHTHALMIC | Status: DC
Start: 2023-01-28 — End: 2023-01-29
  Administered 2023-01-28: 1 [drp] via OPHTHALMIC
  Filled 2023-01-28: qty 1

## 2023-01-28 NOTE — ED Provider Notes (Signed)
[facility] EMERGENCY DEPARTMENT H&P           CLINICAL INFORMATION        HPI:      Chief Complaint: Fall  .    April Foley is a 82 y.o. female who presents with possible subdural hematoma after fall.  Patient seen at outside hospital after tripping and falling, hitting her head on a piece of furniture.  Noted large right hematoma.  CT scan with possible layering hyperdensity concerning for subdural hematoma.  Patient neurologically intact.  When asked about other symptoms, notes that she is having GERD like symptoms in epigastrium and lower chest region.  Notes that she just had a coronary CT, has been under investigation for CAD.  Denies any other pain.    {~~Source of history ~~:29379::"History obtained from: Patient"}    Triage Note: Trauma transfer for fall from standing. hematoma/ swelling to L eye. Pt reports being unable to see out of L eye. SDH found. Nicardipine started at [place] d/t BP/ stopped en route. Pt is A&O4, breathing even and unlabored, skin warm and dry (01/28/23 0137)      Physical Exam:      Vitals:    01/28/23 0137   BP: 198/86   Pulse: 72   Resp: 16   Temp: (!) 96.8 F (36 C)   TempSrc: Temporal   SpO2: 98%   Weight: 68.5 kg   Height: 5\' 3"  (1.6 m)       Nursing notes and vitals reviewed.   ***  Constitutional: alert, no acute distress  Eyes: EOM intact, conjugate gaze  HEENT: Neck supple with normal ROM  Cardiac: Warm and well perfused  Respiratory: Normal rate, No increased work of breathing   Abdomen/GI:  nondistended  Neurologic: alert, oriented, conversant, moves all extremities, stable gait  MSK: No deformities or crepitus, full ROM of joints without pain or limitation  Skin: warm and dry, no obvious rashes  Psychiatric: pleasant and cooperative            PAST HISTORY        Primary Care Provider: Pcp, None, MD        PMH/PSH:    .     No past medical history on file.    She has no past surgical history on file.      Social/Family History:       She has no history on file for tobacco use, alcohol use, and drug use.    No family history on file.      Listed Medications on Arrival:    .     Home Medications    None on File        Allergies: She has No Known Allergies.            VISIT INFORMATION        Medications Given in the ED:    .     ED Medication Orders (From admission, onward)      Start Ordered     Status Ordering Provider    01/28/23 0154 01/28/23 0153  famotidine (PEPCID) injection 20 mg  Once        Route: Intravenous  Ordered Dose: 20 mg       Ordered Dianna Ewald G    01/28/23 0140 01/28/23 0140  niCARdipine (CARDENE) 2.5 MG/ML injection        Note to Pharmacy: Blanch Media.: cabinet override    Ordered  Procedures:      Procedures      Interpretations:      O2 sat-           saturation: 98 %; Oxygen use: {.:29312::"room air"}; Interpretation: {.:29106::"Normal"}      EKG reviewed and interpreted by me:  Rhythm: Sinus  Rate: ***  Intervals: Normal  Axis: Normal  Ectopy: None  ST changes: No acute ischemic changes    Monitor data reviewed and interpreted by me - ***                            RESULTS        Lab Results:      Results       ** No results found for the last 24 hours. **                Radiology Results:      XR Chest  AP Portable    (Results Pending)              Visit date: 01/28/2023      CLINICAL SUMMARY           Diagnosis:    .     Final diagnoses:   None         MDM/ED Course:            Differential Diagnosis Considerations:      Re-evaluation    ***    Discharge / Follow up Planning    ***                        Disposition Decision:      Discharge    Standard discharge instructions including symptomatic management, expected course of disease as outpatient, return instructions, follow-up plan discussed with patient.  Patient confirmed understanding of instructions ***    Admit request discussed with Dr. Marland Kitchen  . Pt accepted for admission to ***. Patient admitted in stable condition.               Scribe Attestation:      {~~Scribe or no?~~:29594}       Parts of this note were generated by the Epic EMR system/ Dragon speech recognition and may contain inherent errors or omissions not intended by the user. Grammatical errors, random word insertions, deletions, pronoun errors and incomplete sentences are occasional consequences of this technology due to software limitations. Not all errors are caught or corrected. If there are questions or concerns about the content of this note or information contained within the body of this dictation they should be addressed directly with the author for clarification.

## 2023-01-28 NOTE — Plan of Care (Signed)
Pt admitted to the unit from ED this morning. Family at bed side. Pt is independent, but unsteady sometime  when she ambulated. Pain management with Scheduled Tylenol. Q4 neuro check. Bilateral hearing aid. Dual skin check for admission done.     Problem: Pain interferes with ability to perform ADL  Goal: Pain at adequate level as identified by patient  Outcome: Progressing  Flowsheets (Taken 01/28/2023 1842)  Pain at adequate level as identified by patient:   Identify patient comfort function goal   Assess pain on admission, during daily assessment and/or before any "as needed" intervention(s)   Reassess pain within 30-60 minutes of any procedure/intervention, per Pain Assessment, Intervention, Reassessment (AIR) Cycle   Evaluate patient's satisfaction with pain management progress   Consult/collaborate with Pain Service   Consult/collaborate with Physical Therapy, Occupational Therapy, and/or Speech Therapy   Include patient/patient care companion in decisions related to pain management as needed   Evaluate if patient comfort function goal is met     Problem: Side Effects from Pain Analgesia  Goal: Patient will experience minimal side effects of analgesic therapy  Outcome: Progressing  Flowsheets (Taken 01/28/2023 1842)  Patient will experience minimal side effects of analgesic therapy: Monitor/assess patient's respiratory status (RR depth, effort, breath sounds)     Problem: Moderate/High Fall Risk Score >5  Goal: Patient will remain free of falls  Outcome: Progressing  Flowsheets (Taken 01/28/2023 0850)  High (Greater than 13):   HIGH-Initiate use of floor mats as appropriate   HIGH-Apply yellow "Fall Risk" arm band   HIGH-Utilize chair pad alarm for patient while in the chair   HIGH-Bed alarm on at all times while patient in bed   HIGH-Visual cue at entrance to patient's room   HIGH-Pharmacy to initiate evaluation and intervention per protocol     Problem: Compromised Moisture  Goal: Moisture level  Interventions  Outcome: Progressing  Flowsheets (Taken 01/28/2023 0900)  Moisture level Interventions: Moisture wicking products, Moisture barrier cream     Problem: Compromised Activity/Mobility  Goal: Activity/Mobility Interventions  Outcome: Progressing  Flowsheets (Taken 01/28/2023 0900)  Activity/Mobility Interventions: Pad bony prominences, TAP Seated positioning system when OOB, Promote PMP, Reposition q 2 hrs / turn clock, Offload heels

## 2023-01-28 NOTE — ED Notes (Signed)
Bed: S 23  Expected date:   Expected time:   Means of arrival:   Comments:  Trauma tx here

## 2023-01-28 NOTE — Plan of Care (Signed)
Problem: Neurological Deficit  Goal: Neurological status is stable or improving  Outcome: Progressing  Flowsheets (Taken 01/28/2023 2346)  Neurological status is stable or improving:   Observe for seizure activity and initiate seizure precautions if indicated   Monitor/assess/document neurological assessment (Stroke: every 4 hours)   Monitor/assess NIH Stroke Scale   Perform CAM Assessment   Re-assess NIH Stroke Scale for any change in status     Problem: Pain interferes with ability to perform ADL  Goal: Pain at adequate level as identified by patient  Outcome: Progressing  Flowsheets (Taken 01/28/2023 2346)  Pain at adequate level as identified by patient:   Offer non-pharmacological pain management interventions   Reassess pain within 30-60 minutes of any procedure/intervention, per Pain Assessment, Intervention, Reassessment (AIR) Cycle   Assess pain on admission, during daily assessment and/or before any "as needed" intervention(s)   Assess for risk of opioid induced respiratory depression, including snoring/sleep apnea. Alert healthcare team of risk factors identified.   Identify patient comfort function goal

## 2023-01-28 NOTE — H&P (Signed)
TRAUMA HISTORY AND PHYSICAL     Date Time: 01/28/23 5:04 AM  Patient Name: April Foley  Attending Physician: Irene Pap, DO  Primary Care Physician: Marisa Sprinkles, MD    Date of Admission:   01/28/2023  1:37 AM    Trauma Level:   Consult    Assessment/Plan:   The patient has the following active problems:  Active Hospital Problems    Diagnosis    Subdural hemorrhage        Plan by systems:  Neuro: Acute pain 2/2 trauma; CTH w/ small tentorial SDH  - Obtain CT C spine  - Multimodal pain control  - NSGY consulted; appreciate rec'd  - q4hr NC, SBO goals < 150 per NSGY  - Seizure ppx: not indicated  HEENT: CT max face negative  Pulm: No Hx  - Sat'ing well on RA  - Pulm toilet  CV: Hx of HTN, HLD  - HDS off pressors. Large drop in BP with hydral push x1  - Required nicardipine gtt at OSH for SBP goal < 150  - Continue home meds  - Continue to monitor  Endo: No Hx  - BG PRN  GI: Hx of GERD  - NPO, mIVFs  - PRN antiemetics  - Bowel reg w/ doc-senna, miralax  - GI ppx: pepcid  Heme/ID: No Hx  - Monitor for s/s of acute hemorrhage  - DVT ppx: SCDs, chemo ppx held in setting of SDH  - Afebrile  - Monitor for s/s of acute infection  - Abx not indicated  Renal: No Hx  - Monitor UOP  - Replace lytes PRN  - Foley: not indicated  Neuromuscular: Hx right shoulder replacement  - WBAT in all extremities  - PT/OT: yes  Psych: No Hx  - Supportive  Wounds: None    Patient will be admitted to: [facility]  Massive transfusion protocol:  No      Additional Diagnoses:     Patient has BMI=Body mass index is 26.75 kg/m.  Diagnosis: Overweight based on BMI criteria        Consulting Services:   Neurosurgery : Resident - notified at 3:41 AM    Patient Complaint:   April Foley is a 82 y.o. female who presents to the hospital after fall down one step.    Scene Report:     Scene GCS: Eye opening 4 - spontaneous, Verbal Response 5 - alert/oriented, Motor Response 6 - obeys commands. Total GCS: 15  Transport: BLS, Time of Injury 3  hours ago  Transferred from: [facility]  LOC: No    Intubated: No  Hemodynamically: Stable  C-spine immobilized pre-hospital: No          The medications, past medical/surgical history, family history, allergies & full review of systems were:  Reviewed    Allergies:   No Known Allergies    Medication:   (Not in a hospital admission)      Past Medical History:     Past Medical History:   Diagnosis Date    Hypertension        Past Surgical History:   History reviewed. No pertinent surgical history.    Family History:   History reviewed. No pertinent family history.    Social History:     Social History     Socioeconomic History    Marital status: Single     Spouse name: Not on file    Number of children: Not on file    Years of  education: Not on file    Highest education level: Not on file   Occupational History    Not on file   Tobacco Use    Smoking status: Unknown    Smokeless tobacco: Not on file   Substance and Sexual Activity    Alcohol use: Not on file    Drug use: Not Currently    Sexual activity: Not on file   Other Topics Concern    Not on file   Social History Narrative    Not on file     Social Determinants of Health     Financial Resource Strain: Not on file   Food Insecurity: No Food Insecurity (01/28/2023)    Hunger Vital Sign     Worried About Running Out of Food in the Last Year: Never true     Ran Out of Food in the Last Year: Never true   Transportation Needs: Not on file   Physical Activity: Not on file   Stress: Not on file   Social Connections: Not on file   Intimate Partner Violence: Not At Risk (01/28/2023)    Humiliation, Afraid, Rape, and Kick questionnaire     Fear of Current or Ex-Partner: No     Emotionally Abused: No     Physically Abused: No     Sexually Abused: No   Housing Stability: Not on file       Vaccination:   Tetanus up to date: Unknown    Review of Systems:   Review of Systems   Constitutional:  Negative for chills and fever.   HENT:  Negative for congestion, ear discharge,  ear pain, hearing loss, nosebleeds, sinus pain and sore throat.    Eyes:  Negative for blurred vision (Initially burry vision but now improved.), double vision and pain.   Respiratory:  Negative for cough, sputum production, shortness of breath and wheezing.    Cardiovascular:  Negative for chest pain, palpitations and leg swelling.   Gastrointestinal:  Negative for abdominal pain, constipation, diarrhea, melena, nausea and vomiting.   Genitourinary:  Negative for dysuria, flank pain and hematuria.   Musculoskeletal:  Negative for back pain, joint pain, myalgias and neck pain.   Neurological:  Positive for dizziness and headaches. Negative for tingling, sensory change, speech change, focal weakness, seizures, loss of consciousness and weakness.   Psychiatric/Behavioral:  Negative for depression and memory loss.        Physical Exam:   Physical Exam  Constitutional:       Appearance: Normal appearance.   HENT:      Head:      Comments: Swelling and bruising along left forehead and lateral head.     Right Ear: External ear normal.      Left Ear: External ear normal.      Nose: Nose normal.      Mouth/Throat:      Mouth: Mucous membranes are moist.      Pharynx: Oropharynx is clear.   Eyes:      Extraocular Movements: Extraocular movements intact.      Pupils: Pupils are equal, round, and reactive to light.   Neck:      Comments: Mild muscular pain, no bony tenderness to palpation.  Cardiovascular:      Rate and Rhythm: Normal rate and regular rhythm.   Pulmonary:      Effort: Pulmonary effort is normal. No respiratory distress.      Breath sounds: Normal breath sounds. No stridor. No wheezing.  Abdominal:      General: Abdomen is flat. There is no distension.      Palpations: Abdomen is soft.      Tenderness: There is no abdominal tenderness.   Musculoskeletal:         General: Signs of injury (Left elbow bruise.) present. No swelling, tenderness or deformity.   Skin:     General: Skin is warm.      Coloration: Skin  is not jaundiced.      Findings: Bruising (Across left eyelid and forehead.) present.   Neurological:      General: No focal deficit present.      Mental Status: She is alert and oriented to person, place, and time. Mental status is at baseline.         Vitals:    01/28/23 0440   BP: 96/47   Pulse: 61   Resp: 19   Temp:    SpO2: 93%       Labs:         Invalid input(s): "AGAP"    Rads:   Radiological Procedure reviewed.      XR CHEST AP PORTABLE  CT CERVICAL SPINE WO CONTRAST      The following images were received from an outside facility and reviewed:CT Head    Attending Attestation:

## 2023-01-28 NOTE — Progress Notes (Signed)
4 eyes in 4 hours pressure injury assessment note:      Completed with: Ashtyn RN   Unit & Time admitted:   [id number] admit time 0730           Bony Prominences: Check appropriate box; if wound is present enter wound assessment in LDA     Occiput:                 [x] WNL  []  Wound present  Face:                     [x] WNL  []  Wound present  Ears:                      [x] WNL  []  Wound present  Spine:                    [x] WNL  []  Wound present  Shoulders:             [x] WNL  []  Wound present  Elbows:                  [x] WNL  []  Wound present  Sacrum/coccyx:     [x] WNL  []  Wound present  Ischial Tuberosity:  [x] WNL  []  Wound present  Trochanter/Hip:      [x] WNL  []  Wound present  Knees:                   [x] WNL  []  Wound present  Ankles:                   [x] WNL  []  Wound present  Heels:                    [x] WNL  []  Wound present  Other pressure areas:  []  Wound location       Device related: []  Device name:         LDA completed if wound present: yes/no  Consult WOCN if necessary    Other skin related issues, ie tears, rash, etc, document in Integumentary flowsheet    Blanchable heels and Bruising left eye

## 2023-01-28 NOTE — ED Notes (Signed)
Bed: T 3A  Expected date:   Expected time:   Means of arrival: Car  Comments:

## 2023-01-28 NOTE — ED to IP RN Note (Signed)
[facility] HOSPITAL EMERGENCY DEPT  ED NURSING NOTE FOR THE RECEIVING INPATIENT NURSE   ED NURSE Holly/Blossom   SPECTRALINK [id number]   ED CHARGE RN [phone]   ADMISSION INFORMATION   April Foley is a 82 y.o. female admitted with an ED diagnosis of:    1. Subdural hemorrhage         Isolation: None   Allergies: Patient has no known allergies.   Holding Orders confirmed? N/A   Belongings Documented? N/A   Home medications sent to pharmacy confirmed? N/A   NURSING CARE   Patient Comes From:   Mental Status: Home Independent  alert and oriented   ADL: Independent with all ADLs   Ambulation: mild difficulty   Pertinent Information  and Safety Concerns:     Broset Violence Risk Level: Low Trauma Tx for fall from standing. CT image shows possible SDH. Swelling to L orbital region. SBP goal <160. Was on cardene drip from outside facility. AOX4.      CT / NIH   CT Head ordered on this patient?  N/A   NIH/Dysphagia assessment done prior to admission? N/A   VITAL SIGNS (at the time of this note)      Vitals:    01/28/23 0440   BP: 96/47   Pulse: 61   Resp: 19   Temp:    SpO2: 93%     Pain Score: 10-severe pain (01/28/23 0233)

## 2023-01-28 NOTE — Plan of Care (Signed)
Pt is an 82 y/o female who is admitted after a fall down the stairs. She has a PMH of CAD, HTN, HLD  on Aspirin     Injuries: Small SDH     PE  Neuro: GCS 15. PERRLA   HEENT: Left periorbital edema and ecchymosis   Resp: LS CTA b/l   CV: Sinus rhythm   Abd; Soft NT/ND   GU: Voiding independently   MS: Moves all extremities spontaneouslu   Skin : Intact     Plan  - Q4 NC  - Tert in AM   - Rpt HCT tomorrow for DVT ppx   - PT/OT

## 2023-01-28 NOTE — Consults (Signed)
NEUROSURGERY CONSULTATION    Date Time: 01/28/23 3:41 AM  Patient Name: April Foley  Requesting Physician: Wandra Feinstein, MD  Consulting Physician: Dr. Deloria Lair  Covered By: Team A: [id number]    Time consult called: 0200   Time examined: 0230     Reason for Consultation:   Lesle Chris SDH    Assessment:   April Foley is a 82 y.o. female with PMH HTN on ASA, presenting with small tentorial SDH s/p fall. Neuro intact     Plan:   - Admit per ED  - Primary medical management per trauma  - q4h neurochecks  - HOB >30  - Stat CTH for neuro decline  - No anticoagulants/antiplatelets/NSAIDs   - INR goal <1.3, reverse PRN   - PLT goal >100k, transfuse PRN  - DVT PPx   - SCD's   - Chemo DVT PPx: per primary    No acute neurosurgical interventions. Please call with further questions, neuro decline, or new neuroimaging requiring our review. Please follow up in 4 wks with rHCT.     Plan discussed and formulated with Dr. Deloria Lair.    History:   April Foley is a 82 y.o.  female with PMH HTN who presents to the hospital on 01/28/2023 after a fall while walking down stairs. (-) LOC. Denies any weakness, numbness, tingling, seizures.      Denies , fevers, chills, CP, SOB, recent illness, sick contacts, personal or family hx    Blood thinners: ASA LD 5/21    Medical history obtained via chart review and patient interview at bedside.    Past Medical History:     Past Medical History:   Diagnosis Date    Hypertension      I reviewed the past medical history myself. The patient has no medical problems pertinent to this encounter.      Past Surgical History:   History reviewed. No pertinent surgical history.  I reviewed the past surgical history myself. The patient has no prior surgical history pertinent to this encounter.      Family History:   History reviewed. No pertinent family history.  I reviewed the past family history myself. Family history is non-contributory nor pertinent to this encounter.  .    Social  History:     Social History     Socioeconomic History    Marital status: Single     Spouse name: Not on file    Number of children: Not on file    Years of education: Not on file    Highest education level: Not on file   Occupational History    Not on file   Tobacco Use    Smoking status: Unknown    Smokeless tobacco: Not on file   Substance and Sexual Activity    Alcohol use: Not on file    Drug use: Not Currently    Sexual activity: Not on file   Other Topics Concern    Not on file   Social History Narrative    Not on file     Social Determinants of Health     Financial Resource Strain: Not on file   Food Insecurity: No Food Insecurity (01/28/2023)    Hunger Vital Sign     Worried About Running Out of Food in the Last Year: Never true     Ran Out of Food in the Last Year: Never true   Transportation Needs: Not on file   Physical Activity: Not on file  Stress: Not on file   Social Connections: Not on file   Intimate Partner Violence: Not At Risk (01/28/2023)    Humiliation, Afraid, Rape, and Kick questionnaire     Fear of Current or Ex-Partner: No     Emotionally Abused: No     Physically Abused: No     Sexually Abused: No   Housing Stability: Not on file     Reviewed and non-contributory to patient's current illness.       Allergies:   No Known Allergies  Reviewed in chart.    Medications:     Current Facility-Administered Medications   Medication Dose Route Frequency    niCARdipine         Reviewed in chart.    Review of Systems:   A comprehensive review of systems was reviewed and noted as per HPI. All other systems were reviewed and are negative.    Physical Exam:     Vitals:    01/28/23 0259   BP: 182/77   Pulse:    Resp:    Temp:    SpO2:        Intake and Output Summary (Last 24 hours) at Date Time  No intake or output data in the 24 hours ending 01/28/23 0341    General Appearance: well-appearing, NAD  HEENT: atraumatic, normocephalic, L eye swollen shut  CV: Hemodynamically stable, appears  well-perfused  Pulm: Normal effort, equal chest rise  GI: soft, non-distended  Ext: No edema, distal pulses present  Skin: No wounds    NEURO:  GCS: 15    Mental Status:     Opens eyes: Spont  Orientation:  A&Ox4.         Language:   Fluent, spontaneous speech. Comprehension intact, repetition and naming normal. Follows 1-step commands with midline cross. Attends to interviewer. Provides adequate medical history.          Cranial Nerves  II: OS: size/reactivity: 55mm/Reactive                OD: size/reactivity: 77mm/Reactive          III/IV/VI: EOMI. Smooth horizontal pursuits and saccades.    V: Sensation intact V1-V3 to LT  VII: Face symmetric at rest and with grimace/eye closure/eyebrow raise.  VIII: Hearing intact to conversation. No primary or gaze-evoked nystagmus.  IX/X: Palate elevation symmetric. Uvula midline.  XI: SCM/SS strength 5/5 bilaterally. Normal bulk.  XII: Tongue midline without atrophy or fasciculations.      Motor:  Normal bulk and tone throughout. No abnormal movements.  Pronator Drift: none    BUE/BLE 5/5    Sensory: Sensation intact to LT throughout. No spinal level.      Clonus: None.  Hoffmans: Absent bilaterally    Labs Reviewed:   No results found for: "WBC", "HGB", "HCT", "MCV", "PLT"  No results found for: "NA", "K", "CL", "CO2"  No results found for: "INR", "PT"    Rads:   Neuroimaging independently reviewed by me. Agree with radiology reports below.    Radiology Results (24 Hour)       Procedure Component Value Units Date/Time    XR Chest  AP Portable [[id number]] Collected: 01/28/23 0308    Order Status: Completed Updated: 01/28/23 0312    Narrative:      HISTORY: Chest discomfort after fall.        COMPARISON: None.    FINDINGS:   Heart is borderline enlarged. Pulmonary vasculature is within normal  limits. No focal airspace opacity  or pleural effusion. No evidence of  pneumothorax. Degenerative changes of the visualized spine. Postsurgical  changes of the left shoulder.  Age-indeterminate nondisplaced fractures of  the left posterior sixth, seventh and eighth ribs.      Impression:        1. No acute pulmonary process.  2. Age-indeterminate nondisplaced fractures of the left posterior sixth,  seventh and eighth ribs.    Judd Gaudier, MD  01/28/2023 3:10 AM            Signed by: Izora Gala, MD   Neurosurgery PGY-3  01/28/23  3:41 AM     Pt seen examined and agree with above    JFH  GCS: 15    Mental Status:     Opens eyes: Spont  Orientation:  A&Ox4.         Language:   Fluent, spontaneous speech. Comprehension intact, repetition and naming normal. Follows 1-step commands with midline cross. Attends to interviewer. Provides adequate medical history.          Cranial Nerves  II: OS: size/reactivity: 16mm/Reactive                OD: size/reactivity: 66mm/Reactive          III/IV/VI: EOMI. Smooth horizontal pursuits and saccades.    V: Sensation intact V1-V3 to LT  VII: Face symmetric at rest and with grimace/eye closure/eyebrow raise.  VIII: Hearing intact to conversation. No primary or gaze-evoked nystagmus.  IX/X: Palate elevation symmetric. Uvula midline.  XI: SCM/SS strength 5/5 bilaterally. Normal bulk.  XII: Tongue midline without atrophy or fasciculations.      Motor:  Normal bulk and tone throughout. No abnormal movements.  Pronator Drift: none    BUE/BLE 5/5    Sensory: Sensation intact to LT throughout. No spinal level.      Clonus: None.  Hoffmans: Absent bilaterally    A/ SDH with minimal mass effect  P/ Neuro checks q 1 hr 2 Rpt CTH for exam decline 3 NO plan for acute NS intervention 4 No AEDS  5 Hold antiplatelet agents      JFH

## 2023-01-28 NOTE — ED Triage Notes (Addendum)
Pt BIBA as trauma transfer for fall down stairs, pt states she missed last step and fell head first. +Headstrike, -LOC, takes 81mg  aspirin daily (no other thinners). Pt has hematoma to L eye/eyebrow, L eye is swollen shut. Possible SDH per radiology read. Nicardipine stopped during transport to this hospital. Pt currently A&O x 4, endorsing head "soreness" and pain to L eye. Pt denies N/V, vision changes, and numbness/tingling. Pt able to move all extremities. Hx HTN.

## 2023-01-28 NOTE — PT Eval Note (Signed)
Physical Therapy Evaluation  April Foley      Post Acute Care Therapy Recommendations:     Discharge Recommendations:  Home with supervision    DME needs IF patient is discharging home: Shower chair, Single point cane (cane issued)    Therapy discharge recommendations may change with patient status.  Please refer to most recent note for up-to-date recommendations.    Unit: [facility] TOWER 3  Bed: F336/F336.01  Assessment:   Significant Findings: none    April Foley is a 82 y.o. female admitted 01/28/2023 s/p fall with CT showing possible SDH and swelling to L orbital region. Patient presents with good overall strength, endurance and mobility. Ambulating out in hallway sup to sba initially. Guarded upper body and slower pace. Improved balance/safety using SPC. Patient sequencing well with practice. Patient reports feeling more comfortable using the cane. Often uses walking stick to mailbox and feels more secure. Navigates stairs with sup and step to pattern, single rail. Patient likely at/near recent functional baseline. Reviewed safety education and continued walking with RN staff. No further acute PT needs. [facility] PT.     Rehabilitation Potential: good    Treatment Activities: evaluation, gait training w/ cane, stairs  Educated the patient to role of physical therapy, plan of care, goals of therapy and HEP, safety with mobility and ADLs, home safety.    Plan:   D/C acute PT services    Treatment/Interventions: PT eval, gait training    Risks/benefits/POC discussed with patient       Precautions and Contraindications:   falls    Consult received for April Foley for PT Evaluation and Treatment.  Patient's medical condition is appropriate for Physical Therapy intervention at this time.    History of Present Illness:   April Foley is a 82 y.o. female admitted on 01/28/2023 "after a fall while walking down stairs. (-) LOC. Denies any weakness, numbness, tingling,  seizures"-from H&P    presenting with small tentorial SDH s/p fall. Neuro intact        Medical Diagnosis: Subdural hemorrhage [I62.00]    Past Medical/Surgical History:  Past Medical History:   Diagnosis Date    Hypertension      History reviewed. No pertinent surgical history.    Imaging/Tests/Labs:  CT Cervical Spine WO Contrast  Result Date: 01/28/2023   1.No evidence of acute fracture or traumatic malalignment in the cervical spine. 2.Degenerative changes. Vernie Murders, MD 01/28/2023 5:29 AM    XR Chest  AP Portable  Result Date: 01/28/2023  1. No acute pulmonary process. 2. Age-indeterminate nondisplaced fractures of the left posterior sixth, seventh and eighth ribs. Judd Gaudier, MD 01/28/2023 3:10 AM      Social History:   Prior Level of Function: Ind ADLs and mobility  Assistive Devices: None  Baseline Activity: Community level, driving, working  DME Currently at Home: None  Home Living Arrangements: Son  Type of Home: [facility]  Home Layout: Multi-level    Patient is agreeable to participation in the therapy session. Nursing clears patient for therapy.     Patient Goal: go home    Pain:   denies    Objective:   Patient received  in bed  with no in place. L eye swelling noted.  Pt wore mask during therapy session:No      Cognitive Status and Neuro Exam:  Alert and oriented x 4    Musculoskeletal Examination  RUE ROM: WFL  LUE ROM: [facility]  RLE ROM: WFL  LLE ROM: WFL    RUE Strength: WFL  LUE Strength: WFL  RLE Strength: WFL  LLE Strength: WFL    Functional Mobility  Rolling: independent  Supine to Sit: independent  Scooting: independent  Sit to Stand:sup  Stand to Sit: sup  Transfers: sup    Ambulation  PMP - Progressive Mobility Protocol   PMP Activity: Step 7 - Walks out of Room  Distance Walked (ft) (Step 6,7): 225 Feet 150/75  Level of assistance required: sup to sba  Pattern: slower pace, guarded upper body initially; improved with SPC; no LOB but tentative  Device Used: cane or none  Weightbearing Status: FWB  BLEs  Stair Management: sup w/ step to pattern  Number of Stairs: 7        Balance  Static Sitting: ind  Dynamic Sitting: ind  Static Standing: sup  Dynamic Standing: sba    Participation and Activity Tolerance  Participation Effort: good  Endurance: good    Patient left with call bell within reach, all needs met, SCDs off, fall mat in place, bed alarm on, chair alarm off and all questions answered. RN notified of session outcome and patient response.     PPE worn during session: procedural mask and gloves    Tech present: no  PPE worn by tech: N/A      April Foley, PT  [phone]    Time of Treatment  PT Received On: 01/28/23  Start Time: 1010  Stop Time: 1035  Time Calculation (min): 25 min

## 2023-01-28 NOTE — OT Eval Note (Signed)
Occupational Therapy Eval April Foley        Post Acute Care Therapy Recommendations:     Discharge Recommendations:  Home with supervision    DME needs IF patient is discharging home: Shower chair    Therapy discharge recommendations may change with patient status.  Please refer to most recent note for up-to-date recommendations.    Assessment:   Significant Findings: None    GWENEVERE Foley is a 82 y.o. female admitted 01/28/2023.  Patient presents with fall with CT showing possible SDH and swelling to L orbital region. Pt presents to OT at/near functional baseline - able to perform ADLs with supervision. No further acute OT needs. D/C acute OT services.      Therapy Diagnosis: ADL assessment    Rehabilitation Potential: good    Treatment Activities: Evaluation     Educated the patient to role of occupational therapy, plan of care, goals of therapy and safety with mobility and ADLs.    Plan:   D/C acute OT services     Treatment/Interventions: No skilled interventions needed at this time    Risks/benefits/POC discussed with patient     Unit: [facility] TOWER 3  Bed: F336/F336.01       Precautions and Contraindications:   Falls     Consult received for April Foley for OT Evaluation and Treatment.  Patient's medical condition is appropriate for Occupational Therapy intervention at this time.      History of Present Illness:    April Foley is a 82 y.o. female admitted on 01/28/2023 with fall at home with possible SDH on CT and L orbital region. Per chart    Admitting Diagnosis: Subdural hemorrhage [I62.00]    Past Medical/Surgical History:  Past Medical History:   Diagnosis Date    Hypertension        History reviewed. No pertinent surgical history.        Imaging/Tests/Labs:  CT Cervical Spine WO Contrast    Result Date: 01/28/2023   1.No evidence of acute fracture or traumatic malalignment in the cervical spine. 2.Degenerative changes. Vernie Murders, MD  01/28/2023 5:29 AM    XR Chest  AP Portable    Result Date: 01/28/2023  1. No acute pulmonary process. 2. Age-indeterminate nondisplaced fractures of the left posterior sixth, seventh and eighth ribs. Judd Gaudier, MD 01/28/2023 3:10 AM      Social History:   Foley Level of Function: Ind ADLs and mobility  Assistive Devices: None  Baseline Activity: Community level, driving, working  DME Currently at Home: None  Home Living Arrangements: Son  Type of Home: [facility]  Home Layout: Multi-level    Subjective: "I am visiting my other son for his child's graduation."    Patient is agreeable to participation in the therapy session. Nursing clears patient for therapy.     Patient Goal: To get better and go home    Pain:   Scale: not rated  Location: headache, eye pain  Intervention: RN aware    Objective:   Patient is in bed with peripheral IV in place.    Pt wore mask during therapy session:No      Cognitive Status and Neuro Exam:  Alert  Follows directions     Musculoskeletal Examination  Gross ROM: WFL  Gross Strength: WFL    Activities of Daily Living  Eating: Ind hand to mouth  Grooming: setup  Bathing: NT  UE Dressing: Ind  LE Dressing: supervision  Toileting: supervision simulated     Functional Mobility:  Supine to Sit: supervision  Sit to Stand: supervision  Transfers: supervision    PMP - Progressive Mobility Protocol   PMP Activity: Step 7 - Walks out of Room     Balance  Static Sitting: good  Dynamic Sitting: good  Static Standing: good  Dynamic Standing: fair +    Participation and Activity Tolerance  Participation Effort: good  Endurance: fair +    Patient left with PT for further assessment and all questions answered. RN notified of session outcome and patient response.     Goals: EVAL only            PPE worn during session: procedural mask and gloves    Tech present: No    Bard Herbert OTR/L  Pager # [phone]       Time of Treatment:   OT Received On: 01/28/23  Start Time: 1000  Stop Time: 1020  Time  Calculation (min): 20 min

## 2023-01-28 NOTE — ED Notes (Signed)
Hold Nicardipine at this time until further instruction from neurosurgery per MD Solberg. SBP 190s, MD Solberg aware.

## 2023-01-28 NOTE — Discharge Instr - AVS First Page (Addendum)
TRAUMA ACUTE CARE SURGERY DISCHARGE INSTRUCTIONS    Your primary management team during your stay was the Trauma Acute Care Surgery [facility]) team.  Our office/mailing address is:      Trauma Acute Care Services  [address]  [place], [place] [address]  Phone: [phone]   Fax: [phone]      Follow Up Information:  Not all patients need to follow up with our trauma surgeons.  If you are recommended to do so, please call [phone] within 48 hours of your discharge from the hospital, acute rehab facility, or skilled nursing facility.  Our trauma clinic is held on Tuesdays.    Do you need to make an appointment to be seen in the trauma clinic?:  You do not need to follow up in the trauma clinic. Please call with any questions or concerns.  - Please follow up outpatient with Neurosurgery and your primary care provider    Diet:  Regular  Activity: Activity as tolerated  Wound Care: None  Additional special instructions upon discharge:  Avoid NSAIDs such as Ibuprofen, Advil, Motrin, Naproxen, Aleve, and Aspirin until you follow up with neurosurgery. OTC Tylenol is okay.  -Please return to the ER if you or your family notice:      - Any change in neurologic status (increased sleepiness, confusion, angry outburst)      - Nausea or vomiting      - Worsening headache       - Seizure like behavior (convulsions)      Wounds/Surgical Incisions:  You will be given specific instructions on how to care for your wound.  In general, dressings may be removed 48 hours after your operation or discharge and you may shower.  Do not soak your incisions.  If the wound should become red, warm or starts draining fluid, or you if you begin to have a fever or increased pain at the site, please contact our office.      Forms requiring a provider's signature:  If you have insurance forms, Family and Medical Leave (FMLA) forms, or worker's compensation forms that need to be filled out by your provider, please mail  or fax the forms to our office at the mailing address listed above.    Pain Medication:  DO NOT drive or operate machinery while taking pain medication  Avoid drinking alcohol while taking pain medicine    The trauma service can manage your general postoperative or post-trauma pain. If your pain is from injuries or an operation that was handled or performed by another surgery team or consultant, such as Orthopedics, Neurosurgery, or Spine surgery, we kindly request that you call that surgeon's office to address your pain needs. If you have follow-up with the trauma service, please call the trauma office.  Please plan accordingly as refills can take up to 48-72 hours.    It is important to take your medications on time and never take more than the prescribed amount  Take with food to avoid an upset stomach  Medications take time to work, up to 20-30 minutes to take effect    If the pain lessens, try taking your pain medication less often  Eliminate a dose of pain medication at a time of day when you experience less pain    Initiate use of Tylenol as your pain decreases, until you no longer need to take any medication for pain     Constipation is a common side effect of pain medications  You should be taking a stool softener (ex. Colace) as long as you are on any narcotic pain medication.    Eat lots of fruits and vegetables and keep hydrated   Over the counter laxatives and enemas are available at your local pharmacy       For questions regarding these instructions, please contact the [facility] Nurse Navigator, Jadene Pierini, at [phone] or [email or web address], Monday-Friday 8am-4pm       Patients experience different levels of emotional and psychological stress after being injured. This is common and is not abnormal. If you need assistance with this, we can help. To access emotional support resources after your traumatic injury, please contact the [facility] Survivors Network Coordinator, Lenora Boys, at [phone] or [email or web address].  You may also find helpful information from the American Trauma Society and Trauma Survivors Network websites at www.amtrauma.org and [email or web address] - Neurosurgery   [address]  [place], [place] [address]  Phone: [phone] and Fax: [phone]    Discharge Instructions for Non-operative Intracranial Hemorrhage (ICH)    Neurosurgeon: Dr. Deloria Lair    FOLLOW-UP:  You will need a follow-up in 4 weeks with Vanessa Kick, PA    IMAGING:  You will need a repeat CT head in 4 weeks, 1-2 days prior to your appointment.  Call the neurosurgery office for the CT scan order.  Please Call [facility] [phone] or [facility] to schedule your imaging [phone].     ACTIVITY:  Increase activity slowly; do not rapidly increase activity just because you feel good in the morning  If you feel lightheaded or fatigued after increasing activity, rest and decrease the amount you are doing to build activity slowly  Do not drive until you are cleared by your physician and insurance company. You may not drive if you have had a seizure.  Do not lift items over 5 lbs   Avoid activity that causes you to hold your breath and strain; for example lifting, moving heavy objects, straining during bowel movements.    SMOKING:  Smoking delays the healing process; we request that you avoid smoking if possible    EATING AND DRINKING:  You may resume a normal diet following your procedure. Please avoid alcoholic beverages while taking pain medication.    MEDICATION:   You may resume your usual medications after surgery except blood thinners, anti-inflammatory agents or anti-platelet drugs. You must discuss with your surgeon before resuming Aspirin, Coumadin, Plavix, Aggrenox, Heparin, Eliquis, Pradaxa, Celebrex, Meloxicam, Ibuprofen, or Naproxen.  Anti seizure medication may be prescribed to you upon  discharge from the hospital. This is to decrease your risk of seizure. Make sure that you understand the medication instructions and take as prescribed. If you have any questions about your medications please call your surgeons office or your pharmacist.    PROBLEMS or CONCERNS:  Please call our office with any problems or concerns or if you notice any of the following signs/symptoms  New symptoms including numbness, tingling, weakness in your face or extremities  Severe headache or change in headache  Hallucinations  Go immediately to the EMERGENCY ROOM if you have any of the following symptoms:  Confusion, fainting, blacking out, extreme fatigue, memory loss, difficulty speaking  Double or blurred vision (new), loss of vision, either partial or total  Stiff neck and/or temperature greater than 101.5?F  Severe sensitivity to light (  photophobia) accompanied by severe headache or stiff neck  Seizure  Swollen painful calf with or without fever  Sudden shortness of breath and racing pulse    Our office is open Monday-Friday 0800-4:30 pm.  After 4:30 pm you will be directed to the after-hours service with a neurosurgery team member.

## 2023-01-28 NOTE — Progress Notes (Signed)
CM attempted to meet pt at bedside for assessment and d/c planning. Pt asleep. CM call pt's name twice, pt remained asleep.     Will return to bedside later for assessment.     Larita Fife, MSW  Social Worker Case Manager  Care Management Department  [facility]

## 2023-01-29 ENCOUNTER — Other Ambulatory Visit: Payer: Self-pay

## 2023-01-29 DIAGNOSIS — S065XAA Traumatic subdural hemorrhage with loss of consciousness status unknown, initial encounter: Secondary | ICD-10-CM

## 2023-01-29 DIAGNOSIS — R2681 Unsteadiness on feet: Secondary | ICD-10-CM

## 2023-01-29 DIAGNOSIS — R54 Age-related physical debility: Secondary | ICD-10-CM

## 2023-01-29 DIAGNOSIS — Z7189 Other specified counseling: Secondary | ICD-10-CM

## 2023-01-29 DIAGNOSIS — Z79899 Other long term (current) drug therapy: Secondary | ICD-10-CM

## 2023-01-29 DIAGNOSIS — Z1339 Encounter for screening examination for other mental health and behavioral disorders: Secondary | ICD-10-CM

## 2023-01-29 DIAGNOSIS — F419 Anxiety disorder, unspecified: Secondary | ICD-10-CM

## 2023-01-29 DIAGNOSIS — R52 Pain, unspecified: Secondary | ICD-10-CM

## 2023-01-29 MED ORDER — OXYCODONE HCL 5 MG PO TABS
5.0000 mg | ORAL_TABLET | Freq: Four times a day (QID) | ORAL | 0 refills | Status: AC | PRN
Start: 2023-01-29 — End: 2023-02-05

## 2023-01-29 MED ORDER — POLYETHYLENE GLYCOL 3350 17 G PO PACK
17.0000 g | PACK | Freq: Every day | ORAL | Status: AC
Start: 2023-01-30 — End: ?

## 2023-01-29 MED ORDER — NALOXONE HCL 4 MG/0.1ML NA LIQD
NASAL | 0 refills | Status: AC
Start: 2023-01-29 — End: ?

## 2023-01-29 MED ORDER — METHOCARBAMOL 500 MG PO TABS
500.0000 mg | ORAL_TABLET | Freq: Four times a day (QID) | ORAL | 0 refills | Status: AC
Start: 2023-01-29 — End: 2023-02-12

## 2023-01-29 MED ORDER — CALCIUM CARBONATE ANTACID 500 MG PO CHEW
500.0000 mg | CHEWABLE_TABLET | Freq: Four times a day (QID) | ORAL | Status: DC | PRN
Start: 2023-01-29 — End: 2023-01-29

## 2023-01-29 MED ORDER — LEVETIRACETAM 500 MG PO TABS
500.0000 mg | ORAL_TABLET | Freq: Two times a day (BID) | ORAL | 0 refills | Status: AC
Start: 2023-01-28 — End: 2023-02-03

## 2023-01-29 MED ORDER — SENNOSIDES-DOCUSATE SODIUM 8.6-50 MG PO TABS
1.0000 | ORAL_TABLET | Freq: Every evening | ORAL | Status: AC
Start: 2023-01-29 — End: ?

## 2023-01-29 NOTE — UM Notes (Signed)
PATIENT NAME: April Foley, April Foley   DOB: [birth year]     Admission Order: ADMIT TO INPATIENT (Order #[id number]) on 01/28/23   Admit Diagnosis: Subdural hemorrhage [I62.00]  Facility: [facility]    Reason for Admission: 82 y.o. female who presents to the hospital after fall down one step.     Vital Signs:Temp:  [97.7 F (36.5 C)-97.9 F (36.6 C)] 97.7 F (36.5 C)  Heart Rate:  [53-96] 68  Resp Rate:  [17-21] 21  BP: (87-129)/(47-70) 128/70    Medications;Scheduled Meds:  Current Facility-Administered Medications   Medication Dose Route Frequency    acetaminophen  650 mg Oral 4 times per day    carvedilol  6.25 mg Oral Q12H SCH    dorzolamide  1 drop Both Eyes Q12H SCH    furosemide  40 mg Oral Daily    isosorbide mononitrate  30 mg Oral Daily    latanoprost  1 drop Both Eyes QHS    levETIRAcetam  500 mg Oral Q12H SCH    methocarbamol  750 mg Oral QID    pantoprazole  40 mg Oral QAM AC    polyethylene glycol  17 g Oral Daily    rosuvastatin  40 mg Oral QHS    senna-docusate  1 tablet Oral QHS    timolol  1 drop Both Eyes Q12H [facility]     Abnormal Labs   Latest Reference Range & Units 01/28/23 12:50   Hemoglobin 11.4 - 14.8 g/dL 21.3 (H)   Hematocrit 08.6 - 43.7 % 47.1 (H)   (H): Data is abnormally high    Imaging;CT Cervical Spine WO Contrast    Result Date: 01/28/2023   1.No evidence of acute fracture or traumatic malalignment in the cervical spine. 2.Degenerative changes. Vernie Murders, MD 01/28/2023 5:29 AM    XR Chest  AP Portable    Result Date: 01/28/2023  1. No acute pulmonary process. 2. Age-indeterminate nondisplaced fractures of the left posterior sixth, seventh and eighth ribs. Judd Gaudier, MD 01/28/2023 3:10 AM     Plan of Care  Neuro: Acute pain 2/2 trauma; CTH w/ small tentorial SDH  - Obtain CT C spine  - Multimodal pain control  - NSGY consulted; appreciate rec'd  - q4hr NC, SBO goals < 150 per NSGY  - Seizure ppx: not indicated  HEENT: CT max face negative  Pulm: No Hx  -  Sat'ing well on RA  - Pulm toilet  CV: Hx of HTN, HLD  - HDS off pressors. Large drop in BP with hydral push x1  - Required nicardipine gtt at OSH for SBP goal < 150  - Continue home meds  - Continue to monitor  Endo: No Hx  - BG PRN  GI: Hx of GERD  - NPO, mIVFs  - PRN antiemetics  - Bowel reg w/ doc-senna, miralax  - GI ppx: pepcid  Heme/ID: No Hx  - Monitor for s/s of acute hemorrhage  - DVT ppx: SCDs, chemo ppx held in setting of SDH  - Afebrile  - Monitor for s/s of acute infection  - Abx not indicated  Renal: No Hx  - Monitor UOP  - Replace lytes PRN  - Foley: not indicated  Neuromuscular: Hx right shoulder replacement  - WBAT in all extremities  - PT/OT: yes  Primary Coverage:  Payor: MEDICARE [employer] / Plan: MEDICARE ADVANTAGE GENERIC / Product Type: MANAGED MEDICARE /     UTILIZATION REVIEW CONTACT: Harland German MSN RN  Utilization Review   [facility]  [phone]  [phone]  Email: [email or web address]  NPI:   [id number]  Tax ID:  [id number]         NOTES TO REVIEWER:    This clinical review is based on/compiled from documentation provided by the treatment team within the patient's medical record.

## 2023-01-29 NOTE — Plan of Care (Signed)
Problem: Pain interferes with ability to perform ADL  Goal: Pain at adequate level as identified by patient  01/29/2023 1529 by Nicholas Lose, RN  Outcome: Completed  01/29/2023 0824 by Nicholas Lose, RN  Outcome: Progressing  Flowsheets (Taken 01/29/2023 [phone])  Pain at adequate level as identified by patient:   Identify patient comfort function goal   Assess pain on admission, during daily assessment and/or before any "as needed" intervention(s)   Assess for risk of opioid induced respiratory depression, including snoring/sleep apnea. Alert healthcare team of risk factors identified.   Reassess pain within 30-60 minutes of any procedure/intervention, per Pain Assessment, Intervention, Reassessment (AIR) Cycle   Evaluate patient's satisfaction with pain management progress     Problem: Side Effects from Pain Analgesia  Goal: Patient will experience minimal side effects of analgesic therapy  01/29/2023 1529 by Nicholas Lose, RN  Outcome: Completed  01/29/2023 0824 by Nicholas Lose, RN  Outcome: Progressing  Flowsheets (Taken 01/29/2023 [phone])  Patient will experience minimal side effects of analgesic therapy: Monitor/assess patient's respiratory status (RR depth, effort, breath sounds)     Problem: Moderate/High Fall Risk Score >5  Goal: Patient will remain free of falls  01/29/2023 1529 by Nicholas Lose, RN  Outcome: Completed  01/29/2023 0824 by Nicholas Lose, RN  Outcome: Progressing  Flowsheets (Taken 01/29/2023 0824)  High (Greater than 13): HIGH-Bed alarm on at all times while patient in bed     Problem: Compromised Moisture  Goal: Moisture level Interventions  01/29/2023 1529 by Nicholas Lose, RN  Outcome: Completed  01/29/2023 0824 by Nicholas Lose, RN  Outcome: Progressing  Flowsheets (Taken 01/29/2023 [phone])  Moisture level Interventions: Moisture wicking products, Moisture barrier cream     Problem: Compromised Activity/Mobility  Goal: Activity/Mobility  Interventions  01/29/2023 1529 by Nicholas Lose, RN  Outcome: Completed  01/29/2023 0824 by Nicholas Lose, RN  Outcome: Progressing  Flowsheets (Taken 01/29/2023 [phone])  Activity/Mobility Interventions: Pad bony prominences, TAP Seated positioning system when OOB, Promote PMP, Reposition q 2 hrs / turn clock, Offload heels     Problem: Neurological Deficit  Goal: Neurological status is stable or improving  01/29/2023 1529 by Nicholas Lose, RN  Outcome: Completed  01/29/2023 0824 by Nicholas Lose, RN  Outcome: Progressing  Flowsheets (Taken 01/29/2023 [phone])  Neurological status is stable or improving: Monitor/assess/document neurological assessment (Stroke: every 4 hours)     Problem: Speech Language Pathology  Goal: By discharge, patient will demonstrate cognition, communication and safe swallowing at the patient's highest functional potential.  See SLP evaluation(s)/note for goals.  Outcome: Completed

## 2023-01-29 NOTE — Plan of Care (Signed)
Problem: Pain interferes with ability to perform ADL  Goal: Pain at adequate level as identified by patient  Outcome: Progressing  Flowsheets (Taken 01/29/2023 0824)  Pain at adequate level as identified by patient:   Identify patient comfort function goal   Assess pain on admission, during daily assessment and/or before any "as needed" intervention(s)   Assess for risk of opioid induced respiratory depression, including snoring/sleep apnea. Alert healthcare team of risk factors identified.   Reassess pain within 30-60 minutes of any procedure/intervention, per Pain Assessment, Intervention, Reassessment (AIR) Cycle   Evaluate patient's satisfaction with pain management progress     Problem: Side Effects from Pain Analgesia  Goal: Patient will experience minimal side effects of analgesic therapy  Outcome: Progressing  Flowsheets (Taken 01/29/2023 0824)  Patient will experience minimal side effects of analgesic therapy: Monitor/assess patient's respiratory status (RR depth, effort, breath sounds)     Problem: Moderate/High Fall Risk Score >5  Goal: Patient will remain free of falls  Outcome: Progressing  Flowsheets (Taken 01/29/2023 0824)  High (Greater than 13): HIGH-Bed alarm on at all times while patient in bed     Problem: Compromised Moisture  Goal: Moisture level Interventions  Outcome: Progressing  Flowsheets (Taken 01/29/2023 0824)  Moisture level Interventions: Moisture wicking products, Moisture barrier cream     Problem: Compromised Activity/Mobility  Goal: Activity/Mobility Interventions  Outcome: Progressing  Flowsheets (Taken 01/29/2023 0824)  Activity/Mobility Interventions: Pad bony prominences, TAP Seated positioning system when OOB, Promote PMP, Reposition q 2 hrs / turn clock, Offload heels     Problem: Neurological Deficit  Goal: Neurological status is stable or improving  Outcome: Progressing  Flowsheets (Taken 01/29/2023 0824)  Neurological status is stable or improving: Monitor/assess/document  neurological assessment (Stroke: every 4 hours)

## 2023-01-29 NOTE — ACP (Advance Care Planning) (Signed)
Patient Care Conference Note    Patient Name: April Foley:      Meeting Date: 01/29/23         Purpose of Meeting  Discuss Goals of care  Discuss code status    Meeting Participants   Patient  and Other consulting Physician    Medical Decision Maker      Does [name] have medical decision-making capacity?  Yes   The patient did  participate in the meeting.      Summary of Medical Condition/ Treatment Options/ Prognosis  Burden vs benefit of CPR, intubation, non invasive vent and pressors discussed.           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  She wants resuscitation and intubation and all treatments  She has taken care of herself and her health       Code Status and Medical Interventions Discussed   Code Status (patient has no pulse)  CPR - Comments: full code    Advance Directive Documentation  Existing Documents that were Reviewed/Discussed with Patient/Decision Maker:  None  Documents Filled Out as Part of Discussion:                None    Medical Decisions and Plan of Care  Full code      This information was shared with: Primary team    Start time: 10:08 am   End Time: 10:34 am     I have spent 16 minutes on face-to-face Advance Care Planning Services. 100% of the time was spent on discussion and counseling the patient. No active management of the problems listed above was undertaken during the time period reported.       Signed by: Karin Golden, MD

## 2023-01-29 NOTE — Progress Notes (Signed)
TACS Nursing Progress Note    April Foley is a 82 y.o. female  Admitted 01/28/2023  1:37 AM (Hospital day 1) for Subdural hemorrhage [I62.00]        Major Shift Events:  Pain controlled with scheduled medication  NAEON    Review of Systems  Neuro:  A&Ox4, FC, MAE  HOH B/L hearing aids  Q4h neuro checks intact  1x assist with can    Cardiac:  Temp:  [97.7 F (36.5 C)-98.8 F (37.1 C)] 97.8 F (36.6 C)  Heart Rate:  [53-96] 59  Resp Rate:  [18-21] 20  BP: (87-181)/(47-74) 109/63     Respiratory:  RA  LS clear/dim    GI/GU:  Regular diet   Voids to bathroom   LBM 5/22    BM this shift? no    Skin Assessment  Skin Integrity: Bruising  Bruising Skin Location: L eye periorbital edema     Braden Scale Score: 19 (01/28/23 2000)    LDAWs  Patient Lines/Drains/Airways Status       Active Lines, Drains and Airways       Name Placement date Placement time Site Days    Peripheral IV 01/28/23 22 G Diffusion Left Antecubital 01/28/23  --  Antecubital  1                    Psycho/Social:  Calm and cooperative    Disposition for Discharge:  TBD

## 2023-01-29 NOTE — Progress Notes (Signed)
Initial Case Management Assessment and Discharge Planning [facility]   Patient Name: April Foley, April Foley   Date of Birth Jan 16, 1941   Attending Physician: Irene Pap, DO   Primary Care Physician: Pcp, None, MD   Length of Stay 1   Reason for Consult / Chief Complaint Initial d/c assessment         Situation   Admission DX:   1. Subdural hemorrhage        A/O Status: X 3    LACE Score: 4    Patient admitted from: ER  Admission Status: inpatient    Health Care Agent: Self  Name: no HCPOA, pt's children April Foley and April Foley designated as medical surrogates  Phone number: [phone] and [phone]        Background     Advanced directive:   <no information>    has NO advance directive - not interested in additional information    Code Status:   Full Code     Residence: Multi-story home, 2 level home, 2 steps to enter from outside    PCP: PCP None, MD  Patient Contact:   [phone] (home)     [phone] (mobile)     Emergency contact:   Extended Emergency Contact Information  Primary Emergency Contact: April Foley,April Foley  Mobile Phone: [phone]  Relation: Son  Preferred language: English  Interpreter needed? No  Secondary Emergency Contact: April Foley,April Foley  Mobile Phone: [phone]  Relation: Son  Preferred language: English  Interpreter needed? No      ADL/IADL's: Independent  Previous Level of function: 7 Independent     DME: Single point cane    Pharmacy:     [id number] [place] Must[place], [place] - [id number] AT [facility] [address]  [phone] [address]  [place] [address]  Phone: [phone] Fax: [phone]      Prescription Coverage: Yes    Home Health: The patient is not currently receiving home health services.    Previous SNF/AR: none    COVID Vaccine Status: unknown    Date First IMM given: n/a  UAI on file?: No  Transport for discharge? Mode of transportation: Sales executive - Family/Friend to drive patient  Agreeable to Home with family post-discharge:   Yes     Assessment   CM reviewed pt's chart. Rec for home with supervision. Plans for d/c today. Met pt at bedside for assessment and d/c planning. Introduced self, role, and reason for visit. Assessment completed.   Pt in town for grand dtr graduation, fell down stairs at [employer].   Pt lives with son April Foley. Retired as a Lawyer. Confirmed insurance coverage. Has PCP, unable to list name.   Reports to being indep at baseline. Owns a cane. No previous hx of ARU/SNF. Does not drink alcohol or uses tobacco products. Described mental health as "good".   Informed pt of being rec for home with supervision and possible d/c today. Confirmed supervision with family. Transport via family.   BARRIERS TO DISCHARGE: none     Recommendation   D/C Plan A: Home with family     D/C to home today. Transport via family.     Larita Fife, MSW  Social Worker Case Manager  Care Management Department  [facility]

## 2023-01-29 NOTE — Final Progress Note (DC Note for stay less than 48 (Signed)
TRAUMA TERTIARY SURVEY FORM AND INITIAL PROGRESS NOTE       Interval History:   April Foley is a 82 y.o. female who was admitted 01/28/2023  1:37 AM to [facility] by Irene Pap, DO after fall down stairs. She sustained a small SDH. Remains neurointact.     NAEO. Feels well this AM. Feels ready to go home.     Substance usage:   none  The patient denies current or previous tobacco use.  denied.    SBRT (Screening, Brief Intervention, and Referral to Treatment) performed: Yes  CATS (Community addiction team) requested: N/A    Allergies:   No Known Allergies    Medical history:     Past Medical History:   Diagnosis Date    Hypertension        Medications:     Home Medications:   Prior to Admission medications    Medication Sig Start Date End Date Taking? Authorizing Provider   acetaminophen (TYLENOL) 500 MG tablet Take 1 tablet (500 mg) by mouth every 6 (six) hours as needed 07/04/21  Yes [provider]   albuterol (PROVENTIL) (2.5 MG/3ML) 0.083% nebulizer solution INHALE 1 VIAL IN NEBULIZER 4 TIMES DAILY AS NEEDED 11/07/22  Yes [provider]   albuterol sulfate HFA (PROVENTIL) 108 (90 Base) MCG/ACT inhaler Inhale 2 puffs into the lungs every 6 (six) hours as needed 07/24/21  Yes [provider]   dexAMETHasone (DECADRON) 0.1 % ophthalmic solution Place 2 drops of Dexamethasone drops into right ear twice a day for 7 days. Ok to use eye drop in the ear. Md aware. 05/10/21  Yes [provider]   doxycycline (VIBRA-TABS) 100 MG tablet  11/04/22  Yes [provider]   Flovent HFA 110 MCG/ACT inhaler Inhale 1 puff into the lungs 2 (two) times daily as needed 03/28/22  Yes [provider]   fluconazole (DIFLUCAN) 100 MG tablet take 1 tablet daily for thrush 11/18/22  Yes [provider]   fluticasone (FLONASE) 50 MCG/ACT nasal spray INSTILL 2 sprays in each nasal cavity, daily 01/19/23  Yes [provider]    hydrALAZINE (APRESOLINE) 25 MG tablet Take 0.5 tablets (12.5 mg) by mouth 05/14/21  Yes [provider]   isosorbide mononitrate (IMDUR) 30 MG 24 hr tablet Take 1 tablet (30 mg) by mouth daily 01/12/23  Yes [provider]   isosorbide mononitrate (IMDUR) 60 MG 24 hr tablet Take 1 tablet (60 mg) by mouth 06/16/22  Yes [provider]   lansoprazole (PREVACID) 30 MG capsule Take 1 capsule (30 mg) by mouth 06/16/22  Yes [provider]   latanoprost (XALATAN) 0.005 % ophthalmic solution instill 1 drop by ophthalmic route every day into both eyes in the evening 06/17/22  Yes [provider]   neomycin-polymyxin-dexameth (MAXITROL) 3.5-10000-0.1 Ointment apply to affected areas 3x per day for up to 1 week 06/16/22  Yes [provider]   nitroglycerin (NITROSTAT) 0.4 MG SL tablet DISSOLVE 1 TABLET UNDER THE TONGUE EVERY 5 MINUTES AS NEEDED FOR CHEST PAIN. DO NOT EXCEED A TOTAL OF 3 DOSES IN 15 MINUTES. 07/29/22  Yes [provider]   ondansetron (ZOFRAN-ODT) 8 MG disintegrating tablet Take 1 tablet (8 mg) by mouth every 8 (eight) hours 02/20/22  Yes [provider]   oseltamivir (TAMIFLU) 75 MG capsule  10/31/22  Yes [provider]   pantoprazole (PROTONIX) 40 MG tablet Take 1 tablet (40 mg) by mouth  daily 09/19/21  Yes [provider]   potassium chloride (MICRO-K) 10 MEQ CR capsule Take 1 capsule (10 mEq) by mouth daily 12/19/22  Yes [provider]   prednisoLONE acetate (PRED FORTE) 1 % ophthalmic suspension instill 1 drop by ophthalmic route 4 times every day in both eyes, for one week. Then discontinue use. 01/19/23  Yes [provider]   predniSONE (DELTASONE) 10 MG tablet TAKE 4 TABLETS DAILY FOR 3 DAYS; TAKE 3 TABLETS DAILY FOR 3 DAYS; TAKE 2 TABLETS DAILY FOR 3 DAYS; TAKE ONE TABLET DAILY FOR 3 DAYS 12/08/22  Yes [provider]   rosuvastatin (CRESTOR) 20 MG tablet Take 40 mg (1 tablet) three times a week.  01/13/23  Yes [provider]   rosuvastatin (CRESTOR) 40 MG tablet TAKE ONE TABLET BY MOUTH 3 TIMES WEEKLY 01/14/23  Yes [provider]   sucralfate (CARAFATE) 1 g tablet Take 1 tablet (1 g) by mouth daily 11/18/22  Yes [provider]   timolol (TIMOPTIC) 0.5 % ophthalmic solution INSTILL ONE DROP IN [facility] EYE twice daily 05/06/22  Yes [provider]   traMADol-acetaminophen (ULTRACET) 37.5-325 MG per tablet Take 1-2 tablets by mouth 2 (two) times daily as needed 02/14/21  Yes [provider]   Albuterol Sulfate, sensor, (ProAir Digihaler) 108 (90 Base) MCG/ACT Aerosol Pwdr, Breath Activated Inhale 2 puffs into the lungs every 6 (six) hours as needed    [provider]   ALPRAZolam (XANAX) 0.25 MG tablet Take 1 tablet (0.25 mg) by mouth 2 (two) times daily as needed    [provider]   aspirin 81 MG chewable tablet Chew 1 tablet (81 mg) by mouth daily    [provider]   carvedilol (COREG) 6.25 MG tablet Take 1 tablet (6.25 mg) by mouth    [provider]   celecoxib (CeleBREX) 100 MG capsule TAKE ONE CAPSULE DAILY FOR CHEST WALL PAIN    [provider]   cloNIDine (CATAPRES) 0.1 MG tablet Take 1 tablet (0.1 mg) by mouth daily as needed    [provider]   Coenzyme Q10 100 MG Tab Take 2 tablets (200 mg) by mouth    [provider]   Coenzyme Q10 200 MG capsule Take 1 capsule by mouth daily    [provider]   dorzolamide (TRUSOPT) 2 % ophthalmic solution Place 1 drop into both eyes 2 (two) times daily    [provider]   furosemide (LASIX) 40 MG tablet Take 0.5 tablets (20 mg) by mouth daily    [provider]   Linzess 72 MCG capsule 1 (one) Capsule daily PRN    [provider]   meloxicam (MOBIC) 15 MG tablet Take 1 tablet (15 mg) by mouth daily    [provider]   Multiple Vitamin (multivitamin) tablet Take 1 tablet by mouth daily    [provider]    potassium chloride (KLOR-CON) 20 MEQ packet Take 10 mEq by mouth daily    [provider]   ranolazine (RANEXA) 500 MG 12 hr tablet Take 1 tablet (500 mg) by mouth    [provider]   sodium chloride (MURO 128) 2 % ophthalmic solution Place 1 drop into both eyes nightly    [provider]     Scheduled Medications:   Current Facility-Administered Medications   Medication Dose Route Frequency    acetaminophen  650 mg Oral 4 times per day  carvedilol  6.25 mg Oral Q12H SCH    dorzolamide  1 drop Both Eyes Q12H SCH    furosemide  40 mg Oral Daily    isosorbide mononitrate  30 mg Oral Daily    latanoprost  1 drop Both Eyes QHS    levETIRAcetam  500 mg Oral Q12H SCH    methocarbamol  750 mg Oral QID    pantoprazole  40 mg Oral QAM AC    polyethylene glycol  17 g Oral Daily    rosuvastatin  40 mg Oral QHS    senna-docusate  1 tablet Oral QHS    timolol  1 drop Both Eyes Q12H SCH     Infusion Medications:     PRN Medications:   ALPRAZolam, oxyCODONE     Verify appropriate medications are reconciled: Yes    Review of systems since admission:   Review of Systems   Constitutional:  Negative for fever.   HENT:  Negative for ear discharge.    Eyes:  Negative for discharge and redness.   Respiratory:  Negative for shortness of breath and wheezing.    Cardiovascular:  Negative for chest pain and palpitations.   Gastrointestinal:  Negative for abdominal pain, nausea and vomiting.   Musculoskeletal:  Negative for back pain, joint pain and neck pain.   Neurological:  Negative for headaches.       Available radiology data:   No results found.     Current laboratory data:     Recent Labs   Lab 01/28/23  1250   WBC 7.36   RBC 5.63*   Hgb 15.0*   Hematocrit 47.1*   Platelets 206   Glucose 97   BUN 8.0   Creatinine 0.7   Calcium 9.8   Sodium 139   Potassium 3.9   Chloride 103   CO2 28       Physical examination:   Physical Exam    Vital signs:  Temp:  [97.7 F (36.5 C)-97.9 F (36.6 C)] 97.8 F (36.6  C)  Heart Rate:  [53-96] 54  Resp Rate:  [17-20] 17  BP: (87-158)/(47-73) 111/62    List of injuries by system:   Neurologic: SDH    Problem list:     Active Hospital Problems    Diagnosis    Subdural hemorrhage        Verify problem list is appropriately updated: Yes    Consulting services:   Neurosurgery - [facility]    Confirm consulting services have been notified: Yes    Assessment and plan:   Neuro: SDH, Acute pain 2/2 trauma, GCS 15  - Multimodal pain regimen  - NSGY: Outpatient f/u   Pulm: SORA  - Pulm toilet, IS  CV: HDS; PMHx HTN, HLD  - Monitor VS per protocol  - Continue home meds   Endo:  - BG PRN  GI:  - Regular diet   - Bowel regimen  Heme/ID:  - No signs of acute bleeding or infection  - Defer chemo DVT ppx  Renal:  - Monitor UOP  - Replete electrolytes as indicated   Neuromuscular: No obvious bony injuries  - PT/OT   Psych:  - Supportive   Wounds: No obvious wounds  - LWC PRN       Additional Diagnoses:           Signed by Asencion Gowda, MD 01/29/23 6:26 AM    Trauma surgeon attestation:     ATTENDING ATTESTATION    I have  seen the patient, duplicated the key portions of the exam and reviewed the flow sheet, labs and imaging studies and edited the note.  I agree with the assessment and plan.      Particia Lather, MD, FACS

## 2023-01-29 NOTE — Discharge Summary -  Nursing (Signed)
Patient condition improved and now medically ready for discharge. Peripheral IV to LAC removed. Discharge education provided. All questions asked and answered. Patient escorted off unit in wheelchair with clinical technician.  Patient to return home for self care.

## 2023-01-29 NOTE — SLP Eval Note (Signed)
[facility]   Speech and Language Therapy Evaluation     Patient: April Foley    MRN#: [id number]   Unit:  [facility] TOWER 3    Assessment:   April Foley is a 82 y.o. female admitted 01/28/2023 for Subdural hemorrhage [I62.00] presenting with mild cognitive deficits that are believed to be at least partially baseline in nature. Deficits are present in recall and attention. Pt reports baseline recall deficits, but does report ind med management, finances and driving. SLP recommend oversight and supervision with iADLs upon return home and clearance from PCP for driving. No further SLP services are indicated at this level of care, follow-up as an outpatient if deficits persist.     Therapy Diagnosis: cognitive deficits     Discharge Recommendations:   Expected disposition: Recommendations: Defer to PT/OT recommendation     Plan:   Plan:     SLP Frequency Recommended: one time visit       History of Present Illness:   Medical Diagnosis: Subdural hemorrhage [I62.00]    History of Present Illness: April Foley is a 82 y.o. female admitted on 01/28/2023 with "fall down stairs. She sustained a small SDH." Per progress note      Imaging:   CT Cervical Spine WO Contrast    Result Date: 01/28/2023   1.No evidence of acute fracture or traumatic malalignment in the cervical spine. 2.Degenerative changes. April Murders, MD 01/28/2023 5:29 AM    XR Chest  AP Portable    Result Date: 01/28/2023  1. No acute pulmonary process. 2. Age-indeterminate nondisplaced fractures of the left posterior sixth, seventh and eighth ribs. April Gaudier, MD 01/28/2023 3:10 AM       Patient Active Problem List   Diagnosis    Subdural hemorrhage       Past Medical/Surgical History:  Past Medical History:   Diagnosis Date    Hypertension       History reviewed. No pertinent surgical history.     Social History:     Home Living Arrangements  Living Arrangements: Children    Subjective:   Patient is  agreeable to participation in the therapy session. Nursing clears patient for therapy. Patient's medical condition is appropriate for Speech therapy intervention at this time.       PAIN:  denies     Objective:   Observation of Patient/Vital Signs:  Patient is in bed with dressings and telemetry in place.  Interpreter services required: N/A  Patient was wearing a mask: No    Precautions: none at this time     Memory: Comments:   Orientation   - Oriented to self: +  - Oriented to location: +  - Oriented to circumstance: +  - Oriented to date: +  - Immediate Recall: 3/3   - Digit span: 2/2  - Delayed recall (5 minute delay): 1/3 ind, 2/3 with semantic cue     Attention: Comments:   - Vigilance task: 100% acc  - Sequencing months forward: +  - Generative naming: 13 provided in 1 minute     Reasoning and Insight: Comments:   Concrete:   Simple sequencing: 3/3  Abstract:   - Comparing unlike items: 3/3  Safety-awareness/insight:  - Pt identifies deficits: identifies recall deficits at baseline   - Use of call bell: +  - Identifies reasons for remaining in hospital: +    Speech and Voice: Comments:   Intelligibility: 100%   Vocal quality:  clear and strong   Oral motor assessment: WNL    Language  Auditory comprehension: follows 3 step directives with 100% acc   Reading Comprehension: DNT  Verbal Expression: verbal  Written Expression: DNT    Pragmatics:  Good eye contact, good topic maintenance     Behavior:  Calm, cooperative, anxious for d/c       Educated the patient to role of speech therapy, plan of care, goals of therapy and nature of deficits and need for assistance. Pt and son in agreement.    Patient left with call bell within reach, all needs met, SCDs in place, fall mat in place, and bed alarm activated and all questions answered. RN notified of session outcome and patient response.    Goals:   N/A    April Foley, M.Ed. CCC-SLP  Available via Epic chat  PPE Worn by Provider: gloves and face mask       Time of  treatment:   SLP Received On: 01/29/23  Start Time: 1400  Stop Time: 1420  Time Calculation (min): 20 min

## 2023-01-29 NOTE — Consults (Addendum)
[facility]  Available by Epic chat or  Xtend Pager: [phone]  Mon-Fri 8 am till 4:30 pm         GERIATRIC MEDICINE CONSULTATION    Date Time: 01/29/23 10:37 AM  Patient Name: April Foley  Requesting Physician: Irene Pap, DO  Primary Care Physician: Marisa Sprinkles, MD      Reason for Consultation:    Frailty    Assessment/Plan:      April Foley is a 82 y.o. female with HTN, HLD who presents to the hospital on 01/28/2023 from OSH with s/p fall found to have SDH        Mentation         Recommend precaution for risk of Delirium: CAM negative  Recommend continued family presence and reorientation activities.   Recommend avoiding benzos, hypnotics, and anticholinergics if possible  Address hydration, nutrition, mobility, pain, constipation, sleep appropriately    Agitation:    If becomes agitated and not able to be redirectable, recommend low dose haldol 0.5mg  IV q6h prn.  mRASS +4 Combative No attention; overtly combative, violent, immediate danger to staff  Monitor EKG for QTc prolongation. Correct Mg, phos, K appropriately.         Functional Assessment Scale At baseline- no concerns for memory loss or cognitive impairment, pt is high functioning   1 - No difficulty either subjectively or objectively.        Anxiety:   Home med Alprazolam 0.25 mg po q bid prn  Avoid if possible, high risk for fall and delirium      Mobility    Frailty:   Clinical Frailty Score: 1- Very fit (active, robust)     KATZ score: 6 of 6: Full function,          Gait instability and falls:     Appreciate PT/OT reca  Goal vit D level 30-40,   recommend  vit D supplementation to 50,000 IU qweek x 4-12 weeks    Add calcium carbonate 750 mg supplements bid    See anxiety       Pain:     Recommend Tylenol  Avoid oxycodone       Potentially inappropriate medications(PIM)    Polypharmacy:  Reviewed and patient is not on and continue to avoid  benzos, hypnotics, high dose opioids and anticholinergics if  possible      What matters the most for the patient: to attend grand daughter's graduation     Goals of care:   Burden vs benefit of CPR, intubation, non invasive vent and pressors discussed.      She wants to be full code    Advance care plan:    Advance Directives Patient does not have advance directive   Medical POA/Health agent Name:          Kushner,REGINALD            Relationship:  Son         Contact Number:     CURRENT Code Status:  Full Code  Discussed/reconfirmed with Patient/Health Care Agent:  yes    Discharge plan: back to son's home      Discussed directly with:pt, primary team     Total time: in addition to level 3 intial consult, 16_ Minutes in ACP with        April Foley, M.D.  [facility]    Geriatric consult [phone]    Chief Complaints/History of Present illness:   April  Foley [place]wood is a 82 y.o. female with HTN, HLD who presents to the hospital on 01/28/2023 from OSH with s/p fall found to have SDH    History reviewed, summarized and obtained from:   [x]  Chart  [x]   Patient  []  Family []  Medical team  []  Other:     Pt is from [place] where she lives alone  She still works as a Air cabin crew  She has slowed down, she does not like to travel, she is driving only short distances  She walks without assistive device  Has not fallen before  She is visiting [place] to attend her grand daughter's high school graduation   She was walking down the stairs which was brown like the floor so she missed a step     Swelling on the forehead and chest is sore    She is independent in all ADLs, IADLs       Past Medical History:     Past Medical History:   Diagnosis Date    Hypertension        Past Surgical History:   History reviewed. No pertinent surgical history.    Functional History:      ADL                          IADL  ACTIVITY   ACTIVITY      Bathing Independent   Ability to use phone Independent     Ambulation  Independent   Food preparation Independent     Dressing  Independent   Shopping Independent     Toileting Independent   Housekeeping Independent     Grooming Independent   Finances    Independent     Eating Independent   Medication Management Independent       Transportation Independent          Does the patient still drive?  [x] Yes   [] No    Any accidents, near misses or problems with getting lost?  [] Yes   [x] No    Family History:   History reviewed. No pertinent family history.    Social History:            Occupation: Manufacturing systems engineer and now CNA  Highest level of education:College degree   Living environment/location: Home/Apartment  Lives with:Alone  Caregiver/Services:None          Allergies:   No Known Allergies    Medications:     Medications Prior to Admission   Medication Sig    acetaminophen (TYLENOL) 500 MG tablet Take 1 tablet (500 mg) by mouth every 6 (six) hours as needed    albuterol (PROVENTIL) (2.5 MG/3ML) 0.083% nebulizer solution INHALE 1 VIAL IN NEBULIZER 4 TIMES DAILY AS NEEDED    albuterol sulfate HFA (PROVENTIL) 108 (90 Base) MCG/ACT inhaler Inhale 2 puffs into the lungs every 6 (six) hours as needed    dexAMETHasone (DECADRON) 0.1 % ophthalmic solution Place 2 drops of Dexamethasone drops into right ear twice a day for 7 days. Ok to use eye drop in the ear. Md aware.    doxycycline (VIBRA-TABS) 100 MG tablet     Flovent HFA 110 MCG/ACT inhaler Inhale 1 puff into the lungs 2 (two) times daily as needed    fluconazole (DIFLUCAN) 100 MG tablet take 1 tablet daily for thrush    fluticasone (FLONASE) 50 MCG/ACT nasal spray INSTILL 2 sprays in each nasal cavity, daily    hydrALAZINE (APRESOLINE)  25 MG tablet Take 0.5 tablets (12.5 mg) by mouth    isosorbide mononitrate (IMDUR) 30 MG 24 hr tablet Take 1 tablet (30 mg) by mouth daily    isosorbide mononitrate (IMDUR) 60 MG 24 hr tablet Take 1 tablet (60 mg) by mouth    lansoprazole (PREVACID) 30 MG capsule Take 1 capsule (30 mg) by mouth    latanoprost (XALATAN) 0.005 % ophthalmic solution instill 1  drop by ophthalmic route every day into both eyes in the evening    neomycin-polymyxin-dexameth (MAXITROL) 3.5-10000-0.1 Ointment apply to affected areas 3x per day for up to 1 week    nitroglycerin (NITROSTAT) 0.4 MG SL tablet DISSOLVE 1 TABLET UNDER THE TONGUE EVERY 5 MINUTES AS NEEDED FOR CHEST PAIN. DO NOT EXCEED A TOTAL OF 3 DOSES IN 15 MINUTES.    ondansetron (ZOFRAN-ODT) 8 MG disintegrating tablet Take 1 tablet (8 mg) by mouth every 8 (eight) hours    oseltamivir (TAMIFLU) 75 MG capsule     pantoprazole (PROTONIX) 40 MG tablet Take 1 tablet (40 mg) by mouth daily    potassium chloride (MICRO-K) 10 MEQ CR capsule Take 1 capsule (10 mEq) by mouth daily    prednisoLONE acetate (PRED FORTE) 1 % ophthalmic suspension instill 1 drop by ophthalmic route 4 times every day in both eyes, for one week. Then discontinue use.    predniSONE (DELTASONE) 10 MG tablet TAKE 4 TABLETS DAILY FOR 3 DAYS; TAKE 3 TABLETS DAILY FOR 3 DAYS; TAKE 2 TABLETS DAILY FOR 3 DAYS; TAKE ONE TABLET DAILY FOR 3 DAYS    rosuvastatin (CRESTOR) 20 MG tablet Take 40 mg (1 tablet) three times a week.    rosuvastatin (CRESTOR) 40 MG tablet TAKE ONE TABLET BY MOUTH 3 TIMES WEEKLY    sucralfate (CARAFATE) 1 g tablet Take 1 tablet (1 g) by mouth daily    timolol (TIMOPTIC) 0.5 % ophthalmic solution INSTILL ONE DROP IN [facility] EYE twice daily    traMADol-acetaminophen (ULTRACET) 37.5-325 MG per tablet Take 1-2 tablets by mouth 2 (two) times daily as needed    Albuterol Sulfate, sensor, (ProAir Digihaler) 108 (90 Base) MCG/ACT Aerosol Pwdr, Breath Activated Inhale 2 puffs into the lungs every 6 (six) hours as needed    ALPRAZolam (XANAX) 0.25 MG tablet Take 1 tablet (0.25 mg) by mouth 2 (two) times daily as needed    aspirin 81 MG chewable tablet Chew 1 tablet (81 mg) by mouth daily    carvedilol (COREG) 6.25 MG tablet Take 1 tablet (6.25 mg) by mouth    celecoxib (CeleBREX) 100 MG capsule TAKE ONE CAPSULE DAILY FOR CHEST WALL PAIN    cloNIDine (CATAPRES)  0.1 MG tablet Take 1 tablet (0.1 mg) by mouth daily as needed    Coenzyme Q10 100 MG Tab Take 2 tablets (200 mg) by mouth    Coenzyme Q10 200 MG capsule Take 1 capsule by mouth daily    dorzolamide (TRUSOPT) 2 % ophthalmic solution Place 1 drop into both eyes 2 (two) times daily    furosemide (LASIX) 40 MG tablet Take 0.5 tablets (20 mg) by mouth daily    Linzess 72 MCG capsule 1 (one) Capsule daily PRN    meloxicam (MOBIC) 15 MG tablet Take 1 tablet (15 mg) by mouth daily    Multiple Vitamin (multivitamin) tablet Take 1 tablet by mouth daily    potassium chloride (KLOR-CON) 20 MEQ packet Take 10 mEq by mouth daily    ranolazine (RANEXA) 500 MG 12 hr tablet Take  1 tablet (500 mg) by mouth    sodium chloride (MURO 128) 2 % ophthalmic solution Place 1 drop into both eyes nightly     Current Facility-Administered Medications   Medication Dose Route Frequency    acetaminophen  650 mg Oral 4 times per day    carvedilol  6.25 mg Oral Q12H SCH    dorzolamide  1 drop Both Eyes Q12H SCH    furosemide  40 mg Oral Daily    isosorbide mononitrate  30 mg Oral Daily    latanoprost  1 drop Both Eyes QHS    levETIRAcetam  500 mg Oral Q12H SCH    methocarbamol  750 mg Oral QID    pantoprazole  40 mg Oral QAM AC    polyethylene glycol  17 g Oral Daily    rosuvastatin  40 mg Oral QHS    senna-docusate  1 tablet Oral QHS    timolol  1 drop Both Eyes Q12H SCH       ALPRAZolam, oxyCODONE    Review of Systems:        Comprehensive 12-point review of systems completed and negative besides as listed in the HPI.       Physical Exam:   BP  Min: 87/47  Max: 129/58  Temp  Avg: 97.8 F (36.6 C)  Min: 97.7 F (36.5 C)  Max: 97.9 F (36.6 C)  Pulse  Avg: 65  Min: 53  Max: 96  Resp  Avg: 18.7  Min: 17  Max: 21  Vitals:    01/29/23 0736   BP: 128/70   Pulse: 68   Resp: 21   Temp: 97.7 F (36.5 C)   SpO2: 99%       Intake/Output Summary (Last 24 hours) at 01/29/2023 1037  Last data filed at 01/29/2023 0800  Gross per 24 hour   Intake 1320 ml    Output --   Net 1320 ml       General: awake, alert, oriented x 3; no acute distress.   HEENT: perrla, eomi, sclera anicteric, mucous membranes moist L supra orbital area swelling hematoma present, periorbital ecchymosis  Neck: supple, no lymphadenopathy  CV: regular rate and rhythm, no murmurs, rubs or gallops  Lungs: breathing comfortably, no accessory muscle use  Abd: not distended  Ext: no clubbing, cyanosis, or edema  Neuro: no focal deficit in upper and lower extremities, sensation grossly intact   Skin: no rashes or lesions grossly noted    Mental Status:     Confusion Assessment Method (CAM)- for Delirium:      Negative for delirium      Labs Reviewed:     Recent Labs     01/28/23  1250   WBC 7.36   Hgb 15.0*   Hematocrit 47.1*   Platelets 206       Recent Labs     01/28/23  1250   Sodium 139   Potassium 3.9   Chloride 103   CO2 28   BUN 8.0   Creatinine 0.7   Glucose 97   Calcium 9.8       No results for input(s): "AST", "ALT", "ALKPHOS", "PROT", "ALB" in the last 72 hours.    Recent Labs     01/28/23  1250   PT 13.1*   PT INR 1.1       No results found for: "TSH"    No results found for: "HGBA1C"    No results found for: "LDL", "HDL"  Rads:   Radiological Procedure reviewed.   Radiology Results (24 Hour)       ** No results found for the last 24 hours. **            EKG:  CXR:    April Foley, M.D.  [facility]    Geriatric consult [phone]

## 2023-01-29 NOTE — UM Notes (Signed)
PATIENT NAME: ELMO, BOLDS   DOB: 02-21-1941     Admission Order:   Place for Observation Services (Order #[id number]) on 01/29/23     Admit Diagnosis: Subdural hemorrhage [I62.00]  Facility: [facility]    Reason for Admission: 82 y.o. female who was admitted 01/28/2023  1:37 AM to [facility] by Irene Pap, DO after fall down stairs. She sustained a small SDH. Remains neurointact.     Vital Signs:Temp:  [97.7 F (36.5 C)-98.1 F (36.7 C)] 98.1 F (36.7 C)  Heart Rate:  [53-68] 60  Resp Rate:  [17-21] 21  BP: (87-128)/(47-70) 123/67    Medications;Scheduled Meds:  Current Facility-Administered Medications   Medication Dose Route Frequency    acetaminophen  650 mg Oral 4 times per day    carvedilol  6.25 mg Oral Q12H SCH    dorzolamide  1 drop Both Eyes Q12H SCH    furosemide  40 mg Oral Daily    isosorbide mononitrate  30 mg Oral Daily    latanoprost  1 drop Both Eyes QHS    levETIRAcetam  500 mg Oral Q12H SCH    methocarbamol  750 mg Oral QID    pantoprazole  40 mg Oral QAM AC    polyethylene glycol  17 g Oral Daily    rosuvastatin  40 mg Oral QHS    senna-docusate  1 tablet Oral QHS    timolol  1 drop Both Eyes Q12H [facility]     Imaging;CT Cervical Spine WO Contrast    Result Date: 01/28/2023   1.No evidence of acute fracture or traumatic malalignment in the cervical spine. 2.Degenerative changes. Vernie Murders, MD 01/28/2023 5:29 AM    XR Chest  AP Portable    Result Date: 01/28/2023  1. No acute pulmonary process. 2. Age-indeterminate nondisplaced fractures of the left posterior sixth, seventh and eighth ribs. Judd Gaudier, MD 01/28/2023 3:10 AM     Plan of Care  Neuro: SDH, Acute pain 2/2 trauma, GCS 15  - Multimodal pain regimen  - NSGY: Outpatient f/u   Pulm: SORA  - Pulm toilet, IS  CV: HDS; PMHx HTN, HLD  - Monitor VS per protocol  - Continue home meds   Endo:  - BG PRN  GI:  - Regular diet   - Bowel regimen  Heme/ID:  - No signs of acute bleeding or  infection  - Defer chemo DVT ppx  Renal:  - Monitor UOP  - Replete electrolytes as indicated   Neuromuscular: No obvious bony injuries  - PT/OT   Psych:  - Supportive   Wounds: No obvious wounds  - LWC PRN     Primary Coverage:  Payor: MEDICARE [employer] / Plan: MEDICARE ADVANTAGE GENERIC / Product Type: MANAGED MEDICARE /     UTILIZATION REVIEW CONTACT: Harland German MSN RN   Utilization Review   Ms [facility]  [phone]  [phone]  Email: [email or web address]  NPI:   [id number]  Tax ID:  [id number]         NOTES TO REVIEWER:    This clinical review is based on/compiled from documentation provided by the treatment team within the patient's medical record.

## 2023-01-30 ENCOUNTER — Other Ambulatory Visit: Payer: Self-pay | Admitting: Physician Assistant

## 2023-01-30 DIAGNOSIS — I6200 Nontraumatic subdural hemorrhage, unspecified: Secondary | ICD-10-CM

## 2023-02-03 DIAGNOSIS — H1132 Conjunctival hemorrhage, left eye: Secondary | ICD-10-CM | POA: Diagnosis not present

## 2023-02-03 DIAGNOSIS — W19XXXA Unspecified fall, initial encounter: Secondary | ICD-10-CM | POA: Diagnosis not present

## 2023-02-03 DIAGNOSIS — I517 Cardiomegaly: Secondary | ICD-10-CM | POA: Diagnosis not present

## 2023-02-03 DIAGNOSIS — R079 Chest pain, unspecified: Secondary | ICD-10-CM | POA: Diagnosis not present

## 2023-02-03 DIAGNOSIS — S0003XA Contusion of scalp, initial encounter: Secondary | ICD-10-CM | POA: Diagnosis not present

## 2023-02-03 DIAGNOSIS — S0512XA Contusion of eyeball and orbital tissues, left eye, initial encounter: Secondary | ICD-10-CM | POA: Diagnosis not present

## 2023-02-03 DIAGNOSIS — R9431 Abnormal electrocardiogram [ECG] [EKG]: Secondary | ICD-10-CM | POA: Diagnosis not present

## 2023-02-03 DIAGNOSIS — R0789 Other chest pain: Secondary | ICD-10-CM | POA: Diagnosis not present

## 2023-02-05 ENCOUNTER — Telehealth (INDEPENDENT_AMBULATORY_CARE_PROVIDER_SITE_OTHER): Payer: Self-pay | Admitting: Student in an Organized Health Care Education/Training Program

## 2023-02-05 DIAGNOSIS — H6991 Unspecified Eustachian tube disorder, right ear: Secondary | ICD-10-CM | POA: Diagnosis not present

## 2023-02-05 DIAGNOSIS — H90A22 Sensorineural hearing loss, unilateral, left ear, with restricted hearing on the contralateral side: Secondary | ICD-10-CM | POA: Diagnosis not present

## 2023-02-05 DIAGNOSIS — S0012XA Contusion of left eyelid and periocular area, initial encounter: Secondary | ICD-10-CM

## 2023-02-05 DIAGNOSIS — S0083XA Contusion of other part of head, initial encounter: Secondary | ICD-10-CM | POA: Insufficient documentation

## 2023-02-05 DIAGNOSIS — H9201 Otalgia, right ear: Secondary | ICD-10-CM | POA: Diagnosis not present

## 2023-02-05 DIAGNOSIS — Z01118 Encounter for examination of ears and hearing with other abnormal findings: Secondary | ICD-10-CM | POA: Diagnosis not present

## 2023-02-05 DIAGNOSIS — Z9889 Other specified postprocedural states: Secondary | ICD-10-CM | POA: Diagnosis not present

## 2023-02-05 DIAGNOSIS — H1132 Conjunctival hemorrhage, left eye: Secondary | ICD-10-CM | POA: Diagnosis not present

## 2023-02-05 DIAGNOSIS — S0512XA Contusion of eyeball and orbital tissues, left eye, initial encounter: Secondary | ICD-10-CM | POA: Diagnosis not present

## 2023-02-05 DIAGNOSIS — R2689 Other abnormalities of gait and mobility: Secondary | ICD-10-CM | POA: Diagnosis not present

## 2023-02-05 DIAGNOSIS — H7491 Unspecified disorder of right middle ear and mastoid: Secondary | ICD-10-CM | POA: Diagnosis not present

## 2023-02-05 DIAGNOSIS — H90A31 Mixed conductive and sensorineural hearing loss, unilateral, right ear with restricted hearing on the contralateral side: Secondary | ICD-10-CM | POA: Diagnosis not present

## 2023-02-05 DIAGNOSIS — Z974 Presence of external hearing-aid: Secondary | ICD-10-CM | POA: Diagnosis not present

## 2023-02-05 HISTORY — DX: Contusion of left eyelid and periocular area, initial encounter: S00.12XA

## 2023-02-05 HISTORY — DX: Contusion of other part of head, initial encounter: S00.83XA

## 2023-02-05 NOTE — Telephone Encounter (Signed)
Confirmed H&P from Dr.Tabello, CT scan and [facility]urilogy notes from date of admission: 05.22.24 were faxed to [facility] [place] on 05.28.2024

## 2023-02-11 DIAGNOSIS — H402232 Chronic angle-closure glaucoma, bilateral, moderate stage: Secondary | ICD-10-CM | POA: Diagnosis not present

## 2023-02-12 ENCOUNTER — Encounter (INDEPENDENT_AMBULATORY_CARE_PROVIDER_SITE_OTHER): Payer: Self-pay | Admitting: Student in an Organized Health Care Education/Training Program

## 2023-02-12 NOTE — Progress Notes (Addendum)
Confirmed H&P from Admission with Dr.Tabello was faxed to [facility] on 06.04.2024

## 2023-02-17 DIAGNOSIS — K219 Gastro-esophageal reflux disease without esophagitis: Secondary | ICD-10-CM | POA: Diagnosis not present

## 2023-02-17 DIAGNOSIS — S060X0D Concussion without loss of consciousness, subsequent encounter: Secondary | ICD-10-CM | POA: Diagnosis not present

## 2023-02-17 DIAGNOSIS — Z6827 Body mass index (BMI) 27.0-27.9, adult: Secondary | ICD-10-CM | POA: Diagnosis not present

## 2023-03-07 ENCOUNTER — Other Ambulatory Visit: Payer: Self-pay

## 2023-03-17 DIAGNOSIS — E782 Mixed hyperlipidemia: Secondary | ICD-10-CM | POA: Diagnosis not present

## 2023-03-18 LAB — HEPATIC FUNCTION PANEL
ALT: 12 IU/L (ref 0–32)
AST: 15 IU/L (ref 0–40)
Albumin: 4.2 g/dL (ref 3.7–4.7)
Alkaline Phosphatase: 77 IU/L (ref 44–121)
Bilirubin Total: 0.3 mg/dL (ref 0.0–1.2)
Bilirubin, Direct: 0.12 mg/dL (ref 0.00–0.40)
Total Protein: 6.9 g/dL (ref 6.0–8.5)

## 2023-03-18 LAB — LIPID PANEL
Chol/HDL Ratio: 3.1 ratio (ref 0.0–4.4)
Cholesterol, Total: 169 mg/dL (ref 100–199)
HDL: 55 mg/dL (ref >39)
LDL Chol Calc (NIH): 98 mg/dL (ref 0–99)
Triglycerides: 88 mg/dL (ref 0–149)
VLDL Cholesterol Cal: 16 mg/dL (ref 5–40)

## 2023-03-26 DIAGNOSIS — Z6827 Body mass index (BMI) 27.0-27.9, adult: Secondary | ICD-10-CM | POA: Diagnosis not present

## 2023-03-26 DIAGNOSIS — S060X0D Concussion without loss of consciousness, subsequent encounter: Secondary | ICD-10-CM | POA: Diagnosis not present

## 2023-03-26 DIAGNOSIS — K219 Gastro-esophageal reflux disease without esophagitis: Secondary | ICD-10-CM | POA: Diagnosis not present

## 2023-03-30 ENCOUNTER — Other Ambulatory Visit: Payer: Self-pay | Admitting: Family Medicine

## 2023-03-30 DIAGNOSIS — S069X1A Unspecified intracranial injury with loss of consciousness of 30 minutes or less, initial encounter: Secondary | ICD-10-CM

## 2023-04-15 DIAGNOSIS — R0981 Nasal congestion: Secondary | ICD-10-CM | POA: Diagnosis not present

## 2023-04-15 DIAGNOSIS — H9202 Otalgia, left ear: Secondary | ICD-10-CM | POA: Diagnosis not present

## 2023-04-15 DIAGNOSIS — R051 Acute cough: Secondary | ICD-10-CM | POA: Diagnosis not present

## 2023-04-15 DIAGNOSIS — H6123 Impacted cerumen, bilateral: Secondary | ICD-10-CM | POA: Diagnosis not present

## 2023-04-15 DIAGNOSIS — H6092 Unspecified otitis externa, left ear: Secondary | ICD-10-CM | POA: Diagnosis not present

## 2023-04-27 ENCOUNTER — Ambulatory Visit
Admission: RE | Admit: 2023-04-27 | Discharge: 2023-04-27 | Disposition: A | Discharge status: Home / Self Care | Payer: PPO | Source: Ambulatory Visit | Attending: Family Medicine | Admitting: Family Medicine

## 2023-04-27 DIAGNOSIS — S0083XA Contusion of other part of head, initial encounter: Secondary | ICD-10-CM | POA: Diagnosis not present

## 2023-04-27 DIAGNOSIS — R519 Headache, unspecified: Secondary | ICD-10-CM | POA: Diagnosis not present

## 2023-04-27 DIAGNOSIS — S069X1A Unspecified intracranial injury with loss of consciousness of 30 minutes or less, initial encounter: Secondary | ICD-10-CM

## 2023-05-04 DIAGNOSIS — M19011 Primary osteoarthritis, right shoulder: Secondary | ICD-10-CM | POA: Diagnosis not present

## 2023-05-07 DIAGNOSIS — B349 Viral infection, unspecified: Secondary | ICD-10-CM | POA: Diagnosis not present

## 2023-05-15 DIAGNOSIS — J329 Chronic sinusitis, unspecified: Secondary | ICD-10-CM | POA: Diagnosis not present

## 2023-05-15 DIAGNOSIS — Z6827 Body mass index (BMI) 27.0-27.9, adult: Secondary | ICD-10-CM | POA: Diagnosis not present

## 2023-05-22 DIAGNOSIS — M954 Acquired deformity of chest and rib: Secondary | ICD-10-CM | POA: Diagnosis not present

## 2023-05-22 DIAGNOSIS — Z6827 Body mass index (BMI) 27.0-27.9, adult: Secondary | ICD-10-CM | POA: Diagnosis not present

## 2023-05-22 DIAGNOSIS — R0789 Other chest pain: Secondary | ICD-10-CM | POA: Diagnosis not present

## 2023-05-29 DIAGNOSIS — R0789 Other chest pain: Secondary | ICD-10-CM | POA: Diagnosis not present

## 2023-05-29 DIAGNOSIS — K219 Gastro-esophageal reflux disease without esophagitis: Secondary | ICD-10-CM | POA: Diagnosis not present

## 2023-05-29 DIAGNOSIS — Z6827 Body mass index (BMI) 27.0-27.9, adult: Secondary | ICD-10-CM | POA: Diagnosis not present

## 2023-06-01 ENCOUNTER — Telehealth: Payer: Self-pay | Admitting: Cardiology

## 2023-06-01 NOTE — Telephone Encounter (Signed)
Spoke with pt who states that she did not take NTG yesterday but has had continued chest tightness. Pt request an appointment. Advised to call 911 and go to the ED for recurrent pain. Appointment made and pt verbalized understanding.

## 2023-06-01 NOTE — Telephone Encounter (Signed)
   Pt c/o of Chest Pain: STAT if active CP, including tightness, pressure, jaw pain, radiating pain to shoulder/upper arm/back, CP unrelieved by Nitro. Symptoms reported of SOB, nausea, vomiting, sweating.  1. Are you having CP right now? Yes, tightness in chest & shoulders    2. Are you experiencing any other symptoms (ex. SOB, nausea, vomiting, sweating)? Sweating spells Saturday and yesterday, and lightheadedness.    3. Is your CP continuous or coming and going? Continuous, since acid reflux issue Friday.    4. Have you taken Nitroglycerin? Yes, Saturday.    5. How long have you been experiencing CP? Since 09/20  Transferring call to RN due to being STAT.   6. If NO CP at time of call then end call with telling Pt to call back or call 911 if Chest pain returns prior to return call from triage team.

## 2023-06-02 ENCOUNTER — Ambulatory Visit: Payer: PPO

## 2023-06-02 VITALS — BP 150/80 | HR 62 | Ht 63.0 in | Wt 154.6 lb

## 2023-06-02 DIAGNOSIS — I350 Nonrheumatic aortic (valve) stenosis: Secondary | ICD-10-CM

## 2023-06-02 DIAGNOSIS — I517 Cardiomegaly: Secondary | ICD-10-CM

## 2023-06-02 DIAGNOSIS — R0789 Other chest pain: Secondary | ICD-10-CM

## 2023-06-02 DIAGNOSIS — I10 Essential (primary) hypertension: Secondary | ICD-10-CM | POA: Diagnosis not present

## 2023-06-02 HISTORY — DX: Other chest pain: R07.89

## 2023-06-02 HISTORY — DX: Cardiomegaly: I51.7

## 2023-06-02 NOTE — Assessment & Plan Note (Signed)
Observed on echocardiography and consistent with the EKG findings.  Feels somewhat out of proportion to her history of mild aortic stenosis and hypertension.  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 She has not had any dangerous signs such as syncope or near syncope.  She denies any palpitations. She does not have any significant family history is suggestive of sudden cardiac death.  In the situation we will hold off on further testing for hypertrophic cardiomyopathy as management is unlikely to change.  I advised her to keep herself well-hydrated. If the blood pressure is suboptimally controlled, the recommendation would be to titrate up the dose of beta-blocker and avoid medications such as diuretics.  If she does have symptoms of palpitations or lightheadedness, we can consider heart monitor to rule out any cardiac arrhythmias.

## 2023-06-02 NOTE — Progress Notes (Signed)
Cardiology Consultation:    Date:  06/02/2023   ID:  LATRESSA BANGE, DOB [birth year], MRN [id number]  PCP:  Lise Auer, MD  Cardiologist:  Marlyn Corporal Elzena Muston, MD   Referring MD: Lise Auer, MD   Chief Complaint  Patient presents with   Chest Pain   Dizziness   Shortness of Breath     ASSESSMENT AND PLAN:   Ms Poncedeleon very pleasant 82 year old female with history of mild nonobstructive coronary artery disease based on workup with cardiac catheterization in 2019 and CT coronary angiogram in April 2024, no ischemia on Lexiscan stress test in February 2024, mild aortic stenosis, mild aortic insufficiency, hypertension, hyperlipidemia with features of 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  Here for evaluation of ongoing atypical chest discomfort symptoms more suggestive of heartburn symptoms.  She is pending further evaluation with GI service and tells me that they currently not have availability until December.  Problem List Items Addressed This Visit     Essential hypertension - Primary    Well-controlled on current medications with carvedilol 6.25 mg twice daily and isosorbide mononitrate 30 mg once daily.  Blood pressure reading today 150/80 she says it is unusual. Typically blood pressures at home and her PCP are well-regulated. Advised her to continue monitoring if blood pressures consistently above 130 over 80 mmHg, we can titrate up the dose of carvedilol to 12.5 mg twice daily.        Relevant Orders   EKG 12-Lead (Completed)   Mild aortic stenosis    Associated with mild to moderate aortic insufficiency on prior echocardiogram.  Recommend further follow-up with repeat echocardiogram in 6 months follow-up in the office subsequently for her regular visit.      Relevant Orders   ECHOCARDIOGRAM COMPLETE   Chest  discomfort    Atypical appears noncardiac. Extensive workup recently for similar symptoms with echocardiogram, CT coronary angiogram, Lexiscan stress test were unremarkable.  Her symptoms are more suggestive of gastroesophageal reflux and peptic ulcer disease.  Proceed with further GI evaluation      Left ventricular hypertrophy    Observed on echocardiography and consistent with the EKG findings.  Feels somewhat out of proportion to her history of mild aortic stenosis and hypertension.  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 She has not had any dangerous signs such as syncope or near syncope.  She denies any palpitations. She does not have any significant family history is suggestive of sudden cardiac death.  In the situation we will hold off on further testing for hypertrophic cardiomyopathy as management is unlikely to change.  I advised her to keep herself well-hydrated. If the blood pressure is suboptimally controlled, the recommendation would be to titrate up the dose of beta-blocker and avoid medications such as diuretics.  If she does have symptoms of palpitations or lightheadedness, we can consider heart monitor to rule out any cardiac arrhythmias.       Return to clinic in 6 months.   History of Present Illness:    KIMBELY POHLE is a 82 y.o. female who is being seen today for the evaluation of chest discomfort at the request of Lise Auer, MD.  Last visit at our office was with Dr. Tomie China December 04, 2022.  Prior to that she had a visit with Dr. Dulce Sellar in October 2023.  She  has mild nonobstructive coronary artery disease [by cardiac catheterization in April 2019 showing mild nonobstructive disease in ostial LAD; more recently by CT coronary angiogram April 2024], mild aortic stenosis, mild aortic insufficiency, hypertension, hyperlipidemia.  Features of left ventricular hypertrophy [observed on recent echocardiogram February 2024  and prior left ventriculogram assessment from cardiac cath in 2019] raising possibility of  hypertrophic cardiomyopathy.  I reviewed the echocardiogram images from February 2024 myself and there appears to show moderate LV wall thickening, with the degree of asymmetric thickening of the interventricular septum with only, mild midcavity dynamic peak gradient of 7 mmHg noted, there was no LV outflow tract obstruction.  She denies any family history of sudden cardiac death at a young age [sister died in her 44s in a hospital setting from unknown cause].  Very pleasant 82 year old woman.  Lives with her son at home.  She is by herself most of the day and able to do her activities of daily living without any limitation.  She is able to walk without any symptoms.  She had evaluation for chest pain in the past which was felt to be atypical and costochondritis related.  Has had cardiac workup with echocardiograms, stress test, coronary angiogram by cardiac catheterization and CT coronary angiogram as reviewed.  She feels her symptoms are more related to her gastroesophageal reflux disease with significant reflux symptoms.  Over the weekend, she reported episodes of heartburn-like symptoms at night on Saturday, associated with discomfort all over the chest radiating into her throat and the neck.  She did take sublingual nitroglycerin as previously prescribed with some relief.  Into Sunday morning her symptoms of improved but she does tend to have epigastric discomfort associated with reflux.  She is pending evaluation with GI service for further evaluation.  She describes the symptoms that have been going on are similar to her symptoms in the past when she had cardiac workup.  She continues to be physically active and mentions no trouble going up a flight of stairs.  Denies any dizziness, lightheadedness, syncopal episodes.  Denies any pedal edema.  Denies any palpitations.  EKG in the clinic today shows sinus rhythm  with heart rate 62/min, normal PR interval 132 ms, QRS duration 94 ms with morphology consistent with left ventricular hypertrophy and associated repolarization changes and T wave inversions.  EKG tracings on Lexiscan stress test with nuclear imaging from 10-16-2022 was similar.  Low risk Lexiscan stress test with nuclear imaging from February 2024, no evidence of ischemia.  CT coronary angiogram 01-01-2023 noted a calcium score of 96.2, CAD RADS 2 study with nonobstructive disease, minimal stenosis in left main, mild stenosis in proximal LAD, mild stenosis in D2, minimal stenosis in large circumflex.  RCA was noted to be small nondominant and normal.  Last echocardiogram for comparison is from 10-16-2022 with LVEF 65 to 70%, concentric left ventricular hypertrophy reported with grade 2 diastolic dysfunction, normal RV function, degenerative mitral valve without significant dysfunction, mild aortic valve calcification with mild regurgitation and mild stenosis.  Last lipid panel from July 2024 total cholesterol 169, HDL 55, LDL 98, triglycerides 88 Transaminases and alkaline phosphatase normal CBC from Jan 28, 2023 hemoglobin 15, hematocrit 47.1, Basic metabolic panel with BUN 8, creatinine 0.7 sodium 139 and potassium 3.9 was unremarkable on Jan 28, 2023.    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   Coronary artery disease involving native coronary artery of native heart without angina pectoris 10/10/2015   Depression    Dyslipidemia 10/10/2015   Dyspnea 11/21/2019   Essential hypertension 08/28/2016   Eustachian tube dysfunction, right 11/28/2022   GERD (gastroesophageal reflux disease)    Glaucoma    Headache above the eye region 11/10/2017    Heart murmur    History of right mastoidectomy 06/29/2020   Hyperlipemia 11/28/2022   Hyperlipidemia    Hypertension    Imbalance 04/17/2022   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    EYE SURGERY     LEFT HEART CATH AND CORONARY ANGIOGRAPHY N/A 12/28/2017   Procedure: LEFT HEART CATH AND CORONARY ANGIOGRAPHY;  Surgeon: Lyn Records, MD;  Location: [facility];  Service: Cardiovascular;  Laterality: N/A;   MASTOIDECTOMY     PATENT DUCTUS ARTERIOUS REPAIR     SHOULDER OPEN ROTATOR CUFF REPAIR Bilateral    UPPER GASTROINTESTINAL ENDOSCOPY  10/15/2021    Current Medications: Current Meds  Medication Sig   albuterol (VENTOLIN HFA) 108 (90 Base) MCG/ACT inhaler Inhale 2 puffs into the lungs every 6 (six) hours as needed for wheezing or shortness of breath.   ALPRAZolam (XANAX) 0.25 MG tablet Take 0.25 mg by mouth 2 (two) times daily as needed for anxiety.   aspirin EC 81 MG tablet Take 81 mg by mouth daily.   carvedilol (COREG) 6.25 MG tablet Take 6.25 mg by mouth 2 (two) times daily with a meal.   Coenzyme Q10 (CO Q-10) 200 MG CAPS Take 1 capsule by mouth daily.   dorzolamide (TRUSOPT) 2 % ophthalmic solution Place 1 drop into both eyes 2 (two) times daily.   FLOVENT HFA 110 MCG/ACT inhaler Inhale 1 puff into the lungs 2 (two) times daily.   fluticasone (FLONASE) 50 MCG/ACT nasal spray Place 2 sprays into both nostrils daily.   furosemide (LASIX) 40 MG tablet Take 20 mg by mouth daily.    hydrALAZINE (APRESOLINE) 25 MG tablet Take 12.5 mg by mouth as needed (high blood pressure).   isosorbide mononitrate (IMDUR) 30 MG 24 hr tablet Take 1 tablet (30 mg total) by mouth daily.   latanoprost (XALATAN) 0.005 % ophthalmic solution Place 1 drop into both eyes at bedtime.   linaclotide (LINZESS) 72 MCG capsule Take 72 mcg by mouth daily.   Multiple Vitamin (MULTIVITAMIN) tablet Take 1 tablet by mouth daily.   nitroGLYCERIN (NITROSTAT) 0.4 MG SL tablet DISSOLVE 1 TABLET UNDER THE TONGUE EVERY 5 MINUTES AS NEEDED FOR CHEST PAIN. DO NOT EXCEED A TOTAL OF 3 DOSES IN 15 MINUTES. (Patient taking differently: Place 0.4 mg under the tongue every 5 (five) minutes as needed for chest pain.)   omeprazole (PRILOSEC) 40 MG capsule Take 40 mg by mouth at bedtime.   potassium chloride (MICRO-K) 10 MEQ CR capsule Take 10 mEq  by mouth every morning.   predniSONE (DELTASONE) 10 MG tablet Take 10 mg by mouth as directed.   rosuvastatin (CRESTOR) 20 MG tablet Take 40 mg (1 tablet) three times a week. (Patient taking differently: Take 40 mg by mouth 3 (three) times daily. Take 40 mg (1 tablet) three times a week.)   TIMOLOL MALEATE OP Apply 1 drop to eye 2 (two) times daily.   traMADol-acetaminophen (ULTRACET) 37.5-325 MG tablet Take 1-2 tablets by mouth 2 (two) times daily as needed for pain.   [DISCONTINUED] pantoprazole (PROTONIX) 40 MG tablet Take 1 tablet (40 mg total) by mouth daily.   [DISCONTINUED] sucralfate (CARAFATE) 1 g tablet Take 1 g by mouth daily.     Allergies:   Antihistamines, chlorpheniramine-type; Ativan [lorazepam]; Ceftin [cefuroxime axetil]; Meloxicam; Pheneen [benzalkonium chloride]; and Morphine   Social History   Socioeconomic History   Marital status: Divorced    Spouse name: Not on file   Number of children: 5   Years of education: Not on file   Highest education level: Not on file  Occupational History   Occupation: Retired   Tobacco Use   Smoking status: Never    Smokeless tobacco: Never  Vaping Use   Vaping status: Never Used  Substance and Sexual Activity   Alcohol use: No   Drug use: Never   Sexual activity: Not Currently    Birth control/protection: Post-menopausal  Other Topics Concern   Not on file  Social History Narrative   Not on file   Social Determinants of Health   Financial Resource Strain: Low Risk  (01/29/2023)   Received from [facility] and [facility], [facility] and [facility]   Overall Financial Resource Strain (CARDIA)    Difficulty of Paying Living Expenses: Not hard at all  Food Insecurity: No Food Insecurity (01/28/2023)   Received from [facility] and [facility], [facility] and [facility]   Hunger Vital Sign    Worried About Running Out of Food in the Last Year: Never true    Ran Out of Food in the Last Year: Never true  Transportation Needs: No Transportation Needs (01/29/2023)   Received from [facility] and [facility], [facility] and [facility]   [employer] - Administrator, Civil Service (Medical): No    Lack of Transportation (Non-Medical): No  Physical Activity: Not on file  Stress: Not on file  Social Connections: Not on file     Family History: The patient's family history includes Colon cancer in her brother; Diabetes in her brother and daughter; Heart attack in her mother and sister; Stomach cancer in her sister. There is no history of Esophageal cancer. ROS:   Please see the history of present illness.    All 14 point review of systems negative except as described per history of present illness.  EKGs/Labs/Other Studies Reviewed:    The following studies were reviewed today:   EKG:  EKG Interpretation Date/Time:  Tuesday June 02 2023 10:44:14 EDT Ventricular Rate:  62 PR Interval:  132 QRS Duration:  94 QT Interval:  382 QTC Calculation: 387 R Axis:   60  Text Interpretation: Normal sinus rhythm Right  atrial enlargement Left ventricular hypertrophy with repolarization abnormality Abnormal ECG No previous ECGs available Confirmed by Huntley Dec reddy [phone]) on 06/02/2023 10:53:13 AM    Recent Labs: 09/24/2022: NT-Pro BNP 235 01/12/2023: BUN 13; Creatinine, Ser 0.65; Potassium 3.9; Sodium 141 03/17/2023: ALT 12  Recent Lipid Panel    Component Value Date/Time   CHOL 169 03/17/2023 1028   TRIG 88 03/17/2023 1028   HDL 55 03/17/2023 1028   CHOLHDL 3.1 03/17/2023 1028   LDLCALC 98 03/17/2023 1028    25-30% ostial LAD. Luminal irregularities throughout the mid circumflex less than 20%. Normal left main Nondominant right coronary without obstruction Normal systolic function with EF greater than 60%.  Near complete cavity obliteration during systole raising question of hypertrophic cardiomyopathy.   RECOMMENDATIONS:   Because of plaque in the ostial LAD would recommend aggressive lipid-lowering. Consider 2D Doppler echocardiogram to assess wall thickness and rule out hypertrophic cardiomyopathy.  Physical Exam:    VS:  BP (!) 150/80 (BP Location: Left Arm, Patient Position: Sitting)   Pulse 62   Ht 5\' 3"  (1.6 m)   Wt 154 lb 9.6 oz (70.1 kg)   SpO2 95%   BMI 27.39 kg/m     Wt Readings from Last 3 Encounters:  06/02/23 154 lb 9.6 oz (70.1 kg)  12/04/22 153 lb (69.4 kg)  10/16/22 154 lb (69.9 kg)     GENERAL:  Well nourished, well developed in no acute distress NECK: No JVD; No carotid bruits CARDIAC: RRR, S1 and S2 present, 3/6 ejection systolic murmur best heard left parasternally and right third intercostal space. CHEST:  Clear to auscultation without rales, wheezing or rhonchi  Extremities: No pitting pedal edema. Pulses bilaterally symmetric with radial 2+ and dorsalis pedis 2+ NEUROLOGIC:  Alert and oriented x 3  Medication Adjustments/Labs and Tests Ordered: Current medicines are reviewed at length with the patient today.  Concerns regarding medicines are outlined  above.  Orders Placed This Encounter  Procedures   EKG 12-Lead   ECHOCARDIOGRAM COMPLETE   No orders of the defined types were placed in this encounter.   Signed, Cecille Amsterdam, MD, MPH, [facility]. 06/02/2023 12:30 PM    [facility]

## 2023-06-02 NOTE — Assessment & Plan Note (Signed)
Associated with mild to moderate aortic insufficiency on prior echocardiogram.  Recommend further follow-up with repeat echocardiogram in 6 months follow-up in the office subsequently for her regular visit.

## 2023-06-02 NOTE — Assessment & Plan Note (Signed)
Well-controlled on current medications with carvedilol 6.25 mg twice daily and isosorbide mononitrate 30 mg once daily.  Blood pressure reading today 150/80 she says it is unusual. Typically blood pressures at home and her PCP are well-regulated. Advised her to continue monitoring if blood pressures consistently above 130 over 80 mmHg, we can titrate up the dose of carvedilol to 12.5 mg twice daily.

## 2023-06-02 NOTE — Assessment & Plan Note (Signed)
Atypical appears noncardiac. Extensive workup recently for similar symptoms with echocardiogram, CT coronary angiogram, Lexiscan stress test were unremarkable.  Her symptoms are more suggestive of gastroesophageal reflux and peptic ulcer disease.  Proceed with further GI evaluation

## 2023-06-02 NOTE — Patient Instructions (Addendum)
Medication Instructions:  Your physician recommends that you continue on your current medications as directed. Please refer to the Current Medication list given to you today.  *If you need a refill on your cardiac medications before your next appointment, please call your pharmacy*   Lab Work: None Ordered If you have labs (blood work) drawn today and your tests are completely normal, you will receive your results only by: MyChart Message (if you have MyChart) OR A paper copy in the mail If you have any lab test that is abnormal or we need to change your treatment, we will call you to review the results.   Testing/Procedures: Your physician has requested that you have an echocardiogram. Echocardiography is a painless test that uses sound waves to create images of your heart. It provides your doctor with information about the size and shape of your heart and how well your heart's chambers and valves are working. This procedure takes approximately one hour. There are no restrictions for this procedure. Please do NOT wear cologne, perfume, aftershave, or lotions (deodorant is allowed). Please arrive 15 minutes prior to your appointment time.    Follow-Up: At [facility], you and your health needs are our priority.  As part of our continuing mission to provide you with exceptional heart care, we have created designated Provider Care Teams.  These Care Teams include your primary Cardiologist (physician) and Advanced Practice Providers (APPs -  Physician Assistants and Nurse Practitioners) who all work together to provide you with the care you need, when you need it.  We recommend signing up for the patient portal called "MyChart".  Sign up information is provided on this After Visit Summary.  MyChart is used to connect with patients for Virtual Visits (Telemedicine).  Patients are able to view lab/test results, encounter notes, upcoming appointments, etc.  Non-urgent messages can be sent to your  provider as well.   To learn more about what you can do with MyChart, go to [email or web address].    Your next appointment:   6 month(s)  The format for your next appointment:   In Person  Provider:   Huntley Dec, MD    Other Instructions Follow up with Dr. Park Breed to get GI referral

## 2023-06-18 DIAGNOSIS — H6501 Acute serous otitis media, right ear: Secondary | ICD-10-CM

## 2023-06-18 HISTORY — DX: Acute serous otitis media, right ear: H65.01

## 2023-06-30 DIAGNOSIS — K219 Gastro-esophageal reflux disease without esophagitis: Secondary | ICD-10-CM | POA: Diagnosis not present

## 2023-06-30 DIAGNOSIS — Z6828 Body mass index (BMI) 28.0-28.9, adult: Secondary | ICD-10-CM | POA: Diagnosis not present

## 2023-06-30 DIAGNOSIS — R0789 Other chest pain: Secondary | ICD-10-CM | POA: Diagnosis not present

## 2023-06-30 DIAGNOSIS — Z23 Encounter for immunization: Secondary | ICD-10-CM | POA: Diagnosis not present

## 2023-07-09 DIAGNOSIS — H6991 Unspecified Eustachian tube disorder, right ear: Secondary | ICD-10-CM | POA: Diagnosis not present

## 2023-07-09 DIAGNOSIS — H9201 Otalgia, right ear: Secondary | ICD-10-CM | POA: Diagnosis not present

## 2023-07-09 DIAGNOSIS — Z9089 Acquired absence of other organs: Secondary | ICD-10-CM | POA: Diagnosis not present

## 2023-07-09 DIAGNOSIS — R2689 Other abnormalities of gait and mobility: Secondary | ICD-10-CM | POA: Diagnosis not present

## 2023-07-09 DIAGNOSIS — Z9889 Other specified postprocedural states: Secondary | ICD-10-CM | POA: Diagnosis not present

## 2023-07-09 DIAGNOSIS — G44329 Chronic post-traumatic headache, not intractable: Secondary | ICD-10-CM | POA: Diagnosis not present

## 2023-07-09 DIAGNOSIS — H90A22 Sensorineural hearing loss, unilateral, left ear, with restricted hearing on the contralateral side: Secondary | ICD-10-CM | POA: Diagnosis not present

## 2023-07-09 DIAGNOSIS — H90A31 Mixed conductive and sensorineural hearing loss, unilateral, right ear with restricted hearing on the contralateral side: Secondary | ICD-10-CM | POA: Diagnosis not present

## 2023-07-13 ENCOUNTER — Ambulatory Visit: Payer: PPO

## 2023-07-13 ENCOUNTER — Telehealth: Payer: Self-pay | Admitting: Cardiology

## 2023-07-13 VITALS — BP 160/82 | HR 65 | Ht 63.6 in | Wt 155.4 lb

## 2023-07-13 DIAGNOSIS — R071 Chest pain on breathing: Secondary | ICD-10-CM

## 2023-07-13 DIAGNOSIS — I10 Essential (primary) hypertension: Secondary | ICD-10-CM | POA: Diagnosis not present

## 2023-07-13 NOTE — Patient Instructions (Signed)

## 2023-07-13 NOTE — Assessment & Plan Note (Signed)
Appears noncardiac in description. From cardiac standpoint she is known to have mild nonobstructive coronary artery disease on prior workup recently with CT coronary angiogram April 2024, EKG findings are unchanged with similar LVH morphology and repolarization changes.  Low suspicion for this to be an acute coronary event or related to angina.  Sounds more musculoskeletal in nature. Encouraged adequate hydration and analgesia use as needed.

## 2023-07-13 NOTE — Progress Notes (Signed)
Cardiology Consultation:    Date:  07/13/2023   ID:  Sheila [name], DOB 08-Aug-1941, MRN [id number]  PCP:  Lise Auer, MD  Cardiologist:  Marlyn Corporal Charina Fons, MD   Referring MD: Lise Auer, MD   No chief complaint on file.    ASSESSMENT AND PLAN:   Ms. 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 atypical symptoms of chest discomfort suggestive of heartburn.  Problem List Items Addressed This Visit     Chest pain - Primary    Appears noncardiac in description. From cardiac standpoint she is known to have mild nonobstructive coronary artery disease on prior workup recently with CT coronary angiogram April 2024, EKG findings are unchanged with similar LVH morphology and repolarization changes.  Low suspicion for this to be an acute coronary event or related to angina.  Sounds more musculoskeletal in nature. Encouraged adequate hydration and analgesia use as needed.       Relevant Orders   EKG 12-Lead (Completed)   Hypertension    Uncontrolled at this time at the office visit. Mentions blood pressure readings at home and crosscheck with PCPs office readings were well-controlled. Continue carvedilol 6.25 mg twice daily She uses hydralazine on an as-needed basis. Continues to use isosorbide mononitrate 30 mg once a day.  Continue to follow-up blood pressure readings at home and review with either PCPs office or with [place] if consistently  above 140 over 90 mmHg.       Relevant Medications   rosuvastatin (CRESTOR) 20 MG tablet    Return to clinic in 6 months or as needed.  History of Present Illness:    Sheila [name] is a 82 y.o. female who is being seen today for follow up visit, her last visit with me in the office was 06/02/2023. Her PCP is  Lise Auer, MD.   History of mild nonobstructive coronary artery disease on prior cardiac catheterization in 2019, subsequent stress test in 2024 February without ischemia, with persistent chest pain CT coronary angiogram repeated April 2024 with mild nonobstructive coronary artery disease, mild aortic stenosis, mild aortic insufficiency, hypertension, hyperlipidemia, moderate to severe LVH on prior echocardiogram from February 2024 with mild midcavity gradient of 7 mmHg at rest and EKG findings with LVH and repolarization changes raises suspicion for hypertrophic cardiomyopathy without significant LV outflow tract obstruction.  Was recommended to follow-up in 6 months  Continues to have atypical chest pain symptoms, which for mostly gastrointestinal in description and she was pending follow-up with GI service.  Tells me that this appointment is scheduled for Thursday this week.  Now here for an earlier follow-up appointment-she was having ongoing symptoms of chest pain that she describes as left precordial, sharp in intensity, worse on Sunday lasting all day worse with movement of left upper extremity and with deep inspiration.  Symptoms have slightly eased up but she continues to feel sensation of chest discomfort.  She has used 1 dose of sublingual nitroglycerin in the past 3 days to help with the symptoms with minimum  success.  This appears noncardiac in description  EKG in the clinic today shows sinus rhythm heart rate 65/min, LVH morphology QRS 90 ms duration with repolarization abnormality in inferolateral leads.  Similar in comparison to prior EKG from 06-02-2023.   Overall she  feels well.  Mentions blood pressures at home have been well-controlled, elevated reading today at the office visit is unusual.   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HCC) 05/18/2020   Coronary artery disease involving native coronary artery of native heart without angina pectoris 10/10/2015   Depression    Dyslipidemia 10/10/2015   Dyspnea 11/21/2019   Essential hypertension 08/28/2016   Eustachian tube dysfunction, right 11/28/2022   GERD (gastroesophageal reflux disease)    Glaucoma    Headache above the eye region 11/10/2017   Heart murmur    History of right mastoidectomy 06/29/2020   Hyperlipemia 11/28/2022   Hyperlipidemia    Hypertension    Imbalance 04/17/2022   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    EYE SURGERY     LEFT HEART CATH AND CORONARY ANGIOGRAPHY N/A 12/28/2017   Procedure: LEFT HEART CATH AND CORONARY ANGIOGRAPHY;  Surgeon: Lyn Records, MD;  Location: [facility];  Service: Cardiovascular;  Laterality: N/A;   MASTOIDECTOMY     PATENT DUCTUS ARTERIOUS REPAIR     SHOULDER OPEN ROTATOR CUFF REPAIR Bilateral    UPPER GASTROINTESTINAL ENDOSCOPY  10/15/2021    Current Medications: Current Meds  Medication Sig   albuterol  (VENTOLIN HFA) 108 (90 Base) MCG/ACT inhaler Inhale 2 puffs into the lungs every 6 (six) hours as needed for wheezing or shortness of breath.   ALPRAZolam (XANAX) 0.25 MG tablet Take 0.25 mg by mouth 2 (two) times daily as needed for anxiety.   aspirin EC 81 MG tablet Take 81 mg by mouth daily.   carvedilol (COREG) 6.25 MG tablet Take 6.25 mg by mouth 2 (two) times daily with a meal.   celecoxib (CELEBREX) 100 MG capsule Take 1 capsule (100 mg total) by mouth 2 (two) times daily.   Coenzyme Q10 (CO Q-10) 200 MG CAPS Take 1 capsule by mouth daily.   dorzolamide (TRUSOPT) 2 % ophthalmic solution Place 1 drop into both eyes 2 (two) times daily.   FLOVENT HFA 110 MCG/ACT inhaler Inhale 1 puff into the lungs 2 (two) times daily.   fluticasone (FLONASE) 50 MCG/ACT nasal spray Place 2 sprays into both nostrils daily.   furosemide (LASIX) 40 MG tablet Take 20 mg by mouth daily.   hydrALAZINE (APRESOLINE) 25 MG tablet Take 12.5 mg by mouth as needed (high blood pressure).   isosorbide mononitrate (IMDUR) 30 MG 24 hr tablet Take 1 tablet (30 mg total) by mouth  daily.   latanoprost (XALATAN) 0.005 % ophthalmic solution Place 1 drop into both eyes at bedtime.   linaclotide (LINZESS) 72 MCG capsule Take 72 mcg by mouth daily.   Multiple Vitamin (MULTIVITAMIN) tablet Take 1 tablet by mouth daily.   nitroGLYCERIN (NITROSTAT) 0.4 MG SL tablet DISSOLVE 1 TABLET UNDER THE TONGUE EVERY 5 MINUTES AS NEEDED FOR CHEST PAIN. DO NOT EXCEED A TOTAL OF 3 DOSES IN 15 MINUTES.   omeprazole (PRILOSEC) 40 MG capsule Take 40 mg by mouth at bedtime.   potassium chloride (MICRO-K) 10 MEQ CR capsule Take 10 mEq by mouth every morning.   rosuvastatin (CRESTOR) 20 MG tablet Take 20 mg by mouth 3 (three) times a week.   TIMOLOL MALEATE OP Apply 1 drop to eye 2 (two) times daily.   traMADol-acetaminophen (ULTRACET) 37.5-325 MG tablet Take 1-2 tablets by mouth 2 (two) times daily as needed for pain.     Allergies:   Antihistamines,  chlorpheniramine-type; Ativan [lorazepam]; Ceftin [cefuroxime axetil]; Meloxicam; Pheneen [benzalkonium chloride]; and Morphine   Social History   Socioeconomic History   Marital status: Divorced    Spouse name: Not on file   Number of children: 5   Years of education: Not on file   Highest education level: Not on file  Occupational History   Occupation: Retired   Tobacco Use   Smoking status: Never   Smokeless tobacco: Never  Vaping Use   Vaping status: Never Used  Substance and Sexual Activity   Alcohol use: No   Drug use: Never   Sexual activity: Not Currently    Birth control/protection: Post-menopausal  Other Topics Concern   Not on file  Social History Narrative   Not on file   Social Determinants of Health   Financial Resource Strain: Low Risk  (01/29/2023)   Received from [facility] [employer], [facility] and [facility]   Overall Financial Resource Strain (CARDIA)    Difficulty of Paying Living Expenses: Not hard at all  Food Insecurity: Low Risk  (07/09/2023)   Received from [facility]   Hunger Vital Sign    Worried About Running Out of Food in the Last Year: Never true    Ran Out of Food in the Last Year: Never true  Transportation Needs: No Transportation Needs (07/09/2023)   Received from [employer]    In the past 12 months, has lack of reliable transportation kept you from medical appointments, meetings, work or from getting things needed for daily living? : No  Physical Activity: Not on file  Stress: Not on file  Social Connections: Not on file     Family History: The patient's family history includes Colon cancer in her brother; Diabetes in her brother and daughter; Heart attack in her mother and sister; Stomach cancer in her sister. There is no history of Esophageal cancer. ROS:   Please see the history of present illness.    All 14 point review of systems negative except as described per history of  present illness.  EKGs/Labs/Other Studies Reviewed:    The following studies were reviewed today:   EKG:       Recent Labs: 09/24/2022: NT-Pro BNP 235 01/12/2023: BUN 13; Creatinine, Ser 0.65; Potassium 3.9; Sodium 141 03/17/2023: ALT 12  Recent Lipid Panel    Component Value Date/Time   CHOL 169 03/17/2023 1028   TRIG 88 03/17/2023 1028   HDL 55 03/17/2023 1028   CHOLHDL 3.1 03/17/2023 1028  LDLCALC 98 03/17/2023 1028    Physical Exam:    VS:  BP (!) 160/82   Pulse 65   Ht 5' 3.6" (1.615 m)   Wt 155 lb 6.4 oz (70.5 kg)   SpO2 99%   BMI 27.01 kg/m     Wt Readings from Last 3 Encounters:  07/13/23 155 lb 6.4 oz (70.5 kg)  06/02/23 154 lb 9.6 oz (70.1 kg)  12/04/22 153 lb (69.4 kg)     GENERAL:  Well nourished, well developed in no acute distress NECK: No JVD; No carotid bruits CARDIAC: RRR, S1 and S2 present, 3/6 ejection systolic murmur best heard left parasternally.  CHEST:  Clear to auscultation without rales, wheezing or rhonchi  Extremities: No pitting pedal edema. NEUROLOGIC:  Alert and oriented x 3  Medication Adjustments/Labs and Tests Ordered: Current medicines are reviewed at length with the patient today.  Concerns regarding medicines are outlined above.  Orders Placed This Encounter  Procedures   EKG 12-Lead   No orders of the defined types were placed in this encounter.   Signed, Saige Busby reddy Marletta Bousquet, MD, MPH, [facility]. 07/13/2023 3:06 PM    [facility]

## 2023-07-13 NOTE — Assessment & Plan Note (Signed)
Uncontrolled at this time at the office visit. Mentions blood pressure readings at home and crosscheck with PCPs office readings were well-controlled. Continue carvedilol 6.25 mg twice daily She uses hydralazine on an as-needed basis. Continues to use isosorbide mononitrate 30 mg once a day.  Continue to follow-up blood pressure readings at home and review with either PCPs office or with [place] if consistently above 140 over 90 mmHg.

## 2023-07-13 NOTE — Telephone Encounter (Signed)
Error

## 2023-07-15 DIAGNOSIS — H402232 Chronic angle-closure glaucoma, bilateral, moderate stage: Secondary | ICD-10-CM | POA: Diagnosis not present

## 2023-07-16 DIAGNOSIS — R141 Gas pain: Secondary | ICD-10-CM | POA: Diagnosis not present

## 2023-07-16 DIAGNOSIS — R140 Abdominal distension (gaseous): Secondary | ICD-10-CM | POA: Diagnosis not present

## 2023-07-16 DIAGNOSIS — K5909 Other constipation: Secondary | ICD-10-CM | POA: Diagnosis not present

## 2023-07-16 DIAGNOSIS — K219 Gastro-esophageal reflux disease without esophagitis: Secondary | ICD-10-CM | POA: Diagnosis not present

## 2023-08-10 DIAGNOSIS — H579 Unspecified disorder of eye and adnexa: Secondary | ICD-10-CM | POA: Diagnosis not present

## 2023-08-11 DIAGNOSIS — R0789 Other chest pain: Secondary | ICD-10-CM | POA: Diagnosis not present

## 2023-08-11 DIAGNOSIS — K219 Gastro-esophageal reflux disease without esophagitis: Secondary | ICD-10-CM | POA: Diagnosis not present

## 2023-08-20 ENCOUNTER — Other Ambulatory Visit (INDEPENDENT_AMBULATORY_CARE_PROVIDER_SITE_OTHER): Payer: PPO

## 2023-08-20 ENCOUNTER — Encounter: Payer: Self-pay | Admitting: Gastroenterology

## 2023-08-20 ENCOUNTER — Ambulatory Visit: Payer: PPO | Admitting: Gastroenterology

## 2023-08-20 ENCOUNTER — Ambulatory Visit (INDEPENDENT_AMBULATORY_CARE_PROVIDER_SITE_OTHER)
Admission: RE | Admit: 2023-08-20 | Discharge: 2023-08-20 | Disposition: A | Discharge status: Home / Self Care | Payer: PPO | Source: Ambulatory Visit | Attending: Gastroenterology | Admitting: Gastroenterology

## 2023-08-20 VITALS — BP 144/60 | HR 72 | Ht 63.0 in | Wt 157.2 lb

## 2023-08-20 DIAGNOSIS — J449 Chronic obstructive pulmonary disease, unspecified: Secondary | ICD-10-CM

## 2023-08-20 DIAGNOSIS — R0789 Other chest pain: Secondary | ICD-10-CM | POA: Diagnosis not present

## 2023-08-20 DIAGNOSIS — R1319 Other dysphagia: Secondary | ICD-10-CM

## 2023-08-20 DIAGNOSIS — K5900 Constipation, unspecified: Secondary | ICD-10-CM | POA: Diagnosis not present

## 2023-08-20 DIAGNOSIS — J45909 Unspecified asthma, uncomplicated: Secondary | ICD-10-CM | POA: Diagnosis not present

## 2023-08-20 DIAGNOSIS — K219 Gastro-esophageal reflux disease without esophagitis: Secondary | ICD-10-CM

## 2023-08-20 DIAGNOSIS — R1013 Epigastric pain: Secondary | ICD-10-CM | POA: Diagnosis not present

## 2023-08-20 DIAGNOSIS — R1310 Dysphagia, unspecified: Secondary | ICD-10-CM

## 2023-08-20 LAB — COMPREHENSIVE METABOLIC PANEL
ALT: 8 U/L (ref 0–35)
AST: 11 U/L (ref 0–37)
Albumin: 4.1 g/dL (ref 3.5–5.2)
Alkaline Phosphatase: 59 U/L (ref 39–117)
BUN: 11 mg/dL (ref 6–23)
CO2: 31 meq/L (ref 19–32)
Calcium: 9.5 mg/dL (ref 8.4–10.5)
Chloride: 101 meq/L (ref 96–112)
Creatinine, Ser: 0.58 mg/dL (ref 0.40–1.20)
GFR: 84.61 mL/min (ref >60.00)
Glucose, Bld: 56 mg/dL — ABNORMAL LOW (ref 70–99)
Potassium: 3.8 meq/L (ref 3.5–5.1)
Sodium: 140 meq/L (ref 135–145)
Total Bilirubin: 0.4 mg/dL (ref 0.2–1.2)
Total Protein: 7 g/dL (ref 6.0–8.3)

## 2023-08-20 LAB — CBC WITH DIFFERENTIAL/PLATELET
Basophils Absolute: 0 10*3/uL (ref 0.0–0.1)
Basophils Relative: 0.6 % (ref 0.0–3.0)
Eosinophils Absolute: 0.1 10*3/uL (ref 0.0–0.7)
Eosinophils Relative: 2.2 % (ref 0.0–5.0)
HCT: 41.1 % (ref 36.0–46.0)
Hemoglobin: 13.3 g/dL (ref 12.0–15.0)
Lymphocytes Relative: 27.7 % (ref 12.0–46.0)
Lymphs Abs: 1.5 10*3/uL (ref 0.7–4.0)
MCHC: 32.3 g/dL (ref 30.0–36.0)
MCV: 83.1 fL (ref 78.0–100.0)
Monocytes Absolute: 0.9 10*3/uL (ref 0.1–1.0)
Monocytes Relative: 16.1 % — ABNORMAL HIGH (ref 3.0–12.0)
Neutro Abs: 2.9 10*3/uL (ref 1.4–7.7)
Neutrophils Relative %: 53.4 % (ref 43.0–77.0)
Platelets: 190 10*3/uL (ref 150.0–400.0)
RBC: 4.95 Mil/uL (ref 3.87–5.11)
RDW: 15.5 % (ref 11.5–15.5)
WBC: 5.5 10*3/uL (ref 4.0–10.5)

## 2023-08-20 LAB — LIPASE: Lipase: 9 U/L — ABNORMAL LOW (ref 11.0–59.0)

## 2023-08-20 NOTE — Patient Instructions (Addendum)
_______________________________________________________  If your blood pressure at your visit was 140/90 or greater, please contact your primary care physician to follow up on this.  _______________________________________________________  If you are age 81 or older, your body mass index should be between 23-30. Your Body mass index is 27.86 kg/m. If this is out of the aforementioned range listed, please consider follow up with your Primary Care Provider.  If you are age 5 or younger, your body mass index should be between 19-25. Your Body mass index is 27.86 kg/m. If this is out of the aformentioned range listed, please consider follow up with your Primary Care Provider.   ________________________________________________________  The [facility] providers would like to encourage you to use [facility] to communicate with providers for non-urgent requests or questions.  Due to long hold times on the telephone, sending your provider a message by [facility] may be a faster and more efficient way to get a response.  Please allow 48 business hours for a response.  Please remember that this is for non-urgent requests.  _______________________________________________________  Your provider has requested that you have an chest x ray before leaving today. Please go to the basement floor to our Radiology department for the test.  Your provider has requested that you go to the basement level for lab work before leaving today. Press "B" on the elevator. The lab is located at the first door on the left as you exit the elevator.  Can use Biofreeze as needed and heating pads   We have given you samples of the following medication to take: Linzess Voquezna  You have been scheduled for an endoscopy. Please follow written instructions given to you at your visit today.  If you use inhalers (even only as needed), please bring them with you on the day of your procedure.  If you take any of the following  medications, they will need to be adjusted prior to your procedure:   DO NOT TAKE 7 DAYS PRIOR TO TEST- Trulicity (dulaglutide) Ozempic, Wegovy (semaglutide) Mounjaro (tirzepatide) Bydureon Bcise (exanatide extended release)  DO NOT TAKE 1 DAY PRIOR TO YOUR TEST Rybelsus (semaglutide) Adlyxin (lixisenatide) Victoza (liraglutide) Byetta (exanatide) ___________________________________________________________________________  Thank you,  Dr. Lynann Bologna

## 2023-08-20 NOTE — Progress Notes (Signed)
Chief Complaint: FU  Referring Provider:  Lise Auer, MD      ASSESSMENT AND PLAN;   #1. GERD with eso dysphagia d/t Schatzki's ring s/p dil 50Fr 11/2018 with complete resolution. #2. Epi pain/NCCP-also with costochondritis and cervical/thoracic spondylarthritis. Neg CTA chest/Abdo/pelvis 05/2021, neg cardiac Stress test 05/2021, neg [place] 10/2019. Nl CBC, CMP #3. Constipation -likely exacerbated by medications like amlodipine (better). #4. FH colon cancer (brother with stage IV colon cancer at age 86). Neg colon 11/2018 except for mod sig div. No need to repeat unless new problems. #5. Asthma/COPD  Plan:  - Continue Voquenza 20mg  po QD (samples) - EGD with dil - Biofreeze PRN/heating pads for costochondritis/musculoskeletal component. - Linzess 72 mcg po every day - CBC, CMP, lipase - Cx PA and lat   Proceed with EGD with dil. I have discussed the risks and benefits. The risks including rare risk of perforation, bleeding, missed UGI neoplasms, risks of anesthesia/sedation. Alternatives were given. Patient is aware and agrees to proceed. All the questions were answered. This will be scheduled in upcoming days. Consent forms were given for review.  HPI:    Sheila [name] is a 82 y.o. female   FU Has been having noncardiac chest pains associated with solid food dysphagia. Chest pains may or may not be related to food intake.  She is compliant with Protonix.  Previous EGD with dilation did help with dysphagia and chest pains.  Occ Epi pain, especially after eating, associated occasional heartburn.  Mild constipation, better with linzess  Neg cardiology [facility]-cardiology note reviewed   From previous notes- ED to [facility] 05/2021 For atypical chest pains. Neg CTA chest/abdo/pelvis, neg cardiac stress test Dx with costochronditis.  Again came to ED 08/06/2021 with atypical chest pains.  Has seen Dr. Arty Baumgartner cardiac.  GI work-up advised.  No melena or hematochezia.  Did  complain of some shortness of breath-has been taking inhalers.  No wheezing on today's exam.   From previous notes  Adm to [facility] (adm 2/4-10/14/2019) d/t chest pain/epigastric pain. Negative Lexiscan stress test (EF 64%), neg RUQ [place]. Nl CBC, CMP and lipase.  2020- ED at [facility] with chest pains-found to have mild pneumonia, treated with Levaquin.  Subsequent CT chest on 02/11/2019 showed complete resolution of previous pulmonary abnormalities but showed new scattered groundglass opacities measuring 7 mm in the right lung.  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   Past GI procedures: -Colonoscopy 11/2018 ([facility])-moderate sigmoid diverticulosis.  6 mm polyp status post polypectomy. Bx- neg. no need to repeat unless problems. -EGD 11/2018 Schatzki's ring status post esophageal dilatation 50 Fr, small hiatal hernia, few duodenal erosions.  Negative biopsies for HP. -CT Abdo/pelvis with contrast: 11/11/2018-neg except for DJD thoracolumbar spine, calcified uterine fibroids, sigmoid div without diverticulitis. - CT chest 02/11/2019: 3.6 cm ascending thoracic aortic aneurysm, 3.7 cm descending thoracic aortic aneurysm, coronary calcification.  Being followed by Dr. Park Breed and Dr. Tomie China. 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HCC) 05/18/2020   Coronary artery disease involving native coronary artery of native heart without angina pectoris 10/10/2015   Depression    Dyslipidemia 10/10/2015   Dyspnea  11/21/2019   Essential hypertension 08/28/2016   Eustachian tube dysfunction, right 11/28/2022   GERD (gastroesophageal reflux disease)    Glaucoma    Headache above the eye region 11/10/2017   Heart murmur     History of right mastoidectomy 06/29/2020   Hyperlipemia 11/28/2022   Hyperlipidemia    Hypertension    Imbalance 04/17/2022   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    EYE SURGERY     LEFT HEART CATH AND CORONARY ANGIOGRAPHY N/A 12/28/2017   Procedure: LEFT HEART CATH AND CORONARY ANGIOGRAPHY;  Surgeon: Lyn Records, MD;  Location: [facility];  Service: Cardiovascular;  Laterality: N/A;   MASTOIDECTOMY     PATENT DUCTUS ARTERIOUS REPAIR     SHOULDER OPEN ROTATOR CUFF REPAIR Bilateral    UPPER GASTROINTESTINAL ENDOSCOPY  10/15/2021    Family History  Problem Relation Age of Onset   Heart attack Mother    Heart attack Sister    Colon cancer Brother    Diabetes Brother    Diabetes Daughter    Stomach cancer Sister    Esophageal cancer Neg Hx     Social History   Tobacco Use   Smoking status: Never   Smokeless tobacco: Never  Vaping Use   Vaping status: Never Used  Substance Use Topics   Alcohol use: No   Drug use: Never    Current Outpatient Medications  Medication Sig Dispense Refill   albuterol (VENTOLIN HFA) 108 (90 Base) MCG/ACT inhaler Inhale 2 puffs into the lungs every 6 (six) hours as needed for wheezing or shortness of breath.     ALPRAZolam (XANAX) 0.25 MG tablet Take 0.25 mg by mouth 2 (two) times daily as needed for anxiety.     aspirin EC 81 MG tablet Take 81 mg by mouth daily.     carvedilol (COREG) 6.25 MG tablet Take 6.25 mg by mouth 2 (two) times daily  with a meal.     celecoxib (CELEBREX) 100 MG capsule Take 1 capsule (100 mg total) by mouth 2 (two) times daily. 15 capsule 1   Coenzyme Q10 (CO Q-10) 200 MG CAPS Take 1 capsule by mouth daily.     dicyclomine (BENTYL) 20 MG tablet Take 20 mg by mouth every 8 (eight) hours as needed.     dorzolamide (TRUSOPT) 2 % ophthalmic solution Place 1 drop into both eyes 2 (two) times daily.     FLOVENT HFA 110 MCG/ACT inhaler Inhale 1 puff into the lungs 2 (two) times daily.     fluticasone (FLONASE) 50 MCG/ACT nasal spray Place 2 sprays into both nostrils daily.     furosemide (LASIX) 40 MG tablet Take 20 mg by mouth daily.     hydrALAZINE (APRESOLINE) 25 MG tablet Take 12.5 mg by mouth as needed (high blood pressure).     isosorbide mononitrate (IMDUR) 30 MG 24 hr tablet Take 1 tablet (30 mg total) by mouth daily. 90 tablet 2   latanoprost (XALATAN) 0.005 % ophthalmic solution Place 1  drop into both eyes at bedtime.     linaclotide (LINZESS) 72 MCG capsule Take 72 mcg by mouth daily.     Multiple Vitamin (MULTIVITAMIN) tablet Take 1 tablet by mouth daily.     nitroGLYCERIN (NITROSTAT) 0.4 MG SL tablet DISSOLVE 1 TABLET UNDER THE TONGUE EVERY 5 MINUTES AS NEEDED FOR CHEST PAIN. DO NOT EXCEED A TOTAL OF 3 DOSES IN 15 MINUTES. 25 tablet 5   potassium chloride (MICRO-K) 10 MEQ CR capsule Take 10 mEq by mouth every morning.     prednisoLONE acetate (PRED FORTE) 1 % ophthalmic suspension Place 1 drop into both eyes 4 (four) times daily.     rosuvastatin (CRESTOR) 20 MG tablet Take 20 mg by mouth 3 (three) times a week.     sucralfate (CARAFATE) 1 g tablet Take 1 g by mouth 3 (three) times daily as needed.     TIMOLOL MALEATE OP Apply 1 drop to eye 2 (two) times daily.     traMADol-acetaminophen (ULTRACET) 37.5-325 MG tablet Take 1-2 tablets by mouth 2 (two) times daily as needed for pain.     VOQUEZNA 20 MG TABS Take 1 tablet by mouth daily.     omeprazole (PRILOSEC) 40 MG capsule Take 40 mg by mouth at  bedtime. (Patient not taking: Reported on 08/20/2023)     No current facility-administered medications for this visit.    Allergies  Allergen Reactions   Antihistamines, Chlorpheniramine-Type Anaphylaxis   Ativan [Lorazepam] Swelling   Ceftin [Cefuroxime Axetil]    Meloxicam    Pheneen [Benzalkonium Chloride]    Morphine Palpitations    Review of Systems:  neg     Physical Exam:    BP (!) 144/60 (BP Location: Left Arm, Patient Position: Sitting, Cuff Size: Normal)   Pulse 72   Ht 5\' 3"  (1.6 m)   Wt 157 lb 4 oz (71.3 kg)   BMI 27.86 kg/m  Filed Weights   08/20/23 0828  Weight: 157 lb 4 oz (71.3 kg)   Gen: awake, alert, NAD HEENT: anicteric, no pallor CV: RRR, no mrg Pulm: CTA b/l Abd: soft, NT/ND, +BS throughout Ext: no c/c/e Neuro: nonfocal  Data Reviewed: I have personally reviewed following labs and imaging studies  CBC:    Latest Ref Rng & Units 12/28/2017    7:43 AM  CBC  Hemoglobin 12.0 - 15.0 g/dL 78.2   Hematocrit 95.6 - 46.0 % 39.0     CMP:    Latest Ref Rng & Units 03/17/2023   10:28 AM 01/12/2023   10:03 AM 12/29/2022   10:54 AM  CMP  Glucose 70 - 99 mg/dL  98  213   BUN 8 - 27 mg/dL  13  9   Creatinine 0.86 - 1.00 mg/dL  5.78  4.69   Sodium 629 - 144 mmol/L  141  141   Potassium 3.5 - 5.2 mmol/L  3.9  4.0   Chloride 96 - 106 mmol/L  99  100   CO2 20 - 29 mmol/L  27  23   Calcium 8.7 - 10.3 mg/dL  9.9  9.3   Total Protein 6.0 - 8.5 g/dL 6.9  6.4    Total Bilirubin 0.0 - 1.2 mg/dL 0.3  0.4    Alkaline Phos 44 - 121 IU/L 77  69    AST 0 - 40 IU/L 15  14    ALT 0 - 32 IU/L 12  12       Edman Circle, MD 08/20/2023, 8:44  AM  Cc: Lise Auer, MD

## 2023-08-21 ENCOUNTER — Telehealth: Payer: Self-pay | Admitting: Gastroenterology

## 2023-08-21 DIAGNOSIS — R0602 Shortness of breath: Secondary | ICD-10-CM

## 2023-08-21 NOTE — Telephone Encounter (Signed)
Patient still seeking advise. Please advise call [phone]

## 2023-08-21 NOTE — Telephone Encounter (Signed)
Unable to reach pt nor leave voice mail due to pt mail box being full at this time.

## 2023-08-21 NOTE — Telephone Encounter (Signed)
Inbound call from patient stating that she is scheduled for an EGD on 1/3 and is in pain. Requesting to speak with nurse. Please advise.

## 2023-08-21 NOTE — Telephone Encounter (Signed)
Spoke with patient & she stated the pain is the same as what she was seen for yesterday with Dr. Chales Abrahams. She was calling to see if there was an EGD open any sooner. Currently there are no sooner openings, however advised her I would keep her on my list & will let her know if one becomes available sooner. Also advised her that if pain & symptoms worsen prior to then to go to ED. Pt verbalized all understanding.

## 2023-08-25 NOTE — Telephone Encounter (Addendum)
She said she can become short winded at times but she said she is having some discomfort/sore in her chest and back and everything per patient and she has been rubbing the Biofreeze and has been like this since she seen [place]. It hasn't gotten any worse and she said it feels like she something isn't right and thinking she has develop a ulcer. She was made aware her chest xray came back negative too. Please advise

## 2023-08-26 NOTE — Telephone Encounter (Signed)
Not sure about shortness of breath with negative chest x-ray Can she make appointment with pulmonary or if we could help her out to make appointment [facility]

## 2023-08-26 NOTE — Telephone Encounter (Addendum)
Patient has been scheduled for [facility] but wants a sooner appointment and asked if I could see about another location. I told her I would see and get back to her and if she has any recommendation. [facility] potential does a sooner appointment potentially and told patient I can send the referral there and she can see about what she wants to do when they call her and call me back regarding what she is doing for a pulmonologist referral and she voiced understanding  Referral sent to [place] [phone] P [phone]

## 2023-08-26 NOTE — Addendum Note (Signed)
Addended by: Alberteen Sam E on: 08/26/2023 04:06 PM   Modules accepted: Orders

## 2023-08-30 ENCOUNTER — Encounter: Payer: Self-pay | Admitting: Certified Registered Nurse Anesthetist

## 2023-09-07 DIAGNOSIS — R42 Dizziness and giddiness: Secondary | ICD-10-CM | POA: Diagnosis not present

## 2023-09-07 DIAGNOSIS — R0602 Shortness of breath: Secondary | ICD-10-CM | POA: Diagnosis not present

## 2023-09-07 DIAGNOSIS — I10 Essential (primary) hypertension: Secondary | ICD-10-CM | POA: Diagnosis not present

## 2023-09-11 ENCOUNTER — Encounter: Payer: Self-pay | Admitting: Gastroenterology

## 2023-09-11 ENCOUNTER — Ambulatory Visit: Payer: PPO | Admitting: Gastroenterology

## 2023-09-11 VITALS — BP 111/66 | HR 68 | Temp 98.1°F | Resp 14 | Ht 63.0 in | Wt 157.0 lb

## 2023-09-11 DIAGNOSIS — B3781 Candidal esophagitis: Secondary | ICD-10-CM | POA: Diagnosis not present

## 2023-09-11 DIAGNOSIS — R1310 Dysphagia, unspecified: Secondary | ICD-10-CM

## 2023-09-11 DIAGNOSIS — I2510 Atherosclerotic heart disease of native coronary artery without angina pectoris: Secondary | ICD-10-CM | POA: Diagnosis not present

## 2023-09-11 DIAGNOSIS — K317 Polyp of stomach and duodenum: Secondary | ICD-10-CM

## 2023-09-11 DIAGNOSIS — K3189 Other diseases of stomach and duodenum: Secondary | ICD-10-CM

## 2023-09-11 DIAGNOSIS — K219 Gastro-esophageal reflux disease without esophagitis: Secondary | ICD-10-CM | POA: Diagnosis not present

## 2023-09-11 DIAGNOSIS — K449 Diaphragmatic hernia without obstruction or gangrene: Secondary | ICD-10-CM

## 2023-09-11 DIAGNOSIS — K2970 Gastritis, unspecified, without bleeding: Secondary | ICD-10-CM | POA: Diagnosis not present

## 2023-09-11 DIAGNOSIS — K2950 Unspecified chronic gastritis without bleeding: Secondary | ICD-10-CM | POA: Diagnosis not present

## 2023-09-11 DIAGNOSIS — J449 Chronic obstructive pulmonary disease, unspecified: Secondary | ICD-10-CM | POA: Diagnosis not present

## 2023-09-11 MED ORDER — SODIUM CHLORIDE 0.9 % IV SOLN
500.0000 mL | Freq: Once | INTRAVENOUS | Status: DC
Start: 2023-09-11 — End: 2023-09-11

## 2023-09-11 NOTE — Progress Notes (Signed)
 Called to room to assist during endoscopic procedure.  Patient ID and intended procedure confirmed with present staff. Received instructions for my participation in the procedure from the performing physician.

## 2023-09-11 NOTE — Progress Notes (Signed)
 Chief Complaint: FU  Referring Provider:  Fernand Tracey LABOR, MD      ASSESSMENT AND PLAN;   #1. GERD with eso dysphagia d/t Schatzki's ring s/p dil 50Fr 11/2018 with complete resolution. #2. Epi pain/NCCP-also with costochondritis and cervical/thoracic spondylarthritis. Neg CTA chest/Abdo/pelvis 05/2021, neg cardiac Stress test 05/2021, neg US  10/2019. Nl CBC, CMP #3. Constipation -likely exacerbated by medications like amlodipine  (better). #4. FH colon cancer (brother with stage IV colon cancer at age 75). Neg colon 11/2018 except for mod sig div. No need to repeat unless new problems. #5. Asthma/COPD  Plan:  - Continue Voquenza 20mg  po QD (samples) - EGD with dil - Biofreeze PRN/heating pads for costochondritis/musculoskeletal component. - Linzess 72 mcg po every day - CBC, CMP, lipase - Cx PA and lat   Proceed with EGD with dil. I have discussed the risks and benefits. The risks including rare risk of perforation, bleeding, missed UGI neoplasms, risks of anesthesia/sedation. Alternatives were given. Patient is aware and agrees to proceed. All the questions were answered. This will be scheduled in upcoming days. Consent forms were given for review.  HPI:    Sheila [name] is a 83 y.o. female   FU Has been having noncardiac chest pains associated with solid food dysphagia. Chest pains may or may not be related to food intake.  She is compliant with Protonix .  Previous EGD with dilation did help with dysphagia and chest pains.  Occ Epi pain, especially after eating, associated occasional heartburn.  Mild constipation, better with linzess  Neg cardiology [facility]-cardiology note reviewed   From previous notes- ED to [facility] 05/2021 For atypical chest pains. Neg CTA chest/abdo/pelvis, neg cardiac stress test Dx with costochronditis.  Again came to ED 08/06/2021 with atypical chest pains.  Has seen Dr. Sonia cardiac.  GI work-up advised.  No melena or hematochezia.  Did  complain of some shortness of breath-has been taking inhalers.  No wheezing on today's exam.   From previous notes  Adm to [facility] (adm 2/4-10/14/2019) d/t chest pain/epigastric pain. Negative Lexiscan  stress test (EF 64%), neg RUQ US . Nl CBC, CMP and lipase.  2020- ED at [facility] with chest pains-found to have mild pneumonia, treated with Levaquin.  Subsequent CT chest on 02/11/2019 showed complete resolution of previous pulmonary abnormalities but showed new scattered groundglass opacities measuring 7 mm in the right lung.  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   Past GI procedures: -Colonoscopy 11/2018 ([facility])-moderate sigmoid diverticulosis.  6 mm polyp status post polypectomy. Bx- neg. no need to repeat unless problems. -EGD 11/2018 Schatzki's ring status post esophageal dilatation 50 Fr, small hiatal hernia, few duodenal erosions.  Negative biopsies for HP. -CT Abdo/pelvis with contrast: 11/11/2018-neg except for DJD thoracolumbar spine, calcified uterine fibroids, sigmoid div without diverticulitis. - CT chest 02/11/2019: 3.6 cm ascending thoracic aortic aneurysm, 3.7 cm descending thoracic aortic aneurysm, coronary calcification.  Being followed by Dr. Deretha and Dr. Edwyna. 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HCC) 05/18/2020   Coronary artery disease involving native coronary artery of native heart without angina pectoris 10/10/2015   Depression    Dyslipidemia 10/10/2015   Dyspnea  11/21/2019   Essential hypertension 08/28/2016   Eustachian tube dysfunction, right 11/28/2022   GERD (gastroesophageal reflux disease)    Glaucoma    Headache above the eye region 11/10/2017   Heart murmur     History of right mastoidectomy 06/29/2020   Hyperlipemia 11/28/2022   Hyperlipidemia    Hypertension    Imbalance 04/17/2022   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    EYE SURGERY     LEFT HEART CATH AND CORONARY ANGIOGRAPHY N/A 12/28/2017   Procedure: LEFT HEART CATH AND CORONARY ANGIOGRAPHY;  Surgeon: Claudene Victory ORN, MD;  Location: [facility];  Service: Cardiovascular;  Laterality: N/A;   MASTOIDECTOMY     PATENT DUCTUS ARTERIOUS REPAIR     SHOULDER OPEN ROTATOR CUFF REPAIR Bilateral    UPPER GASTROINTESTINAL ENDOSCOPY  10/15/2021    Family History  Problem Relation Age of Onset   Heart attack Mother    Heart attack Sister    Stomach cancer Sister    Colon cancer Brother    Diabetes Brother    Diabetes Daughter    Esophageal cancer Neg Hx    Colon polyps Neg Hx    Rectal cancer Neg Hx     Social History   Tobacco Use   Smoking status: Never   Smokeless tobacco: Never  Vaping Use   Vaping status: Never Used  Substance Use Topics   Alcohol use: No   Drug use: Never    Current Outpatient Medications  Medication Sig Dispense Refill   albuterol  (VENTOLIN  HFA) 108 (90 Base) MCG/ACT inhaler Inhale 2 puffs into the lungs every 6 (six) hours as needed for wheezing or shortness of breath.     ALPRAZolam (XANAX) 0.25 MG tablet Take 0.25 mg by mouth 2 (two) times daily as needed for anxiety.     aspirin  EC 81 MG tablet Take 81 mg by mouth daily.     carvedilol (COREG) 6.25 MG  tablet Take 6.25 mg by mouth 2 (two) times daily with a meal.     celecoxib  (CELEBREX ) 100 MG capsule Take 1 capsule (100 mg total) by mouth 2 (two) times daily. 15 capsule 1   Coenzyme Q10 (CO Q-10) 200 MG CAPS Take 1 capsule by mouth daily.     dicyclomine (BENTYL) 20 MG tablet Take 20 mg by mouth every 8 (eight) hours as needed.     dorzolamide (TRUSOPT) 2 % ophthalmic solution Place 1 drop into both eyes 2 (two) times daily.     dorzolamide-timolol (COSOPT) 2-0.5 % ophthalmic solution 1 drop 2 (two) times daily.     fluticasone (FLONASE) 50 MCG/ACT nasal spray Place 2 sprays into both nostrils daily.     furosemide  (LASIX ) 40 MG tablet Take 20 mg by mouth daily.     hydrALAZINE  (APRESOLINE ) 25 MG tablet Take 12.5 mg by mouth as needed (high blood pressure).     isosorbide  mononitrate (IMDUR ) 30 MG 24 hr tablet Take 1 tablet (30 mg total) by mouth daily. 90 tablet  2   latanoprost (XALATAN) 0.005 % ophthalmic solution Place 1 drop into both eyes at bedtime.     linaclotide (LINZESS) 72 MCG capsule Take 72 mcg by mouth daily.     Multiple Vitamin (MULTIVITAMIN) tablet Take 1 tablet by mouth daily.     omeprazole (PRILOSEC) 40 MG capsule Take 40 mg by mouth at bedtime.     potassium chloride (MICRO-K) 10 MEQ CR capsule Take 10 mEq by mouth every morning.     prednisoLONE acetate (PRED FORTE) 1 % ophthalmic suspension Place 1 drop into both eyes 4 (four) times daily.     rosuvastatin  (CRESTOR ) 40 MG tablet Take 40 mg by mouth 3 (three) times a week.     timolol (TIMOPTIC) 0.5 % ophthalmic solution 1 drop 2 (two) times daily.     VOQUEZNA 20 MG TABS Take 1 tablet by mouth daily.     FLOVENT HFA 110 MCG/ACT inhaler Inhale 1 puff into the lungs 2 (two) times daily.     nitroGLYCERIN  (NITROSTAT ) 0.4 MG SL tablet DISSOLVE 1 TABLET UNDER THE TONGUE EVERY 5 MINUTES AS NEEDED FOR CHEST PAIN. DO NOT EXCEED A TOTAL OF 3 DOSES IN 15 MINUTES. (Patient not taking: Reported on 09/11/2023) 25 tablet 5    sucralfate (CARAFATE) 1 g tablet Take 1 g by mouth 3 (three) times daily as needed. (Patient not taking: Reported on 09/11/2023)     traMADol-acetaminophen  (ULTRACET) 37.5-325 MG tablet Take 1-2 tablets by mouth 2 (two) times daily as needed for pain.     No current facility-administered medications for this visit.    Allergies  Allergen Reactions   Antihistamines, Chlorpheniramine-Type Anaphylaxis   Ceftin [Cefuroxime Axetil] Shortness Of Breath   Pheneen [Benzalkonium Chloride] Shortness Of Breath   Ativan [Lorazepam] Swelling   Meloxicam Palpitations   Morphine  Palpitations    Review of Systems:  neg     Physical Exam:    BP 111/66   Pulse 68   Temp 98.1 F (36.7 C)   Resp 14   Ht 5' 3 (1.6 m)   Wt 157 lb (71.2 kg)   SpO2 98%   BMI 27.81 kg/m  Filed Weights   09/11/23 0804  Weight: 157 lb (71.2 kg)   Gen: awake, alert, NAD HEENT: anicteric, no pallor CV: RRR, no mrg Pulm: CTA b/l Abd: soft, NT/ND, +BS throughout Ext: no c/c/e Neuro: nonfocal  Data Reviewed: I have personally reviewed following labs and imaging studies  CBC:    Latest Ref Rng & Units 08/20/2023    9:45 AM 12/28/2017    7:43 AM  CBC  WBC 4.0 - 10.5 K/uL 5.5    Hemoglobin 12.0 - 15.0 g/dL 86.6  86.6   Hematocrit 36.0 - 46.0 % 41.1  39.0   Platelets 150.0 - 400.0 K/uL 190.0      CMP:    Latest Ref Rng & Units 08/20/2023    9:45 AM 03/17/2023   10:28 AM 01/12/2023   10:03 AM  CMP  Glucose 70 - 99 mg/dL 56   98   BUN 6 - 23 mg/dL 11   13   Creatinine 9.59 - 1.20 mg/dL 9.41   9.34   Sodium 864 - 145 mEq/L 140   141   Potassium 3.5 - 5.1 mEq/L 3.8   3.9   Chloride 96 - 112 mEq/L 101   99   CO2 19 - 32 mEq/L 31   27   Calcium  8.4 - 10.5 mg/dL 9.5  9.9   Total Protein 6.0 - 8.3 g/dL 7.0  6.9  6.4   Total Bilirubin 0.2 - 1.2 mg/dL 0.4  0.3  0.4   Alkaline Phos 39 - 117 U/L 59  77  69   AST 0 - 37 U/L 11  15  14    ALT 0 - 35 U/L 8  12  12       Anselm Bring, MD 09/11/2023, 4:01 PM  Cc:  Fernand Tracey LABOR, MD

## 2023-09-11 NOTE — Progress Notes (Signed)
0915 Robinul 0.1 mg IV given due large amount of secretions upon assessment.  MD made aware, vss  

## 2023-09-11 NOTE — Patient Instructions (Signed)
 Resume all of your previous medications today as ordered.  Read your discharge instructions.  Follow your diet restrictions today.  YOU HAD AN ENDOSCOPIC PROCEDURE TODAY AT THE [facility]:   Refer to the procedure report that was given to you for any specific questions about what was found during the examination.  If the procedure report does not answer your questions, please call your gastroenterologist to clarify.  If you requested that your care partner not be given the details of your procedure findings, then the procedure report has been included in a sealed envelope for you to review at your convenience later.  YOU SHOULD EXPECT: Some feelings of bloating in the abdomen. Passage of more gas than usual.  Walking can help get rid of the air that was put into your GI tract during the procedure and reduce the bloating.   Please Note:  You might notice some irritation and congestion in your nose or some drainage.  This is from the oxygen used during your procedure.  There is no need for concern and it should clear up in a day or so.  SYMPTOMS TO REPORT IMMEDIATELY:  Following upper endoscopy (EGD)  Vomiting of blood or coffee ground material  New chest pain or pain under the shoulder blades  Painful or persistently difficult swallowing  New shortness of breath  Fever of 100F or higher  Black, tarry-looking stools  For urgent or emergent issues, a gastroenterologist can be reached at any hour by calling [phone]. Do not use MyChart messaging for urgent concerns.    DIET:  We do recommend a soft diet for today, but then you may proceed to your regular diet tomorrow..  Drink plenty of fluids but you should avoid alcoholic beverages for 24 hours.  ACTIVITY:  You should plan to take it easy for the rest of today and you should NOT DRIVE or use heavy machinery until tomorrow (because of the sedation medicines used during the test).    FOLLOW UP: Our staff will call the  number listed on your records the next business day following your procedure.  We will call around 7:15- 8:00 am to check on you and address any questions or concerns that you may have regarding the information given to you following your procedure. If we do not reach you, we will leave a message.     If any biopsies were taken you will be contacted by phone or by letter within the next 1-3 weeks.  Please call us  at [phone] if you have not heard about the biopsies in 3 weeks.    SIGNATURES/CONFIDENTIALITY: You and/or your care partner have signed paperwork which will be entered into your electronic medical record.  These signatures attest to the fact that that the information above on your After Visit Summary has been reviewed and is understood.  Full responsibility of the confidentiality of this discharge information lies with you and/or your care-partner.

## 2023-09-11 NOTE — Op Note (Signed)
 [facility] Patient Name: Sheila [name] Procedure Date: 09/11/2023 9:11 AM MRN: [id number] Endoscopist: Lynnie Bring , MD, [id number] Age: 83 Referring MD:  Date of Birth: [birth year] Gender: Female Account #: [id number] Procedure:                Upper GI endoscopy Indications:              Dysphagia Medicines:                Monitored Anesthesia Care Procedure:                Pre-Anesthesia Assessment:                           - Prior to the procedure, a History and Physical                            was performed, and patient medications and                            allergies were reviewed. The patient's tolerance of                            previous anesthesia was also reviewed. The risks                            and benefits of the procedure and the sedation                            options and risks were discussed with the patient.                            All questions were answered, and informed consent                            was obtained. Prior Anticoagulants: The patient has                            taken no anticoagulant or antiplatelet agents. ASA                            Grade Assessment: II - A patient with mild systemic                            disease. After reviewing the risks and benefits,                            the patient was deemed in satisfactory condition to                            undergo the procedure.                           After obtaining informed consent, the endoscope was  passed under direct vision. Throughout the                            procedure, the patient's blood pressure, pulse, and                            oxygen saturations were monitored continuously. The                            GIF [id number] #[id number] was introduced through the                            mouth, and advanced to the second part of duodenum.                            The upper GI endoscopy was accomplished without                             difficulty. The patient tolerated the procedure                            well. Scope In: Scope Out: Findings:                 No endoscopic abnormality was evident in the                            esophagus to explain the patient's complaint of                            dysphagia. 2 small whitish lesions were noted in                            the proximal esophagus. These were biopsied to rule                            out mild Candida esophagitis. It was decided,                            however, to proceed with dilation of the entire                            esophagus. The scope was withdrawn. Dilation was                            performed with a Maloney dilator with mild                            resistance at 52 Fr and 54 Fr. Biopsies were taken                            with a cold forceps for histology. The small  transient hiatal hernia was noted.                           Localized mild inflammation characterized by                            congestion (edema) and erythema was found in the                            gastric antrum. Biopsies were taken with a cold                            forceps for histology. Again noted were multiple                            small gastric polyps measuring 2 to 3 mm. These                            were previously deemed to be fundic gland polyps.                            Hence, not biopsied.                           The examined duodenum was normal. Complications:            No immediate complications. Estimated Blood Loss:     Estimated blood loss: none. Impression:               - Small whitish lesions in the proximal esophagus                            -biopsied to rule out mild Candida esophagitis.                           - Small hiatal hernia.                           - S/p empiric esophageal dilatation. Recommendation:           - Patient has a contact  number available for                            emergencies. The signs and symptoms of potential                            delayed complications were discussed with the                            patient. Return to normal activities tomorrow.                            Written discharge instructions were provided to the                            patient.                           -  Post dilatation diet.                           - Continue present medications.                           - Await pathology results.                           - The findings and recommendations were discussed                            with the patient's family. Lynnie Bring, MD 09/11/2023 9:46:38 AM This report has been signed electronically.

## 2023-09-11 NOTE — Progress Notes (Signed)
 Pt's states no medical or surgical changes since previsit or office visit.

## 2023-09-11 NOTE — Progress Notes (Signed)
 [id number] Report given to PACU, vss

## 2023-09-14 ENCOUNTER — Telehealth: Payer: Self-pay

## 2023-09-14 NOTE — Telephone Encounter (Signed)
  Follow up Call-     09/11/2023    8:14 AM 10/15/2021    2:49 PM  Call back number  Post procedure Call Back phone  # [phone]  Permission to leave phone message Yes Yes     Post op call attempted, no answer, no VM.

## 2023-09-15 DIAGNOSIS — I10 Essential (primary) hypertension: Secondary | ICD-10-CM | POA: Diagnosis not present

## 2023-09-15 DIAGNOSIS — Z6828 Body mass index (BMI) 28.0-28.9, adult: Secondary | ICD-10-CM | POA: Diagnosis not present

## 2023-09-16 LAB — SURGICAL PATHOLOGY

## 2023-09-23 ENCOUNTER — Encounter: Payer: Self-pay | Admitting: Gastroenterology

## 2023-09-23 DIAGNOSIS — R0609 Other forms of dyspnea: Secondary | ICD-10-CM | POA: Diagnosis not present

## 2023-09-23 DIAGNOSIS — J449 Chronic obstructive pulmonary disease, unspecified: Secondary | ICD-10-CM | POA: Diagnosis not present

## 2023-09-23 DIAGNOSIS — Z122 Encounter for screening for malignant neoplasm of respiratory organs: Secondary | ICD-10-CM | POA: Diagnosis not present

## 2023-09-23 DIAGNOSIS — Z2839 Other underimmunization status: Secondary | ICD-10-CM | POA: Diagnosis not present

## 2023-09-23 DIAGNOSIS — Z23 Encounter for immunization: Secondary | ICD-10-CM | POA: Diagnosis not present

## 2023-09-25 ENCOUNTER — Other Ambulatory Visit: Payer: Self-pay

## 2023-09-25 DIAGNOSIS — B379 Candidiasis, unspecified: Secondary | ICD-10-CM

## 2023-09-25 MED ORDER — FLUCONAZOLE 100 MG PO TABS: ORAL_TABLET | ORAL | 0 refills | Status: DC

## 2023-10-05 DIAGNOSIS — J449 Chronic obstructive pulmonary disease, unspecified: Secondary | ICD-10-CM | POA: Diagnosis not present

## 2023-10-05 DIAGNOSIS — J984 Other disorders of lung: Secondary | ICD-10-CM | POA: Diagnosis not present

## 2023-10-05 DIAGNOSIS — R0609 Other forms of dyspnea: Secondary | ICD-10-CM | POA: Diagnosis not present

## 2023-10-06 ENCOUNTER — Other Ambulatory Visit: Payer: Self-pay | Admitting: Cardiology

## 2023-10-07 DIAGNOSIS — Z6827 Body mass index (BMI) 27.0-27.9, adult: Secondary | ICD-10-CM | POA: Diagnosis not present

## 2023-10-07 DIAGNOSIS — R059 Cough, unspecified: Secondary | ICD-10-CM | POA: Diagnosis not present

## 2023-10-09 DIAGNOSIS — R0600 Dyspnea, unspecified: Secondary | ICD-10-CM | POA: Diagnosis not present

## 2023-10-09 DIAGNOSIS — R0602 Shortness of breath: Secondary | ICD-10-CM | POA: Diagnosis not present

## 2023-10-09 DIAGNOSIS — R9431 Abnormal electrocardiogram [ECG] [EKG]: Secondary | ICD-10-CM | POA: Diagnosis not present

## 2023-10-09 DIAGNOSIS — I517 Cardiomegaly: Secondary | ICD-10-CM | POA: Diagnosis not present

## 2023-10-09 DIAGNOSIS — J449 Chronic obstructive pulmonary disease, unspecified: Secondary | ICD-10-CM | POA: Diagnosis not present

## 2023-10-09 DIAGNOSIS — R079 Chest pain, unspecified: Secondary | ICD-10-CM | POA: Diagnosis not present

## 2023-10-09 DIAGNOSIS — E049 Nontoxic goiter, unspecified: Secondary | ICD-10-CM | POA: Diagnosis not present

## 2023-10-14 DIAGNOSIS — Z6827 Body mass index (BMI) 27.0-27.9, adult: Secondary | ICD-10-CM | POA: Diagnosis not present

## 2023-10-14 DIAGNOSIS — R0609 Other forms of dyspnea: Secondary | ICD-10-CM | POA: Diagnosis not present

## 2023-10-14 DIAGNOSIS — E041 Nontoxic single thyroid nodule: Secondary | ICD-10-CM | POA: Diagnosis not present

## 2023-10-25 ENCOUNTER — Other Ambulatory Visit: Payer: Self-pay | Admitting: Cardiology

## 2023-10-26 ENCOUNTER — Institutional Professional Consult (permissible substitution): Payer: PPO | Admitting: Internal Medicine

## 2023-10-26 ENCOUNTER — Encounter: Payer: Self-pay | Admitting: Internal Medicine

## 2023-10-26 NOTE — Telephone Encounter (Signed)
 Prescription sent to pharmacy.

## 2023-11-02 ENCOUNTER — Other Ambulatory Visit: Payer: Self-pay | Admitting: Cardiology

## 2023-11-04 ENCOUNTER — Other Ambulatory Visit: Payer: Self-pay

## 2023-11-04 ENCOUNTER — Telehealth: Payer: Self-pay

## 2023-11-04 ENCOUNTER — Encounter (HOSPITAL_BASED_OUTPATIENT_CLINIC_OR_DEPARTMENT_OTHER): Payer: Self-pay | Admitting: Emergency Medicine

## 2023-11-04 ENCOUNTER — Emergency Department (HOSPITAL_BASED_OUTPATIENT_CLINIC_OR_DEPARTMENT_OTHER)
Admission: EM | Admit: 2023-11-04 | Discharge: 2023-11-04 | Disposition: A | Discharge status: Home / Self Care | Payer: PPO | Attending: Emergency Medicine | Admitting: Emergency Medicine

## 2023-11-04 ENCOUNTER — Emergency Department (HOSPITAL_BASED_OUTPATIENT_CLINIC_OR_DEPARTMENT_OTHER): Payer: PPO

## 2023-11-04 DIAGNOSIS — M542 Cervicalgia: Secondary | ICD-10-CM | POA: Diagnosis not present

## 2023-11-04 DIAGNOSIS — J45909 Unspecified asthma, uncomplicated: Secondary | ICD-10-CM | POA: Diagnosis not present

## 2023-11-04 DIAGNOSIS — I10 Essential (primary) hypertension: Secondary | ICD-10-CM | POA: Diagnosis not present

## 2023-11-04 DIAGNOSIS — I700 Atherosclerosis of aorta: Secondary | ICD-10-CM | POA: Diagnosis not present

## 2023-11-04 DIAGNOSIS — R072 Precordial pain: Secondary | ICD-10-CM | POA: Diagnosis not present

## 2023-11-04 DIAGNOSIS — J449 Chronic obstructive pulmonary disease, unspecified: Secondary | ICD-10-CM | POA: Insufficient documentation

## 2023-11-04 DIAGNOSIS — R0602 Shortness of breath: Secondary | ICD-10-CM | POA: Insufficient documentation

## 2023-11-04 DIAGNOSIS — M25512 Pain in left shoulder: Secondary | ICD-10-CM | POA: Diagnosis not present

## 2023-11-04 DIAGNOSIS — R079 Chest pain, unspecified: Secondary | ICD-10-CM | POA: Diagnosis not present

## 2023-11-04 LAB — CBC WITH DIFFERENTIAL/PLATELET
Abs Immature Granulocytes: 0.01 10*3/uL (ref 0.00–0.07)
Basophils Absolute: 0 10*3/uL (ref 0.0–0.1)
Basophils Relative: 1 %
Eosinophils Absolute: 0.2 10*3/uL (ref 0.0–0.5)
Eosinophils Relative: 5 %
HCT: 42.5 % (ref 36.0–46.0)
Hemoglobin: 13.5 g/dL (ref 12.0–15.0)
Immature Granulocytes: 0 %
Lymphocytes Relative: 41 %
Lymphs Abs: 2.1 10*3/uL (ref 0.7–4.0)
MCH: 26.2 pg (ref 26.0–34.0)
MCHC: 31.8 g/dL (ref 30.0–36.0)
MCV: 82.5 fL (ref 80.0–100.0)
Monocytes Absolute: 0.7 10*3/uL (ref 0.1–1.0)
Monocytes Relative: 14 %
Neutro Abs: 2 10*3/uL (ref 1.7–7.7)
Neutrophils Relative %: 39 %
Platelets: 197 10*3/uL (ref 150–400)
RBC: 5.15 MIL/uL — ABNORMAL HIGH (ref 3.87–5.11)
RDW: 14.9 % (ref 11.5–15.5)
WBC: 5.1 10*3/uL (ref 4.0–10.5)
nRBC: 0 % (ref 0.0–0.2)

## 2023-11-04 LAB — COMPREHENSIVE METABOLIC PANEL
ALT: 15 U/L (ref 0–44)
AST: 15 U/L (ref 15–41)
Albumin: 3.7 g/dL (ref 3.5–5.0)
Alkaline Phosphatase: 55 U/L (ref 38–126)
Anion gap: 10 (ref 5–15)
BUN: 13 mg/dL (ref 8–23)
CO2: 26 mmol/L (ref 22–32)
Calcium: 9.4 mg/dL (ref 8.9–10.3)
Chloride: 100 mmol/L (ref 98–111)
Creatinine, Ser: 0.48 mg/dL (ref 0.44–1.00)
GFR, Estimated: >60 mL/min (ref >60)
Glucose, Bld: 74 mg/dL (ref 70–99)
Potassium: 3.8 mmol/L (ref 3.5–5.1)
Sodium: 136 mmol/L (ref 135–145)
Total Bilirubin: 0.8 mg/dL (ref 0.0–1.2)
Total Protein: 7 g/dL (ref 6.5–8.1)

## 2023-11-04 LAB — TROPONIN I (HIGH SENSITIVITY): Troponin I (High Sensitivity): 9 ng/L (ref <18)

## 2023-11-04 LAB — LIPASE, BLOOD: Lipase: 20 U/L (ref 11–51)

## 2023-11-04 MED ORDER — IPRATROPIUM-ALBUTEROL 0.5-2.5 (3) MG/3ML IN SOLN
3.0000 mL | Freq: Once | RESPIRATORY_TRACT | Status: AC
  Administered 2023-11-04: 3 mL via RESPIRATORY_TRACT
  Filled 2023-11-04: qty 3

## 2023-11-04 NOTE — Discharge Instructions (Signed)

## 2023-11-04 NOTE — ED Provider Notes (Signed)
 Emergency Department Provider Note   I have reviewed the triage vital signs and the nursing notes.   HISTORY  Chief Complaint Chest Pain   HPI Sheila [name] is a 83 y.o. female with past medical history reviewed below presents emergency department for evaluation of continued chest pain and shortness of breath.  She has history of COPD and listed history of asthma.  She states she has had discomfort in her chest intermittently over the past year and has seen multiple providers regarding this including prior ED presentation, cardiology, gastroenterology.  Last night, she had particularly bad discomfort in the center of her chest which felt like tightness and soreness.  She felt some associated shortness of breath and wheezing.  She did MDI through the night and is feeling somewhat better from a shortness of breath standpoint but continues to have chest discomfort.  She called her cardiologist who advised that she present to the ED.  No fevers or chills.  No vomiting.  No diaphoresis.   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HCC) 05/18/2020   Coronary artery disease involving native coronary artery of native heart without angina pectoris 10/10/2015   Depression    Dyslipidemia 10/10/2015   Dyspnea 11/21/2019   Essential hypertension 08/28/2016   Eustachian tube dysfunction, right 11/28/2022   GERD (gastroesophageal reflux disease)    Glaucoma    Headache above the eye region 11/10/2017   Heart murmur    History of right mastoidectomy 06/29/2020   Hyperlipemia 11/28/2022   Hyperlipidemia    Hypertension    Imbalance 04/17/2022   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 Respiratory: Positive shortness of breath. Gastrointestinal: No abdominal pain.  No nausea, no vomiting.  No diarrhea.   Musculoskeletal: Negative for back pain. Skin: Negative for rash. Neurological: Negative for headaches.  ____________________________________________   PHYSICAL EXAM:  VITAL SIGNS: ED Triage Vitals  Encounter Vitals Group     BP 11/04/23 1457 (!) 188/76     Pulse Rate 11/04/23 1457 73     Resp 11/04/23 1457 20     Temp 11/04/23 1457 97.8 F (36.6 C)     Temp src --      SpO2 11/04/23 1457 97 %     Weight 11/04/23 1501 150 lb (68 kg)     Height 11/04/23 1501 5\' 3"  (1.6 m)   Constitutional: Alert and oriented. Well appearing and in no acute distress. Eyes: Conjunctivae are normal.  Head: Atraumatic. Nose: No congestion/rhinnorhea. Mouth/Throat: Mucous membranes are moist.  Neck: No stridor.   Cardiovascular: Normal rate, regular rhythm. Good peripheral circulation. Grossly normal heart sounds.   Respiratory: Normal respiratory effort.  No retractions. Lungs with faint end-expiratory wheezes at the bases.  Gastrointestinal: Soft and nontender. No distention.  Musculoskeletal:  No gross  deformities of extremities. Neurologic:  Normal speech and language. Skin:  Skin is warm, dry and intact. No rash noted.  Focal 5 x 5 cm mass located in the center, upper chest.  No significant fluctuance.  No erythema or warmth to touch.  No significant tenderness.  ____________________________________________   LABS (all labs ordered are listed, but only abnormal results are displayed)  Labs  Reviewed  COMPREHENSIVE METABOLIC PANEL  LIPASE, BLOOD  CBC WITH DIFFERENTIAL/PLATELET  TROPONIN I (HIGH SENSITIVITY)   ____________________________________________  EKG   EKG Interpretation Date/Time:  Wednesday November 04 2023 15:01:27 EST Ventricular Rate:  65 PR Interval:  142 QRS Duration:  84 QT Interval:  406 QTC Calculation: 422 R Axis:   74  Text Interpretation: Normal sinus rhythm Right atrial enlargement Minimal voltage criteria for LVH, may be normal variant ( Sokolow-Lyon ) Marked ST abnormality, possible inferior subendocardial injury Abnormal ECG When compared with ECG of 13-Jul-2023 15:28, PREVIOUS ECG IS PRESENT Similar ST changes compared to Nov 2024 Confirmed by Alona Bene [phone]) on 11/04/2023 3:07:15 PM        ____________________________________________  RADIOLOGY  No results found.  ____________________________________________   PROCEDURES  Procedure(s) performed:   Procedures   ____________________________________________   INITIAL IMPRESSION / ASSESSMENT AND PLAN / ED COURSE  Pertinent labs & imaging results that were available during my care of the patient were reviewed by me and considered in my medical decision making (see chart for details).   This patient is Presenting for Evaluation of CP, which does require a range of treatment options, and is a complaint that involves a high risk of morbidity and mortality.  The Differential Diagnoses includes but is not exclusive to acute coronary syndrome, aortic dissection, pulmonary embolism, cardiac tamponade, community-acquired pneumonia, pericarditis, musculoskeletal chest wall pain, etc.   Critical Interventions-    Medications  ipratropium-albuterol (DUONEB) 0.5-2.5 (3) MG/3ML nebulizer solution 3 mL (has no administration in time range)    Reassessment after intervention: SOB improved.   I decided to review pertinent External Data, and in summary patient with CTA coronary in  April 2024 showed mild nonocclusive disease.  Cardiology note from November describes similar pain at that visit, suspected non with cardiac etiology. EKG similar to prior tracings from that time.    Clinical Laboratory Tests Ordered, included ***  Radiologic Tests Ordered, included CXR. I independently interpreted the images and agree with radiology interpretation.   Cardiac Monitor Tracing which shows NSR.    Social Determinants of Health Risk patient is a non-smoker.   Consult complete with  Medical Decision Making: Summary:  Patient presents emergency department for evaluation of chest discomfort.  Patient has had constant chest discomfort since yesterday.  Plan for single troponin, chest x-ray, screening chemistry.  Plan for DuoNeb with faint wheezing at the bases.  Symptoms seem to improve at home with MDI use. Lower suspicion overall for cardiac etiology of pain.  Patient's central, chest mass is small and appears superficial.  Do not think that any emergent workup is required.  Her PCP is having her follow for this as an outpatient according to the patient.   Reevaluation with update and discussion with   ***Considered admission***  Patient's presentation is most consistent with acute presentation with potential threat to life or bodily function.   Disposition:   ____________________________________________  FINAL CLINICAL IMPRESSION(S) / ED DIAGNOSES  Final diagnoses:  None     NEW OUTPATIENT MEDICATIONS STARTED DURING THIS VISIT:  New Prescriptions   No  medications on file    Note:  This document was prepared using Dragon voice recognition software and may include unintentional dictation errors.  Alona Bene, MD, [facility]

## 2023-11-04 NOTE — Telephone Encounter (Signed)
   Pt c/o of Chest Pain: STAT if active CP, including tightness, pressure, jaw pain, radiating pain to shoulder/upper arm/back, CP unrelieved by Nitro. Symptoms reported of SOB, nausea, vomiting, sweating.  1. Are you having CP right now? No     2. Are you experiencing any other symptoms (ex. SOB, nausea, vomiting, sweating)? SOB last night, she does have an inhaler and she said it helped but not lasting.    3. Is your CP continuous or coming and going? Coming and going    4. Have you taken Nitroglycerin? Yes    5. How long have you been experiencing CP? Few days    6. If NO CP at time of call then end call with telling Pt to call back or call 911 if Chest pain returns prior to return call from triage team.   She does not want to go back to [facility]

## 2023-11-04 NOTE — Telephone Encounter (Signed)
 Called the patient and informed her of Dr. Hulen Shouts recommendation below:  "Go to ED"  Patient verbalized understanding and stated that she would go to the ER in afternoon when her son was able to take her. I encouraged her to go to the ER as soon as possible.Patient had no further questions at this time.

## 2023-11-04 NOTE — Telephone Encounter (Signed)
 Called patient and she reported that she was not feeling good. She had SOB last night and she had to use her inhaler because she was wheezing so bad. Her inhaler helped and she is still SOB but not as bad.She states she has a pulmonary appointment on 12/11/23. She also stated that she feels like she has a knot in the middle of her chest and she has pain in her left upper chest which goes to her shoulder which has been happening for the past year.She did take a nitroglycerin and the left upper chest pain did go away.  She is also light headed and dizzy and uses a cane to walk. Her latest blood pressures are 128/60 HR 59, 125/66 HR 60 and 149/68 HR 72. She states that she "has been to the ER before for all of these issues and they tell her she is fine" per the patient.

## 2023-11-04 NOTE — Telephone Encounter (Signed)
 Left message for the patient to call back.

## 2023-11-04 NOTE — ED Triage Notes (Signed)
 Pt c/o CP x 1 yr that she has been told is d/t arthritis; CP worse over the last 2 wks; also reports [facility]; sts she was told 2 wks ago she has a mass in her chest  [facility]); pain radiates to bil shoulder and neck; had relief with NTG x 1 last night

## 2023-11-10 ENCOUNTER — Ambulatory Visit: Payer: PPO

## 2023-11-10 DIAGNOSIS — J449 Chronic obstructive pulmonary disease, unspecified: Secondary | ICD-10-CM | POA: Diagnosis not present

## 2023-11-10 DIAGNOSIS — I350 Nonrheumatic aortic (valve) stenosis: Secondary | ICD-10-CM | POA: Diagnosis not present

## 2023-11-10 DIAGNOSIS — Z122 Encounter for screening for malignant neoplasm of respiratory organs: Secondary | ICD-10-CM | POA: Diagnosis not present

## 2023-11-10 DIAGNOSIS — R0609 Other forms of dyspnea: Secondary | ICD-10-CM | POA: Diagnosis not present

## 2023-11-10 DIAGNOSIS — Z1330 Encounter for screening examination for mental health and behavioral disorders, unspecified: Secondary | ICD-10-CM | POA: Diagnosis not present

## 2023-11-10 DIAGNOSIS — Z2839 Other underimmunization status: Secondary | ICD-10-CM | POA: Diagnosis not present

## 2023-11-10 LAB — ECHOCARDIOGRAM COMPLETE
AR max vel: 1.3 cm2
AV Area VTI: 1.33 cm2
AV Area mean vel: 1.53 cm2
AV Mean grad: 17.4 mmHg
AV Peak grad: 31.9 mmHg
Ao pk vel: 2.82 m/s
Area-P 1/2: 2.76 cm2
P 1/2 time: 359 ms
S' Lateral: 1.5 cm

## 2023-11-10 MED ORDER — PERFLUTREN LIPID MICROSPHERE
1.0000 mL | INTRAVENOUS | Status: AC | PRN
Start: 2023-11-10 — End: 2023-11-10
  Administered 2023-11-10: 10 mL via INTRAVENOUS

## 2023-11-10 NOTE — Progress Notes (Signed)
 Please inform her echocardiogram results once again show pumping function of the heart is normal.  There is significantly thickened heart muscle with increased stiffness. The aortic valve dysfunction with mildly leaky and narrow appears to not have breast significantly.  I can discuss this further with her in person at next follow-up visit. Please schedule her for a visit with me next available thank you

## 2023-11-11 ENCOUNTER — Telehealth: Payer: Self-pay | Admitting: Emergency Medicine

## 2023-11-11 NOTE — Telephone Encounter (Signed)
-----   Message from [place] R Madireddy sent at 11/10/2023  5:23 PM EST ----- Please inform her echocardiogram results once again show pumping function of the heart is normal.  There is significantly thickened heart muscle with increased stiffness. The aortic valve dysfunction with mildly leaky and narrow appears to not have breast significantly.  I can discuss this further with her in person at next follow-up visit. Please schedule her for a visit with me next available thank you

## 2023-11-11 NOTE — Telephone Encounter (Signed)
 Voicemail full.

## 2023-11-12 NOTE — Telephone Encounter (Signed)
 Spoke to patient and reviewed result note as per Dr. Madireddy's note. Patient verbalized understanding and had no further questions. Appointment scheduled.

## 2023-11-16 ENCOUNTER — Ambulatory Visit

## 2023-11-16 VITALS — BP 160/82 | HR 92 | Resp 97 | Ht 63.0 in | Wt 156.4 lb

## 2023-11-16 DIAGNOSIS — I2510 Atherosclerotic heart disease of native coronary artery without angina pectoris: Secondary | ICD-10-CM | POA: Diagnosis not present

## 2023-11-16 DIAGNOSIS — I10 Essential (primary) hypertension: Secondary | ICD-10-CM | POA: Diagnosis not present

## 2023-11-16 DIAGNOSIS — R42 Dizziness and giddiness: Secondary | ICD-10-CM

## 2023-11-16 DIAGNOSIS — I421 Obstructive hypertrophic cardiomyopathy: Secondary | ICD-10-CM | POA: Insufficient documentation

## 2023-11-16 HISTORY — DX: Obstructive hypertrophic cardiomyopathy: I42.1

## 2023-11-16 HISTORY — DX: Dizziness and giddiness: R42

## 2023-11-16 MED ORDER — FUROSEMIDE 20 MG PO TABS: 20.0000 mg | ORAL_TABLET | ORAL | 0 refills | Status: DC | PRN

## 2023-11-16 MED ORDER — FUROSEMIDE 40 MG PO TABS: 20.0000 mg | ORAL_TABLET | ORAL | 1 refills | Status: DC | PRN

## 2023-11-16 MED ORDER — METOPROLOL SUCCINATE ER 50 MG PO TB24: 50.0000 mg | ORAL_TABLET | Freq: Every day | ORAL | 3 refills | Status: AC

## 2023-11-16 MED ORDER — VERAPAMIL HCL 40 MG PO TABS: 40.0000 mg | ORAL_TABLET | Freq: Two times a day (BID) | ORAL | 1 refills | Status: DC

## 2023-11-16 NOTE — Assessment & Plan Note (Signed)
 Associated with postural changes and on standing for extended time. Appears related to dehydration and underlying hypertrophic cardiomyopathy. Workup recommend giving her supple hydrated and given precautions changing position and avoid standing for extended duration.  Heart monitor and treadmill EKG stress test is being requested to assess for any arrhythmias

## 2023-11-16 NOTE — Progress Notes (Signed)
 Cardiology Consultation:    Date:  11/16/2023   ID:  Sheila [name], DOB [birth year], MRN [id number]  PCP:  Sheila Auer, MD  Cardiologist:  Sheila Corporal Emelio Schneller, MD   Referring MD: Sheila Auer, MD   Chief Complaint  Patient presents with   Results          ASSESSMENT AND PLAN:   [name] 83 year old woman  history of mild nonobstructive coronary artery disease (based on prior cardiac catheterization 2019, last ischemic workup with Lexiscan stress test 2024, CT coronary angiogram April 2024 showed calcium score 96 with CAD RADS 2 study), mild to moderate aortic stenosis, mild aortic insufficiency, hypertension, hyperlipidemia,  severe asymmetric LVH with normal LV function, resting gradient across the LV mid-cavity gradient 29 mmHg on echocardiogram March 2025, no significant family history of hypertrophic cardiomyopathy or sudden cardiac death Problem List Items Addressed This Visit     Coronary artery disease involving native coronary artery of native heart without angina pectoris - Primary    prior cardiac catheterization 2019, last ischemic workup with Lexiscan stress test 2024, CT coronary angiogram April 2024 showed calcium score 96 with CAD RADS 2 study.  She does report chest pain which has been atypical and going on for a long time. Discussed how her symptoms could still be of cardiac origin in the setting of hypertrophic cardiomyopathy secondary to demand ischemia in the hypertrophied segments or secondary to any paroxysmal tachyarrhythmias.  Continue with aspirin 81 mg once daily. Continue rosuvastatin 40 mg once daily.  Zio patch heart monitor to rule out any tachyarrhythmias has been considered in the hypertrophic cardiomyopathy. Beta-blocker and verapamil being started as below.       Relevant Medications   metoprolol succinate (TOPROL-XL) 50 MG 24 hr tablet   verapamil (CALAN) 40 MG tablet   furosemide (LASIX) 20 MG tablet   Other Relevant Orders    Exercise Tolerance Test   Essential hypertension   Suboptimal control. Med changes with carvedilol switched to metoprolol and addition of verapamil as below. Target blood pressure below 130/80 mmHg.      Relevant Medications   metoprolol succinate (TOPROL-XL) 50 MG 24 hr tablet   verapamil (CALAN) 40 MG tablet   furosemide (LASIX) 20 MG tablet   Obstructive hypertrophic cardiomyopathy (HCC)   -Discussed the findings at length with her and how it most likely suggests a condition called hypertrophic cardiomyopathy. She does not have any significant family history of sudden cardiac death or known history of hypertrophic cardiomyopathy. She does not report any history of syncope or near syncope. She does report lightheadedness which appears to be change in position or standing for an extended time.  Ideally will need cardiac MRI for further characterization of LV anatomy.  However with her ongoing symptoms that she describes as atypical chest pain and shortness of breath due to COPD, will hold off on cardiac MRI until further workup is completed as below.  heart monitor for 14 days  treadmill exercise stress test to assess her functional capacity and any inducible arrhythmias. If she does have any significant ventricular arrhythmias we will request expedite cardiac MRI and request electrophysiologist appointment and refer to HOCM clinic for further discussion.    Discussed the diagnosis at length.  Discussed how this could be hereditary versus can occur spontaneously.  Advised her to avoid getting dehydrated. Recommended she consume plenty of fluids, preferably water throughout the day. Advised her to take precaution and avoid sudden change  in posture and avoid standing for extended duration.  Discontinue daily furosemide and use furosemide 20 mg as needed for weight gain over 2 to 3 pounds within a day or if she notices any pedal edema. Discontinue carvedilol [given her history of COPD and  now with HOCM]. Will switch her to metoprolol succinate 50 mg once daily. Will add verapamil 40 mg twice daily       Relevant Medications   metoprolol succinate (TOPROL-XL) 50 MG 24 hr tablet   verapamil (CALAN) 40 MG tablet   furosemide (LASIX) 20 MG tablet   Other Relevant Orders   Exercise Tolerance Test   Lightheadedness   Associated with postural changes and on standing for extended time. Appears related to dehydration and underlying hypertrophic cardiomyopathy. Workup recommend giving her supple hydrated and given precautions changing position and avoid standing for extended duration.  Heart monitor and treadmill EKG stress test is being requested to assess for any arrhythmias        Relevant Orders   LONG TERM MONITOR (3-14 DAYS)   Exercise Tolerance Test      History of Present Illness:    Sheila [name] is a 83 y.o. female who is being seen today forFor follow-up visit. PCP is Sheila Auer, MD. Last visit with me in the office was 07/13/2023.  Here for follow visit today accompanied by her friend.  Has history of mild nonobstructive coronary artery disease (based on prior cardiac catheterization 2019, last ischemic workup with Lexiscan stress test 2024, CT coronary angiogram April 2024 showed calcium score 96 with CAD RADS 2 study), mild to moderate aortic stenosis, mild aortic insufficiency, hypertension, hyperlipidemia,  severe asymmetric LVH with normal LV function, resting gradient across the LV mid-cavity gradient 29 mmHg on echocardiogram March 2025, no significant family history of hypertrophic cardiomyopathy or sudden cardiac death.  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  CMP was unremarkable.  Echocardiogram from November 10, 2023 noted severe asymmetric left  ventricular hypertrophy once again noted with resting gradient across the mid cavity 29 mmHg. , LVEF 65 to 70%, mild aortic insufficiency, mild to moderate aortic stenosis, aortic valve area 1.33 cm, V-max 2.8 m/s, mildly dilated left atrium, normal RV size and function,  This was not assessed with Valsalva.  EKG in the ER recently 11-04-2023 consistent to prior EKGs with sinus rhythm heart rate 65/min, LVH morphology QRS with marked repolarization changes with T wave inversions.  At follow-up visit today accompanied by her friend.  She mentions she does continue to have chest pain which she describes as present frequently.  Has been a chronic complaint.  Has been using Imdur 30 mg once daily for years.  Uses nitroglycerin sublingual as needed.  She also describes symptoms of lightheadedness which are worse with standing up and walking, describes this as a sensation different from dizziness.  She denies any syncopal episodes.No pedal edema.  No orthopnea..  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  She has been using inhalers and prescriptions as given to her.  She does consume green tea and ginger tea multiple times a day. She is mindful about avoiding salt in her diet.  Past Medical History:  Diagnosis Date   Allergy    Anxiety    Arthritis  Asthma    Blood transfusion without reported diagnosis    Cataract    Chest pain 07/02/2018   Chronic headaches    Chronic infective otitis externa, 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   Chronic obstructive asthma (HCC) 05/18/2020   Coronary artery disease involving native coronary artery of native heart without angina pectoris 10/10/2015   Depression    Dyslipidemia 10/10/2015   Dyspnea 11/21/2019   Essential hypertension 08/28/2016   Eustachian tube dysfunction, right 11/28/2022   GERD (gastroesophageal reflux disease)    Glaucoma    Headache above the eye region 11/10/2017   Heart murmur    History of right mastoidectomy  06/29/2020   Hyperlipemia 11/28/2022   Hyperlipidemia    Hypertension    Imbalance 04/17/2022   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    EYE SURGERY     LEFT HEART CATH AND CORONARY ANGIOGRAPHY N/A 12/28/2017   Procedure: LEFT HEART CATH AND CORONARY ANGIOGRAPHY;  Surgeon: Lyn Records, MD;  Location: [facility];  Service: Cardiovascular;  Laterality: N/A;   MASTOIDECTOMY     PATENT DUCTUS ARTERIOUS REPAIR     SHOULDER OPEN ROTATOR CUFF REPAIR Bilateral    UPPER GASTROINTESTINAL ENDOSCOPY  10/15/2021    Current Medications: Current Meds  Medication Sig   albuterol (VENTOLIN HFA) 108 (90 Base) MCG/ACT inhaler Inhale 2 puffs into the lungs every 6 (six) hours as needed for wheezing or shortness of breath.   ALPRAZolam (XANAX) 0.25 MG tablet Take 0.25 mg by mouth 2 (two) times daily as needed for anxiety.   aspirin EC 81 MG tablet Take 81 mg by mouth daily.   celecoxib (CELEBREX) 100 MG capsule Take 1 capsule (100 mg total) by mouth 2 (two) times daily.   Coenzyme Q10 (CO Q-10) 200 MG CAPS Take 1 capsule by mouth daily.   dicyclomine (BENTYL) 20 MG tablet Take 20 mg by mouth every 8 (eight) hours as needed.   dorzolamide (TRUSOPT) 2 % ophthalmic solution Place 1 drop into both eyes 2 (two) times daily.   dorzolamide-timolol (COSOPT) 2-0.5 % ophthalmic solution Place 1 drop into both eyes 2 (two) times daily.   FLOVENT HFA 110 MCG/ACT inhaler Inhale 1 puff into the lungs 2 (two) times  daily.   fluconazole (DIFLUCAN) 100 MG tablet Take 2 tablets for 1 day and then 1 tablet for the next 14 days (Patient taking differently: Take 100 mg by mouth See admin instructions. Take 2 tablets for 1 day and then 1 tablet for the next 14 days)   fluticasone (FLONASE) 50 MCG/ACT nasal spray Place 2 sprays into both nostrils daily.   hydrALAZINE (APRESOLINE) 25 MG tablet Take 12.5 mg by mouth as needed (high blood pressure).   isosorbide mononitrate (IMDUR) 30 MG 24 hr tablet Take 1 tablet (30 mg total) by mouth daily.   latanoprost (XALATAN) 0.005 % ophthalmic solution Place 1 drop into both eyes at bedtime.   linaclotide (LINZESS) 72 MCG capsule Take 72 mcg by mouth daily.   metoprolol succinate (  TOPROL-XL) 50 MG 24 hr tablet Take 1 tablet (50 mg total) by mouth daily. Take with or immediately following a meal.   Multiple Vitamin (MULTIVITAMIN) tablet Take 1 tablet by mouth daily.   nitroGLYCERIN (NITROSTAT) 0.4 MG SL tablet Place 1 tablet (0.4 mg total) under the tongue every 5 (five) minutes as needed for chest pain.   omeprazole (PRILOSEC) 40 MG capsule Take 40 mg by mouth at bedtime.   potassium chloride (MICRO-K) 10 MEQ CR capsule Take 10 mEq by mouth every morning.   prednisoLONE acetate (PRED FORTE) 1 % ophthalmic suspension Place 1 drop into both eyes 4 (four) times daily.   rosuvastatin (CRESTOR) 40 MG tablet TAKE 1 TABLET ON MONDAY, WEDNESDAY AND FRIDAY ONLY (Patient taking differently: Take 40 mg by mouth 3 (three) times a week. Monday, Wednesdays and Friday)   sucralfate (CARAFATE) 1 g tablet Take 1 g by mouth 3 (three) times daily as needed (GI).   timolol (TIMOPTIC) 0.5 % ophthalmic solution Place 1 drop into both eyes 2 (two) times daily.   Tiotropium Bromide-Olodaterol (STIOLTO RESPIMAT) 2.5-2.5 MCG/ACT AERS Inhale 2 puffs into the lungs daily.   traMADol-acetaminophen (ULTRACET) 37.5-325 MG tablet Take 1-2 tablets by mouth 2 (two) times daily as needed for pain.   verapamil  (CALAN) 40 MG tablet Take 1 tablet (40 mg total) by mouth 2 (two) times daily.   VOQUEZNA 20 MG TABS Take 1 tablet by mouth daily.   [DISCONTINUED] carvedilol (COREG) 6.25 MG tablet Take 6.25 mg by mouth 2 (two) times daily with a meal.   [DISCONTINUED] furosemide (LASIX) 40 MG tablet Take 20 mg by mouth daily.     Allergies:   Antihistamines, chlorpheniramine-type; Ceftin [cefuroxime axetil]; Pheneen [benzalkonium chloride]; Ativan [lorazepam]; Meloxicam; and Morphine   Social History   Socioeconomic History   Marital status: Divorced    Spouse name: Not on file   Number of children: 5   Years of education: Not on file   Highest education level: Not on file  Occupational History   Occupation: Retired   Tobacco Use   Smoking status: Never   Smokeless tobacco: Never  Vaping Use   Vaping status: Never Used  Substance and Sexual Activity   Alcohol use: No   Drug use: Never   Sexual activity: Not Currently    Birth control/protection: Post-menopausal  Other Topics Concern   Not on file  Social History Narrative   Not on file   Social Drivers of Health   Financial Resource Strain: Low Risk  (01/29/2023)   Received from [facility] [employer], [facility] and [facility]   Overall Financial Resource Strain (CARDIA)    Difficulty of Paying Living Expenses: Not hard at all  Food Insecurity: Low Risk  (07/09/2023)   Received from [facility]   Hunger Vital Sign    Worried About Running Out of Food in the Last Year: Never true    Ran Out of Food in the Last Year: Never true  Transportation Needs: No Transportation Needs (07/09/2023)   Received from [facility]    In the past 12 months, has lack of reliable transportation kept you from medical appointments, meetings, work or from getting things needed for daily living? : No  Physical Activity: Not on file  Stress: Not on file  Social Connections: Not on file     Family  History: The patient's family history includes Colon cancer in her brother; Diabetes in her brother  and daughter; Heart attack in her mother and sister; Stomach cancer in her sister. There is no history of Esophageal cancer, Colon polyps, or Rectal cancer. ROS:   Please see the history of present illness.    All 14 point review of systems negative except as described per history of present illness.  EKGs/Labs/Other Studies Reviewed:    The following studies were reviewed today:   EKG:       Recent Labs: 11/04/2023: ALT 15; BUN 13; Creatinine, Ser 0.48; Hemoglobin 13.5; Platelets 197; Potassium 3.8; Sodium 136  Recent Lipid Panel    Component Value Date/Time   CHOL 169 03/17/2023 1028   TRIG 88 03/17/2023 1028   HDL 55 03/17/2023 1028   CHOLHDL 3.1 03/17/2023 1028   LDLCALC 98 03/17/2023 1028    Physical Exam:    VS:  BP (!) 160/82 (BP Location: Left Arm, Patient Position: Sitting)   Pulse 92   Resp (!) 97   Ht 5\' 3"  (1.6 m)   Wt 156 lb 6.4 oz (70.9 kg)   BMI 27.71 kg/m     Wt Readings from Last 3 Encounters:  11/16/23 156 lb 6.4 oz (70.9 kg)  11/04/23 150 lb (68 kg)  09/11/23 157 lb (71.2 kg)     GENERAL:  Well nourished, well developed in no acute distress NECK: No JVD; No carotid bruits CARDIAC: RRR, S1 and S2 present, 3/6 harsh systolic murmur best heard left parasternal head increases with breath-holding and bearing down CHEST:  Clear to auscultation without rales, wheezing or rhonchi  Extremities: No pitting pedal edema. Pulses bilaterally symmetric with radial 2+ and dorsalis pedis 2+ NEUROLOGIC:  Alert and oriented x 3  Medication Adjustments/Labs and Tests Ordered: Current medicines are reviewed at length with the patient today.  Concerns regarding medicines are outlined above.  Orders Placed This Encounter  Procedures   LONG TERM MONITOR (3-14 DAYS)   Exercise Tolerance Test   Meds ordered this encounter  Medications   metoprolol succinate (TOPROL-XL)  50 MG 24 hr tablet    Sig: Take 1 tablet (50 mg total) by mouth daily. Take with or immediately following a meal.    Dispense:  90 tablet    Refill:  3   verapamil (CALAN) 40 MG tablet    Sig: Take 1 tablet (40 mg total) by mouth 2 (two) times daily.    Dispense:  180 tablet    Refill:  1   DISCONTD: furosemide (LASIX) 40 MG tablet    Sig: Take 0.5 tablets (20 mg total) by mouth as needed for edema or fluid.    Dispense:  60 tablet    Refill:  1   furosemide (LASIX) 20 MG tablet    Sig: Take 1 tablet (20 mg total) by mouth as needed for edema or fluid.    Dispense:  30 tablet    Refill:  0    Signed, Chrystian Cupples reddy Kamarie Veno, MD, MPH, [facility]. 11/16/2023 3:39 PM    [facility]

## 2023-11-16 NOTE — Assessment & Plan Note (Signed)
 Suboptimal control. Med changes with carvedilol switched to metoprolol and addition of verapamil as below. Target blood pressure below 130/80 mmHg.

## 2023-11-16 NOTE — Assessment & Plan Note (Signed)
 prior cardiac catheterization 2019, last ischemic workup with Lexiscan stress test 2024, CT coronary angiogram April 2024 showed calcium score 96 with CAD RADS 2 study.  She does report chest pain which has been atypical and going on for a long time. Discussed how her symptoms could still be of cardiac origin in the setting of hypertrophic cardiomyopathy secondary to demand ischemia in the hypertrophied segments or secondary to any paroxysmal tachyarrhythmias.  Continue with aspirin 81 mg once daily. Continue rosuvastatin 40 mg once daily.  Zio patch heart monitor to rule out any tachyarrhythmias has been considered in the hypertrophic cardiomyopathy. Beta-blocker and verapamil being started as below.

## 2023-11-16 NOTE — Assessment & Plan Note (Addendum)
-  Discussed the findings at length with her and how it most likely suggests a condition called hypertrophic cardiomyopathy. She does not have any significant family history of sudden cardiac death or known history of hypertrophic cardiomyopathy. She does not report any history of syncope or near syncope. She does report lightheadedness which appears to be change in position or standing for an extended time.  Ideally will need cardiac MRI for further characterization of LV anatomy.  However with her ongoing symptoms that she describes as atypical chest pain and shortness of breath due to COPD, will hold off on cardiac MRI until further workup is completed as below.  heart monitor for 14 days  treadmill exercise stress test to assess her functional capacity and any inducible arrhythmias. If she does have any significant ventricular arrhythmias we will request expedite cardiac MRI and request electrophysiologist appointment and refer to HOCM clinic for further discussion.    Discussed the diagnosis at length.  Discussed how this could be hereditary versus can occur spontaneously.  Advised her to avoid getting dehydrated. Recommended she consume plenty of fluids, preferably water throughout the day. Advised her to take precaution and avoid sudden change in posture and avoid standing for extended duration.  Discontinue daily furosemide and use furosemide 20 mg as needed for weight gain over 2 to 3 pounds within a day or if she notices any pedal edema. Discontinue carvedilol [given her history of COPD and now with HOCM]. Will switch her to metoprolol succinate 50 mg once daily. Will add verapamil 40 mg twice daily

## 2023-11-16 NOTE — Patient Instructions (Signed)
 Medication Instructions:    Stop Carvedilol.   Start Verapamil 40 mg twice a day.   Stop Lasix. Take only once a day as needed for weight gain of 2-3 pounds in a day, or for swelling.   Start Metoprolol  XL 50 mg once a day.  *If you need a refill on your cardiac medications before your next appointment, please call your pharmacy*   Lab Work: None Ordered If you have labs (blood work) drawn today and your tests are completely normal, you will receive your results only by: MyChart Message (if you have MyChart) OR A paper copy in the mail If you have any lab test that is abnormal or we need to change your treatment, we will call you to review the results.   Testing/Procedures: Treadmill Stress Test Instructions:    1. You may take all of your medications except Metoprolol.   2. No food, drink or tobacco products 2 hours prior to your test.  3. Dress prepared to exercise. Best to wear 2 piece outfit and tennis shoes. Shoes must be closed toe.  4. Please bring all current prescription medications.    Follow-Up: At [facility], you and your health needs are our priority.  As part of our continuing mission to provide you with exceptional heart care, we have created designated Provider Care Teams.  These Care Teams include your primary Cardiologist (physician) and Advanced Practice Providers (APPs -  Physician Assistants and Nurse Practitioners) who all work together to provide you with the care you need, when you need it.  We recommend signing up for the patient portal called "MyChart".  Sign up information is provided on this After Visit Summary.  MyChart is used to connect with patients for Virtual Visits (Telemedicine).  Patients are able to view lab/test results, encounter notes, upcoming appointments, etc.  Non-urgent messages can be sent to your provider as well.   To learn more about what you can do with MyChart, go to [email or web address].    Your next appointment:    2 month follow up

## 2023-11-17 ENCOUNTER — Telehealth: Payer: Self-pay

## 2023-11-17 NOTE — Telephone Encounter (Signed)
 Attempted to contact the patient, all numbers and voicemail were not set up. S.Aesha Agrawal CCT

## 2023-11-18 ENCOUNTER — Ambulatory Visit

## 2023-11-18 DIAGNOSIS — I421 Obstructive hypertrophic cardiomyopathy: Secondary | ICD-10-CM

## 2023-11-18 DIAGNOSIS — R42 Dizziness and giddiness: Secondary | ICD-10-CM

## 2023-11-18 DIAGNOSIS — I2510 Atherosclerotic heart disease of native coronary artery without angina pectoris: Secondary | ICD-10-CM | POA: Diagnosis not present

## 2023-11-19 LAB — EXERCISE TOLERANCE TEST
Estimated workload: 4.6
Exercise duration (min): 3 min
Exercise duration (sec): 2 s
MPHR: 138 {beats}/min
Peak HR: 108 {beats}/min
Percent HR: 78 %
Rest HR: 75 {beats}/min

## 2023-12-04 DIAGNOSIS — R42 Dizziness and giddiness: Secondary | ICD-10-CM | POA: Diagnosis not present

## 2023-12-08 ENCOUNTER — Other Ambulatory Visit: Payer: Self-pay

## 2023-12-09 DIAGNOSIS — H402232 Chronic angle-closure glaucoma, bilateral, moderate stage: Secondary | ICD-10-CM | POA: Diagnosis not present

## 2023-12-10 DIAGNOSIS — J449 Chronic obstructive pulmonary disease, unspecified: Secondary | ICD-10-CM | POA: Diagnosis not present

## 2023-12-10 DIAGNOSIS — I421 Obstructive hypertrophic cardiomyopathy: Secondary | ICD-10-CM | POA: Diagnosis not present

## 2024-01-05 DIAGNOSIS — R0609 Other forms of dyspnea: Secondary | ICD-10-CM | POA: Diagnosis not present

## 2024-01-05 DIAGNOSIS — Z122 Encounter for screening for malignant neoplasm of respiratory organs: Secondary | ICD-10-CM | POA: Diagnosis not present

## 2024-01-05 DIAGNOSIS — J449 Chronic obstructive pulmonary disease, unspecified: Secondary | ICD-10-CM | POA: Diagnosis not present

## 2024-01-05 DIAGNOSIS — Z2839 Other underimmunization status: Secondary | ICD-10-CM | POA: Diagnosis not present

## 2024-01-13 DIAGNOSIS — Z1339 Encounter for screening examination for other mental health and behavioral disorders: Secondary | ICD-10-CM | POA: Diagnosis not present

## 2024-01-13 DIAGNOSIS — Z9181 History of falling: Secondary | ICD-10-CM | POA: Diagnosis not present

## 2024-01-13 DIAGNOSIS — Z6827 Body mass index (BMI) 27.0-27.9, adult: Secondary | ICD-10-CM | POA: Diagnosis not present

## 2024-01-13 DIAGNOSIS — E041 Nontoxic single thyroid nodule: Secondary | ICD-10-CM | POA: Diagnosis not present

## 2024-01-13 DIAGNOSIS — Z0000 Encounter for general adult medical examination without abnormal findings: Secondary | ICD-10-CM | POA: Diagnosis not present

## 2024-01-13 DIAGNOSIS — Z1331 Encounter for screening for depression: Secondary | ICD-10-CM | POA: Diagnosis not present

## 2024-01-14 DIAGNOSIS — R2689 Other abnormalities of gait and mobility: Secondary | ICD-10-CM | POA: Diagnosis not present

## 2024-01-14 DIAGNOSIS — H6121 Impacted cerumen, right ear: Secondary | ICD-10-CM | POA: Diagnosis not present

## 2024-01-14 DIAGNOSIS — H60391 Other infective otitis externa, right ear: Secondary | ICD-10-CM | POA: Diagnosis not present

## 2024-01-14 DIAGNOSIS — M26609 Unspecified temporomandibular joint disorder, unspecified side: Secondary | ICD-10-CM | POA: Diagnosis not present

## 2024-01-14 DIAGNOSIS — H6991 Unspecified Eustachian tube disorder, right ear: Secondary | ICD-10-CM | POA: Diagnosis not present

## 2024-01-14 DIAGNOSIS — H90A31 Mixed conductive and sensorineural hearing loss, unilateral, right ear with restricted hearing on the contralateral side: Secondary | ICD-10-CM | POA: Diagnosis not present

## 2024-01-14 DIAGNOSIS — Z9889 Other specified postprocedural states: Secondary | ICD-10-CM | POA: Diagnosis not present

## 2024-01-14 DIAGNOSIS — R5383 Other fatigue: Secondary | ICD-10-CM | POA: Diagnosis not present

## 2024-01-14 DIAGNOSIS — H90A22 Sensorineural hearing loss, unilateral, left ear, with restricted hearing on the contralateral side: Secondary | ICD-10-CM | POA: Diagnosis not present

## 2024-01-15 ENCOUNTER — Other Ambulatory Visit: Payer: Self-pay

## 2024-01-18 ENCOUNTER — Ambulatory Visit

## 2024-01-18 VITALS — BP 126/60 | HR 76 | Ht 62.6 in | Wt 156.2 lb

## 2024-01-18 DIAGNOSIS — I421 Obstructive hypertrophic cardiomyopathy: Secondary | ICD-10-CM | POA: Diagnosis not present

## 2024-01-18 DIAGNOSIS — I2510 Atherosclerotic heart disease of native coronary artery without angina pectoris: Secondary | ICD-10-CM

## 2024-01-18 NOTE — Progress Notes (Signed)
 Cardiology Consultation:    Date:  01/18/2024   ID:  Sheila [name], DOB [birth year], MRN [id number]  PCP:  Beecher Bower, MD  Cardiologist:  Daymon Evans Emmauel Hallums, MD   Referring MD: Beecher Bower, MD   No chief complaint on file.    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  Problem List Items Addressed This Visit     Coronary artery disease involving native coronary artery of native heart without angina pectoris - Primary   prior cardiac catheterization 2019, last ischemic workup with Lexiscan  stress test 2024, CT coronary angiogram April 2024 showed calcium  score 96 with CAD RADS 2 study.  Remains asymptomatic. Treadmill EKG stress test March 2025 submaximal heart rate 78% MPHR, no no significant symptoms of chest pain.  4.6 METS attained.  Continue with aspirin  81 mg once daily.      Relevant Medications   furosemide  (LASIX ) 40 MG tablet   Obstructive hypertrophic cardiomyopathy (HCC)   Reviewed findings and results from workup thus far.  She has been doing well and tolerating verapamil  40 mg twice daily and metoprolol  succinate 50 mg once daily.  Will discontinue Imdur  as previously discussed. Advised her to keep herself well-hydrated. Advised to avoid salt keep it below less than 2 g/day.  Will schedule for cardiac MRI for further assessment.       Relevant Medications    furosemide  (LASIX ) 40 MG tablet   Return to clinic for follow-up tentatively in 3 months   History of Present Illness:    Sheila [name] is a 83 y.o. female who is being seen today for follow-up visit PCP is Beecher Bower, MD. Last visit with us  in the office was 11-16-2023.  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  At last office visit given symptoms of chest pain and suspicion for hypertrophic cardiomyopathy Zio patch, treadmill EKG stress test was requested and started on verapamil  and switched to metoprolol  from carvedilol.  Loop diuretic was discontinued and recommended to use it only on an as-needed basis for weight gain.  Treadmill stress test completed 11-18-2023 walked 3 minutes and 2 seconds on the treadmill attaining 78% MPHR, 4.6 METS stopped due to fatigue and shortness of breath.  No EKG changes to suggest ischemia however baseline ST-T changes limit assessment for ischemia.  There were no triggered arrhythmias.  There was an isolated PVC noted.  14-day event monitor from 11-16-2023 noted predominantly sinus rhythm with average heart rate 62/min [  ranging from 45 bpm to 114 bpm].  Rare ventricular and supraventricular ectopy burden less than 1%.  3 short runs of nonsustained ventricular tachycardia noted with the longest episode lasting 16 beats, asymptomatic occurred during sleeping hours around 5:01 AM.  5 short runs of supraventricular  tachycardia noted with the longest episode lasting 10 beats.  Total patient triggered events and diary entries 8 correlated with sinus rhythm between 59 to 76 bpm without any abnormalities.  Reassuring results without any major abnormalities.  Here for the visit today.  Overall from cardiac standpoint her major symptom tends to be lightheadedness occasionally when changing position.  Denies any syncopal episode, falls.  Denies any symptoms of chest pain at rest.  With treadmill exercise recently she felt short of breath but denies any chest pain.  Good compliance with medications. Has not had to take any furosemide .   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   Coronary artery disease involving native coronary artery of native heart without angina pectoris 10/10/2015   Depression    Dyslipidemia 10/10/2015   Dyspnea 11/21/2019   Essential hypertension 08/28/2016   Eustachian tube dysfunction, right 11/28/2022   GERD (gastroesophageal reflux disease)    Glaucoma    Headache above the eye region 11/10/2017   Heart murmur    History of right mastoidectomy 06/29/2020   Hyperlipemia 11/28/2022   Hyperlipidemia    Hypertension    Imbalance 04/17/2022   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  Similar morphology on prior echocardiograms without significant gradients reported.  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    EYE SURGERY     LEFT HEART CATH AND CORONARY ANGIOGRAPHY N/A 12/28/2017   Procedure: LEFT HEART CATH AND CORONARY ANGIOGRAPHY;  Surgeon: Arty Binning, MD;  Location: [facility];  Service: Cardiovascular;  Laterality: N/A;   MASTOIDECTOMY     PATENT DUCTUS ARTERIOUS REPAIR     SHOULDER OPEN ROTATOR CUFF REPAIR Bilateral    UPPER GASTROINTESTINAL ENDOSCOPY  10/15/2021    Current Medications: Current Meds  Medication Sig   albuterol  (VENTOLIN  HFA) 108 (90 Base) MCG/ACT inhaler Inhale 2 puffs into the lungs every 6 (six) hours as needed for wheezing or shortness of breath.   ALPRAZolam (XANAX) 0.25 MG  tablet Take 0.25 mg by mouth 2 (two) times daily as needed for anxiety.   aspirin  EC 81 MG tablet Take 81 mg by mouth daily.   Coenzyme Q10 (CO Q-10) 200 MG CAPS Take 1 capsule by mouth daily.   dicyclomine (BENTYL) 20 MG tablet Take 20 mg by mouth every 8 (eight) hours as needed for spasms.   dorzolamide (TRUSOPT) 2 % ophthalmic solution Place 1 drop into both eyes 2 (two) times daily.   dorzolamide-timolol (COSOPT) 2-0.5 % ophthalmic solution Place 1 drop into both eyes 2 (two) times daily.   FLOVENT HFA 110 MCG/ACT inhaler Inhale 1 puff into the lungs 2 (two) times daily.   fluticasone (FLONASE) 50 MCG/ACT nasal spray  Place 2 sprays into both nostrils daily.   furosemide  (LASIX ) 40 MG tablet Take 20 mg by mouth as needed for fluid or edema.   isosorbide  mononitrate (IMDUR ) 30 MG 24 hr tablet Take 1 tablet (30 mg total) by mouth daily.   latanoprost (XALATAN) 0.005 % ophthalmic solution Place 1 drop into both eyes at bedtime.   linaclotide (LINZESS) 72 MCG capsule Take 72 mcg by mouth daily.   metoprolol  succinate (TOPROL -XL) 50 MG 24 hr tablet Take 1 tablet (50 mg total) by mouth daily. Take with or immediately following a meal.   Multiple Vitamin (MULTIVITAMIN) tablet Take 1 tablet by mouth daily.   nitroGLYCERIN  (NITROSTAT ) 0.4 MG SL tablet Place 1 tablet (0.4 mg total) under the tongue every 5 (five) minutes as needed for chest pain.   potassium chloride (MICRO-K) 10 MEQ CR capsule Take 10 mEq by mouth every morning.   rosuvastatin  (CRESTOR ) 40 MG tablet Take 40 mg by mouth every Monday, Wednesday, and Friday.   sucralfate (CARAFATE) 1 g tablet Take 1 g by mouth 3 (three) times daily as needed (GI).   Tiotropium Bromide-Olodaterol (STIOLTO RESPIMAT) 2.5-2.5 MCG/ACT AERS Inhale 2 puffs into the lungs daily.   verapamil  (CALAN ) 40 MG tablet Take 1 tablet (40 mg total) by mouth 2 (two) times daily.   VOQUEZNA 20 MG TABS Take 1 tablet by mouth daily.     Allergies:   Antihistamines, chlorpheniramine-type; Ceftin [cefuroxime axetil]; Pheneen [benzalkonium chloride]; Ativan [lorazepam]; Meloxicam; and Morphine    Social History   Socioeconomic History   Marital status: Divorced    Spouse name: Not on file   Number of children: 5   Years of education: Not on file   Highest education level: Not on file  Occupational History   Occupation: Retired   Tobacco Use   Smoking status: Never   Smokeless tobacco: Never  Vaping Use   Vaping status: Never Used  Substance and Sexual Activity   Alcohol use: No   Drug use: Never   Sexual activity: Not Currently    Birth control/protection: Post-menopausal  Other  Topics Concern   Not on file  Social History Narrative   Not on file   Social Drivers of Health   Financial Resource Strain: Low Risk  (01/29/2023)   Received from [facility]   Overall Financial Resource Strain (CARDIA)    Difficulty of Paying Living Expenses: Not hard at all  Food Insecurity: Low Risk  (01/14/2024)   Received from [facility]   Hunger Vital Sign    Worried About Running Out of Food in the Last Year: Never true    Ran Out of Food in the Last Year: Never true  Transportation Needs: No Transportation  Needs (01/14/2024)   Received from [facility]    In the past 12 months, has lack of reliable transportation kept you from medical appointments, meetings, work or from getting things needed for daily living? : No  Physical Activity: Not on file  Stress: Not on file  Social Connections: Not on file     Family History: The patient's family history includes Colon cancer in her brother; Diabetes in her brother and daughter; Heart attack in her mother and sister; Stomach cancer in her sister. There is no history of Esophageal cancer, Colon polyps, or Rectal cancer. ROS:   Please see the history of present illness.    All 14 point review of systems negative except as described per history of present illness.  EKGs/Labs/Other Studies Reviewed:    The following studies were reviewed today:   EKG:       Recent Labs: 11/04/2023: ALT 15; BUN 13; Creatinine, Ser 0.48; Hemoglobin 13.5; Platelets 197; Potassium 3.8; Sodium 136  Recent Lipid Panel    Component Value Date/Time   CHOL 169 03/17/2023 1028   TRIG 88 03/17/2023 1028   HDL 55 03/17/2023 1028   CHOLHDL 3.1 03/17/2023 1028   LDLCALC 98 03/17/2023 1028    Physical Exam:    VS:  BP 126/60   Pulse 76   Ht 5' 2.6" (1.59 m)   Wt 156 lb 3.2 oz (70.9 kg)   SpO2 96%   BMI 28.02 kg/m     Wt Readings from Last 3 Encounters:   01/18/24 156 lb 3.2 oz (70.9 kg)  11/16/23 156 lb 6.4 oz (70.9 kg)  11/04/23 150 lb (68 kg)     GENERAL:  Well nourished, well developed in no acute distress NECK: No JVD; No carotid bruits CARDIAC: RRR, S1 and S2 present, 3/6 harsh ejection systolic murmur best heard left parasternal area. CHEST:  Clear to auscultation without rales, wheezing or rhonchi  Extremities: No pitting pedal edema. Pulses bilaterally symmetric with radial 2+ and dorsalis pedis 2+ NEUROLOGIC:  Alert and oriented x 3  Medication Adjustments/Labs and Tests Ordered: Current medicines are reviewed at length with the patient today.  Concerns regarding medicines are outlined above.  No orders of the defined types were placed in this encounter.  No orders of the defined types were placed in this encounter.   Signed, Krysti Hickling reddy Emidio Warrell, MD, MPH, [facility] 01/18/2024 12:03 PM    [facility]

## 2024-01-18 NOTE — Assessment & Plan Note (Signed)
 Reviewed findings and results from workup thus far.  She has been doing well and tolerating verapamil  40 mg twice daily and metoprolol  succinate 50 mg once daily.  Will discontinue Imdur  as previously discussed. Advised her to keep herself well-hydrated. Advised to avoid salt keep it below less than 2 g/day.  Will schedule for cardiac MRI for further assessment.

## 2024-01-18 NOTE — Assessment & Plan Note (Signed)
 prior cardiac catheterization 2019, last ischemic workup with Lexiscan  stress test 2024, CT coronary angiogram April 2024 showed calcium  score 96 with CAD RADS 2 study.  Remains asymptomatic. Treadmill EKG stress test March 2025 submaximal heart rate 78% MPHR, no no significant symptoms of chest pain.  4.6 METS attained.  Continue with aspirin  81 mg once daily.

## 2024-01-18 NOTE — Patient Instructions (Addendum)
 Medication Instructions:  Your physician has recommended you make the following change in your medication:  Stop Imdur   *If you need a refill on your cardiac medications before your next appointment, please call your pharmacy*  Lab Work: NONE If you have labs (blood work) drawn today and your tests are completely normal, you will receive your results only by: MyChart Message (if you have MyChart) OR A paper copy in the mail If you have any lab test that is abnormal or we need to change your treatment, we will call you to review the results.  Testing/Procedures:  You are scheduled for Cardiac MRI at the location below.  Please arrive for your appointment at ______________ . ?  [facility] [address] [place], [place] [address] Please take advantage of the free valet parking available at the [facility] and [employer] (Entrance C).  Proceed to the [facility] Radiology Department (First Floor) for check-in.    Magnetic resonance imaging (MRI) is a painless test that produces images of the inside of the body without using Xrays.  During an MRI, strong magnets and radio waves work together in a Data processing manager to form detailed images.   MRI images may provide more details about a medical condition than X-rays, CT scans, and ultrasounds can provide.  You may be given earphones to listen for instructions.  You may eat a light breakfast and take medications as ordered with the exception of furosemide , hydrochlorothiazide, chlorthalidone or spironolactone (or any other fluid pill). If you are undergoing a stress MRI, please avoid stimulants for 12 hr prior to test. (I.e. Caffeine, nicotine, chocolate, or antihistamine medications)  If your provider has ordered anti-anxiety medications for this test, then you will need a driver.  An IV will be inserted into one of your veins. Contrast material will be injected into your IV. It will leave your body through your urine within  a day. You may be told to drink plenty of fluids to help flush the contrast material out of your system.  You will be asked to remove all metal, including: Watch, jewelry, and other metal objects including hearing aids, hair pieces and dentures. Also wearable glucose monitoring systems (ie. Freestyle Libre and Omnipods) (Braces and fillings normally are not a problem.)   TEST WILL TAKE APPROXIMATELY 1 HOUR  PLEASE NOTIFY SCHEDULING AT LEAST 24 HOURS IN ADVANCE IF YOU ARE UNABLE TO KEEP YOUR APPOINTMENT. [phone]  For more information and frequently asked questions, please visit our website : [email or web address]  Please call the Cardiac Imaging Nurse Navigators with any questions/concerns. [phone] Office      Follow-Up: At [facility], you and your health needs are our priority.  As part of our continuing mission to provide you with exceptional heart care, our providers are all part of one team.  This team includes your primary Cardiologist (physician) and Advanced Practice Providers or APPs (Physician Assistants and Nurse Practitioners) who all work together to provide you with the care you need, when you need it.  Your next appointment:   3 month(s)  Provider:   Bertha Broad, MD     We recommend signing up for the patient portal called "MyChart".  Sign up information is provided on this After Visit Summary.  MyChart is used to connect with patients for Virtual Visits (Telemedicine).  Patients are able to view lab/test results, encounter notes, upcoming appointments, etc.  Non-urgent messages can be sent to your provider as well.   To learn more  about what you can do with MyChart, go to [email or web address].   Other Instructions

## 2024-01-19 ENCOUNTER — Ambulatory Visit: Payer: Self-pay

## 2024-01-19 LAB — HEMOGLOBIN AND HEMATOCRIT, BLOOD
Hematocrit: 44.2 % (ref 34.0–46.6)
Hemoglobin: 13.6 g/dL (ref 11.1–15.9)

## 2024-02-10 DIAGNOSIS — J449 Chronic obstructive pulmonary disease, unspecified: Secondary | ICD-10-CM | POA: Diagnosis not present

## 2024-02-10 DIAGNOSIS — E118 Type 2 diabetes mellitus with unspecified complications: Secondary | ICD-10-CM | POA: Diagnosis not present

## 2024-02-10 DIAGNOSIS — Z6827 Body mass index (BMI) 27.0-27.9, adult: Secondary | ICD-10-CM | POA: Diagnosis not present

## 2024-02-10 DIAGNOSIS — K219 Gastro-esophageal reflux disease without esophagitis: Secondary | ICD-10-CM | POA: Diagnosis not present

## 2024-02-10 DIAGNOSIS — I421 Obstructive hypertrophic cardiomyopathy: Secondary | ICD-10-CM | POA: Diagnosis not present

## 2024-02-15 ENCOUNTER — Ambulatory Visit (HOSPITAL_BASED_OUTPATIENT_CLINIC_OR_DEPARTMENT_OTHER)
Admission: EM | Admit: 2024-02-15 | Discharge: 2024-02-15 | Disposition: A | Discharge status: Home / Self Care | Attending: Family Medicine | Admitting: Family Medicine

## 2024-02-15 ENCOUNTER — Ambulatory Visit (INDEPENDENT_AMBULATORY_CARE_PROVIDER_SITE_OTHER)
Admit: 2024-02-15 | Discharge: 2024-02-15 | Disposition: A | Discharge status: Home / Self Care | Attending: Family Medicine | Admitting: Family Medicine

## 2024-02-15 ENCOUNTER — Encounter (HOSPITAL_BASED_OUTPATIENT_CLINIC_OR_DEPARTMENT_OTHER): Payer: Self-pay | Admitting: Emergency Medicine

## 2024-02-15 DIAGNOSIS — I700 Atherosclerosis of aorta: Secondary | ICD-10-CM | POA: Diagnosis not present

## 2024-02-15 DIAGNOSIS — J441 Chronic obstructive pulmonary disease with (acute) exacerbation: Secondary | ICD-10-CM | POA: Diagnosis not present

## 2024-02-15 DIAGNOSIS — I771 Stricture of artery: Secondary | ICD-10-CM | POA: Diagnosis not present

## 2024-02-15 DIAGNOSIS — R051 Acute cough: Secondary | ICD-10-CM | POA: Diagnosis not present

## 2024-02-15 DIAGNOSIS — R059 Cough, unspecified: Secondary | ICD-10-CM

## 2024-02-15 DIAGNOSIS — R0602 Shortness of breath: Secondary | ICD-10-CM | POA: Diagnosis not present

## 2024-02-15 MED ORDER — IPRATROPIUM-ALBUTEROL 0.5-2.5 (3) MG/3ML IN SOLN
3.0000 mL | Freq: Once | RESPIRATORY_TRACT | Status: AC
  Administered 2024-02-15: 3 mL via RESPIRATORY_TRACT

## 2024-02-15 MED ORDER — TRIAMCINOLONE ACETONIDE 40 MG/ML IJ SUSP
40.0000 mg | Freq: Once | INTRAMUSCULAR | Status: AC
  Administered 2024-02-15: 40 mg via INTRAMUSCULAR

## 2024-02-15 NOTE — Discharge Instructions (Signed)
 COPD exacerbation with shortness of breath and cough: Patient's inhalers are not significantly improving her condition.  Kenalog  40 mg injection during the visit.  Chest was negative for pneumonia.    Yeah okay patient has been waiting for over 3 weeks for an inhaler that her insurance would approve from her primary care.  If she cannot get additional inhalers and needs them, she should contact pulmonology and see if they can help her.

## 2024-02-15 NOTE — ED Provider Notes (Signed)
 [name]    CSN: [id number] Arrival date & time: 02/15/24  1638      History   Chief Complaint No chief complaint on file.   HPI Sheila [name] is a 83 y.o. female.   Patient reports congestion and shortness of breath for almost a month (approximately 01/18/2024 or earlier).  She was having persistent shortness of breath and wheezing.  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  She sees pulmonology and saw Dr. Jarrett Merry on 01/05/2024.  The patient was seen by primary care in [facility] May of 2025.  She was given 2 inhalers.  One inhaler was filled but the other inhaler was too expensive and her insurance rejected it.  Dr. Francoise Ishihara nurse told her they would get her an alternative inhaler but they never did it.  She saw him middle of last week and still did not have a new inhaler after the visit and never got a call back from the office.  At the end of the week she called and spoke to the nurse who said they would get it figured out but if she got worse over the weekend go to urgent care.  She has persistent shortness of breath and wheezing and she has nebulizer treatments that she does every 4-6 hours at home and she just feels very short of breath.     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   Coronary artery disease involving native coronary artery of native heart without angina pectoris 10/10/2015   Depression    Dyslipidemia 10/10/2015   Dyspnea 11/21/2019   Essential hypertension 08/28/2016   Eustachian tube dysfunction, right 11/28/2022   GERD (gastroesophageal reflux disease)    Glaucoma    Headache above the eye region 11/10/2017   Heart murmur    History of right mastoidectomy  06/29/2020   Hyperlipemia 11/28/2022   Hyperlipidemia    Hypertension    Imbalance 04/17/2022   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  Similar morphology on prior echocardiograms without significant gradients reported.  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   Lightheadedness 11/16/2023   Non-recurrent acute serous otitis media of right ear 06/18/2023   Chest discomfort 06/02/2023   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   Traumatic hematoma of forehead 02/05/2023  Periorbital ecchymosis of left eye 02/05/2023   Subdural hemorrhage (HCC) 01/28/2023   Eustachian tube dysfunction, right 11/28/2022   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 11/28/2022   Hyperlipemia 11/28/2022   Imbalance 04/17/2022   Otalgia, right ear 04/17/2022   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   Chronic infective otitis externa, 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   Mild aortic stenosis 02/21/2021   Allergy    Anxiety    Arthritis    Asthma     Blood transfusion without reported diagnosis    Cataract    Chronic headaches    Depression    Glaucoma    Heart murmur    Hyperlipidemia    Hypertension    History of right mastoidectomy 06/29/2020   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    EYE SURGERY     LEFT HEART CATH AND CORONARY ANGIOGRAPHY N/A 12/28/2017   Procedure: LEFT HEART CATH AND CORONARY ANGIOGRAPHY;  Surgeon: Arty Binning, MD;  Location: [facility];  Service: Cardiovascular;  Laterality: N/A;   MASTOIDECTOMY     PATENT DUCTUS ARTERIOUS REPAIR     SHOULDER OPEN ROTATOR CUFF REPAIR Bilateral    UPPER GASTROINTESTINAL ENDOSCOPY  10/15/2021    OB History   No obstetric history on file.      Home Medications    Prior to Admission medications   Medication Sig Start Date End Date Taking? Authorizing Provider  albuterol  (VENTOLIN  HFA) 108 (90 Base) MCG/ACT inhaler Inhale 2 puffs into the lungs every 6 (six) hours as needed for wheezing or shortness of breath. 07/24/21  Yes [provider]  dorzolamide-timolol (COSOPT) 2-0.5 % ophthalmic solution Place 1 drop into both eyes 2 (two) times daily. 08/28/23  Yes [provider]  isosorbide  mononitrate (IMDUR ) 30 MG 24 hr tablet Take 1 tablet (30 mg total) by mouth daily. 11/02/23  Yes Madireddy, Sreedhar R,  MD  latanoprost (XALATAN) 0.005 % ophthalmic solution Place 1 drop into both eyes at bedtime. 10/15/22  Yes [provider]  metoprolol  succinate (TOPROL -XL) 50 MG 24 hr tablet Take 1 tablet (50 mg total) by mouth daily. Take with or immediately following a meal. 11/16/23 02/15/24 Yes Madireddy, Daymon Evans, MD  potassium chloride (MICRO-K) 10 MEQ CR capsule Take 10 mEq by mouth every morning. 10/24/20  Yes [provider]  Tiotropium Bromide-Olodaterol (STIOLTO RESPIMAT) 2.5-2.5 MCG/ACT AERS Inhale 2 puffs into the lungs daily.   Yes [provider]  verapamil  (CALAN ) 40 MG tablet Take 1 tablet (40 mg total) by mouth 2 (two) times daily. 11/16/23  Yes Madireddy, Daymon Evans, MD  ALPRAZolam (XANAX) 0.25 MG tablet Take 0.25 mg by mouth 2 (two) times daily as needed for anxiety.    [provider]  aspirin  EC 81 MG tablet Take 81 mg by mouth daily.    [provider]  Coenzyme Q10 (CO Q-10) 200 MG CAPS Take 1 capsule by mouth daily.    [provider]  dicyclomine (BENTYL) 20 MG tablet Take 20 mg by mouth every 8 (eight) hours as needed for spasms. 07/16/23   [provider]  dorzolamide (TRUSOPT) 2 % ophthalmic solution Place 1 drop into both eyes 2 (two) times daily. 03/14/22   [provider]  FLOVENT HFA 110 MCG/ACT inhaler Inhale 1 puff into the lungs 2 (two) times daily. 11/29/20   [provider]  fluticasone (FLONASE) 50 MCG/ACT nasal spray Place 2 sprays into both nostrils daily. 11/29/20   [provider]  furosemide  (LASIX ) 40 MG tablet Take 20 mg by mouth as needed for fluid or edema.    [provider]  linaclotide (LINZESS) 72 MCG capsule Take 72 mcg by mouth daily.    [provider]  Multiple Vitamin (MULTIVITAMIN) tablet Take 1 tablet by mouth daily.    [provider]  nitroGLYCERIN  (NITROSTAT ) 0.4 MG SL tablet Place 1 tablet (0.4 mg total) under the tongue every 5 (five) minutes as  needed for chest pain. 10/06/23   Madireddy, Daymon Evans, MD  rosuvastatin  (CRESTOR ) 40 MG tablet Take 40 mg by mouth every Monday, Wednesday, and Friday.    [provider]  sucralfate (CARAFATE) 1 g tablet Take 1 g by mouth 3 (three) times daily as needed (GI). 07/16/23   [provider]  VOQUEZNA 20 MG TABS Take 1 tablet by mouth daily. 04/28/23   [provider]    Family History Family History  Problem Relation Age of Onset   Heart attack Mother    Heart attack Sister    Stomach cancer Sister    Colon cancer Brother    Diabetes Brother    Diabetes Daughter    Esophageal cancer Neg Hx    Colon polyps Neg Hx    Rectal cancer Neg Hx     Social History Social History   Tobacco Use   Smoking status: Never   Smokeless tobacco: Never  Vaping Use   Vaping status: Never Used  Substance Use Topics   Alcohol use: No   Drug use: Never     Allergies   Antihistamines, chlorpheniramine-type; Ceftin [cefuroxime axetil]; Pheneen [benzalkonium chloride]; Ativan [lorazepam]; Meloxicam; and Morphine    Review of Systems Review of Systems  Constitutional:  Negative for chills and fever.  HENT:  Negative for ear pain and sore throat.   Eyes:  Negative for pain and visual disturbance.  Respiratory:  Positive for cough, chest tightness, shortness of breath and wheezing.   Cardiovascular:  Negative for chest pain and palpitations.  Gastrointestinal:  Negative for abdominal pain, constipation, diarrhea, nausea and vomiting.  Genitourinary:  Negative for dysuria and hematuria.  Musculoskeletal:  Negative for arthralgias and back pain.  Skin:  Negative for color change and rash.  Neurological:  Negative for seizures and syncope.  All other systems reviewed and are negative.    Physical Exam Triage Vital Signs ED Triage Vitals  Encounter Vitals Group     BP 02/15/24 1823 (!) 169/88     Systolic BP Percentile --      Diastolic BP Percentile --      Pulse Rate  02/15/24 1823 72     Resp 02/15/24 1823 18     Temp 02/15/24 1823 97.8 F (36.6 C)     Temp Source 02/15/24 1823 Oral     SpO2 02/15/24 1823 94 %  Weight --      Height --      Head Circumference --      Peak Flow --      Pain Score 02/15/24 1815 0     Pain Loc --      Pain Education --      Exclude from Growth Chart --    No data found.  Updated Vital Signs BP (!) 169/88 (BP Location: Left Arm)   Pulse 72   Temp 97.8 F (36.6 C) (Oral)   Resp 18   SpO2 94%   Visual Acuity Right Eye Distance:   Left Eye Distance:   Bilateral Distance:    Right Eye Near:   Left Eye Near:    Bilateral Near:     Physical Exam Vitals and nursing note reviewed.  Constitutional:      General: She is not in acute distress.    Appearance: She is well-developed. She is not ill-appearing or toxic-appearing.  HENT:     Head: Normocephalic and atraumatic.     Right Ear: Hearing, tympanic membrane, ear canal and external ear normal.     Left Ear: Hearing, tympanic membrane, ear canal and external ear normal.     Nose: No congestion or rhinorrhea.     Right Sinus: No maxillary sinus tenderness or frontal sinus tenderness.     Left Sinus: No maxillary sinus tenderness or frontal sinus tenderness.     Mouth/Throat:     Lips: Pink.     Mouth: Mucous membranes are moist.     Pharynx: Uvula midline. No oropharyngeal exudate or posterior oropharyngeal erythema.     Tonsils: No tonsillar exudate.  Eyes:     Conjunctiva/sclera: Conjunctivae normal.     Pupils: Pupils are equal, round, and reactive to light.  Cardiovascular:     Rate and Rhythm: Normal rate and regular rhythm.     Heart sounds: S1 normal and S2 normal. No murmur heard. Pulmonary:     Effort: Pulmonary effort is normal. No respiratory distress.     Breath sounds: Examination of the right-upper field reveals wheezing and rhonchi. Examination of the left-upper field reveals wheezing and rhonchi. Examination of the right-middle  field reveals wheezing. Examination of the left-middle field reveals wheezing. Examination of the right-lower field reveals decreased breath sounds and wheezing. Examination of the left-lower field reveals decreased breath sounds and wheezing. Decreased breath sounds, wheezing and rhonchi present. No rales.     Comments: Reassessment after DuoNeb treatment:  Breath sounds were more audible throughout but the wheezing continued.  O2 Sat increased to % on Room Air after the treatment. Abdominal:     General: Bowel sounds are normal.     Palpations: Abdomen is soft.     Tenderness: There is no abdominal tenderness.  Musculoskeletal:        General: No swelling.     Cervical back: Neck supple.  Lymphadenopathy:     Head:     Right side of head: No submental, submandibular, tonsillar, preauricular or posterior auricular adenopathy.     Left side of head: No submental, submandibular, tonsillar, preauricular or posterior auricular adenopathy.     Cervical: No cervical adenopathy.     Right cervical: No superficial cervical adenopathy.    Left cervical: No superficial cervical adenopathy.  Skin:    General: Skin is warm and dry.     Capillary Refill: Capillary refill takes less than 2 seconds.     Findings: No rash.  Neurological:  Mental Status: She is alert and oriented to person, place, and time.  Psychiatric:        Mood and Affect: Mood normal.      UC Treatments / Results  Labs (all labs ordered are listed, but only abnormal results are displayed) Labs Reviewed - No data to display  Comprehensive metabolic panel:  12/16/79:    Component Ref Range & Units (hover) 3 mo ago (11/04/23)  Sodium 136  Potassium 3.8  Chloride 100  CO2 26  Glucose, Bld 74  Comment: Glucose reference range applies only to samples taken after fasting for at least 8 hours.  BUN 13  Creatinine, Ser 0.48  Calcium  9.4  Total Protein 7.0  Albumin 3.7  AST 15  ALT 15  Alkaline Phosphatase 55  Total  Bilirubin 0.8  GFR, Estimated >60  Comment: (NOTE) Calculated using the CKD-EPI Creatinine Equation (2021)  Anion gap 10  Comment: Performed at [facility], [address] [place], [place] [address]  Resulting Agency [facility]      EKG   Radiology DG Chest 2 View Result Date: 02/15/2024 CLINICAL DATA:  cough EXAM: CHEST - 2 VIEW COMPARISON:  November 04, 2023, August 20, 2023, October 09, 2023 FINDINGS: Unchanged prominence of the right paratracheal stripe, likely due to patient rotation. No focal airspace consolidation, pleural effusion, or pneumothorax. No cardiomegaly. Tortuous aorta with aortic atherosclerosis. No acute fracture or destructive lesion. Multilevel degenerative disc disease of the spine. Left glenohumeral joint arthroplasty. IMPRESSION: No acute cardiopulmonary abnormality. Electronically Signed   By: Rance Burrows M.D.   On: 02/15/2024 19:29    Procedures Procedures (including critical care time)  Medications Ordered in UC Medications  ipratropium-albuterol  (DUONEB) 0.5-2.5 (3) MG/3ML nebulizer solution 3 mL (3 mLs Nebulization Given 02/15/24 1843)  triamcinolone  acetonide (KENALOG -40) injection 40 mg (40 mg Intramuscular Given 02/15/24 1929)    Initial Impression / Assessment and Plan / UC Course  I have reviewed the triage vital signs and the nursing notes.  Pertinent labs & imaging results that were available during my care of the patient were reviewed by me and considered in my medical decision making (see chart for details).  Plan of Care: Acute COPD exacerbation with shortness of breath: Chest x-ray was negative for acute cardiopulmonary problems.  Exam was most consistent with just a COPD exacerbation.  Kenalog  40 g given during the visit.  Continue current inhaler use handheld neb treatments every 4-6 hours at home.  Encourage patient to connect with her primary care for her pulmonologist to try to get the additional inhalers that she needs.   There is been a delay due to insurance formulary issues but I do believe she needs additional inhalers beyond what she has right now.  Follow-up here as needed.  I reviewed the plan of care with the patient and/or the patient's guardian.  The patient and/or guardian had time to ask questions and acknowledged that the questions were answered.  I provided instruction on symptoms or reasons to return here or to go to an ER, if symptoms/condition did not improve, worsened or if new symptoms occurred.  Final Clinical Impressions(s) / UC Diagnoses   Final diagnoses:  Acute cough  Shortness of breath  COPD exacerbation (HCC)     Discharge Instructions      COPD exacerbation with shortness of breath and cough: Patient's inhalers are not significantly improving her condition.  Kenalog  40 mg injection during the visit.  Chest was negative  for pneumonia.    The patient has been waiting for over 3 weeks for an inhaler that her insurance would approve from her primary care.  If she cannot get additional inhalers and needs them, she should contact pulmonology and see if they can help her.     ED Prescriptions   None    PDMP not reviewed this encounter.   Guss Legacy, FNP 02/16/24 [phone]

## 2024-02-15 NOTE — ED Triage Notes (Signed)
 Pt c/o shortness of breath, primary doctor last Tuesday called inhaler with steroids but wasn't able to get it due to price, primary is trying to get her another medication approved from pharmacy but was told to go to urgent care is worse. Pt reports due to humidly she has chest pain and wheezing.

## 2024-02-20 ENCOUNTER — Ambulatory Visit: Payer: Self-pay

## 2024-02-20 DIAGNOSIS — R42 Dizziness and giddiness: Secondary | ICD-10-CM

## 2024-02-26 DIAGNOSIS — Z1231 Encounter for screening mammogram for malignant neoplasm of breast: Secondary | ICD-10-CM | POA: Diagnosis not present

## 2024-03-04 ENCOUNTER — Other Ambulatory Visit: Payer: Self-pay

## 2024-03-16 ENCOUNTER — Encounter (HOSPITAL_COMMUNITY): Payer: Self-pay

## 2024-03-17 ENCOUNTER — Ambulatory Visit (HOSPITAL_COMMUNITY)
Admission: RE | Admit: 2024-03-17 | Discharge: 2024-03-17 | Disposition: A | Discharge status: Home / Self Care | Source: Ambulatory Visit

## 2024-03-17 ENCOUNTER — Other Ambulatory Visit: Payer: Self-pay

## 2024-03-17 DIAGNOSIS — I421 Obstructive hypertrophic cardiomyopathy: Secondary | ICD-10-CM

## 2024-03-17 DIAGNOSIS — I2510 Atherosclerotic heart disease of native coronary artery without angina pectoris: Secondary | ICD-10-CM

## 2024-03-17 MED ORDER — GADOBUTROL 1 MMOL/ML IV SOLN
10.0000 mL | Freq: Once | INTRAVENOUS | Status: AC | PRN
  Administered 2024-03-17: 10 mL via INTRAVENOUS

## 2024-03-30 ENCOUNTER — Telehealth: Payer: Self-pay

## 2024-03-30 ENCOUNTER — Other Ambulatory Visit: Payer: Self-pay

## 2024-03-30 NOTE — Telephone Encounter (Signed)
 Called the patient and informed her of Dr. Madireddy's recommendation below:  She does not have any significant obstructive coronary abnormalities on cardiac CT from last year, there is mild obstructive left ventricular hypertrophy without any major abnormalities on cardiac MRI.   With ongoing episodes of chest pain, would recommend evaluation in the ER so they can ensure there is no acute coronary syndrome wide-ranging troponins.   Recommend she keep herself well-hydrated drink plenty of fluids. Thank you  Patient stated that she would go to the ER in a couple of days if she was still feeling bad. I encouraged her that the recommendation was for her to go to the ER today for her to be evaluated and to make sure nothing acute was happening. Patient stated that she would have to find someone to take her to the ER but she would go as soon as she could. Patient had no further questions at this time.

## 2024-03-30 NOTE — Telephone Encounter (Signed)
 Pt c/o of Chest Pain: STAT if active (IN THIS MOMENT) CP, including tightness, pressure, jaw pain, shoulder/upper arm/back pain, SOB, nausea, and vomiting.  1. Are you having CP right now (tightness, pressure, or discomfort)? No  2. Are you experiencing any other symptoms (ex. SOB, nausea, vomiting, sweating)? Lightheaded   3. How long have you been experiencing CP? A few weeks   4. Is your CP continuous or coming and going? Coming and going   5. Have you taken Nitroglycerin ? N/A  6. If CP returns before callback, please consider calling 911. ?

## 2024-03-30 NOTE — Telephone Encounter (Signed)
 Called the patient and she reported that she has been having sharp pain in her left upper chest that goes into her back, shoulder and lower jaw. The pain comes and goes with differing intensity and frequency and has been happening over the past few weeks. She has no shortness of breath. She also has been feeling very light headed and her neurologist told her that Metoprolol  and Verapamil  can cause these symptoms. She is asking if they need to be adjusted and also to address her chest pain. Please advise.

## 2024-04-08 DIAGNOSIS — H402232 Chronic angle-closure glaucoma, bilateral, moderate stage: Secondary | ICD-10-CM | POA: Diagnosis not present

## 2024-04-08 DIAGNOSIS — H04129 Dry eye syndrome of unspecified lacrimal gland: Secondary | ICD-10-CM | POA: Diagnosis not present

## 2024-04-12 NOTE — Progress Notes (Signed)
 This encounter was created in error - please disregard.

## 2024-04-25 ENCOUNTER — Ambulatory Visit

## 2024-04-25 VITALS — BP 110/58 | HR 58 | Ht 63.0 in | Wt 154.0 lb

## 2024-04-25 DIAGNOSIS — I421 Obstructive hypertrophic cardiomyopathy: Secondary | ICD-10-CM

## 2024-04-25 DIAGNOSIS — I350 Nonrheumatic aortic (valve) stenosis: Secondary | ICD-10-CM

## 2024-04-25 DIAGNOSIS — I2510 Atherosclerotic heart disease of native coronary artery without angina pectoris: Secondary | ICD-10-CM

## 2024-04-25 DIAGNOSIS — I10 Essential (primary) hypertension: Secondary | ICD-10-CM | POA: Diagnosis not present

## 2024-04-25 MED ORDER — FUROSEMIDE 20 MG PO TABS: ORAL_TABLET | ORAL | 3 refills | Status: AC

## 2024-04-25 NOTE — Patient Instructions (Signed)
 Medication Instructions:  Your physician recommends that you continue on your current medications as directed. Please refer to the Current Medication list given to you today.  START: Lasix  20 mg two times a week. STOP: Imdur   *If you need a refill on your cardiac medications before your next appointment, please call your pharmacy*  Lab Work: None If you have labs (blood work) drawn today and your tests are completely normal, you will receive your results only by: MyChart Message (if you have MyChart) OR A paper copy in the mail If you have any lab test that is abnormal or we need to change your treatment, we will call you to review the results.  Testing/Procedures: None  Follow-Up: At [facility], you and your health needs are our priority.  As part of our continuing mission to provide you with exceptional heart care, our providers are all part of one team.  This team includes your primary Cardiologist (physician) and Advanced Practice Providers or APPs (Physician Assistants and Nurse Practitioners) who all work together to provide you with the care you need, when you need it.  Your next appointment:   6 month(s)  Provider:   Alean Kobus, MD    We recommend signing up for the patient portal called MyChart.  Sign up information is provided on this After Visit Summary.  MyChart is used to connect with patients for Virtual Visits (Telemedicine).  Patients are able to view lab/test results, encounter notes, upcoming appointments, etc.  Non-urgent messages can be sent to your provider as well.   To learn more about what you can do with MyChart, go to [email or web address].   Other Instructions None

## 2024-04-25 NOTE — Assessment & Plan Note (Signed)
 Midcavity gradient on prior echocardiogram March 2025 however cardiac MRI July 2025 did not show criteria consistent with hypertrophic cardiomyopathy. Normal biventricular function and no significant valve abnormalities. This is reassuring.  Advised her to lower her Lasix  dose to no more than 20 mg 2 times a week. If she feels any significant lower extremity edema is present, should notify us  promptly.  Advised her to keep her salt intake down below 2 g/day.

## 2024-04-25 NOTE — Progress Notes (Signed)
 Cardiology Consultation:    Date:  04/25/2024   ID:  Sheila [name], DOB 1941/04/12, MRN [id number]  PCP:  Fernand Tracey LABOR, MD  Cardiologist:  Alean SAUNDERS Arihaan Bellucci, MD   Referring MD: Fernand Tracey LABOR, MD   No chief complaint on file.    ASSESSMENT AND PLAN:   Sheila [name] 83 year old [name] history of mild nonobstructive coronary artery disease [prior cardiac cath 2019; last ischemic workup with Lexiscan  stress test 2024; cardiac CT coronary angiogram April 2020 for CAD RADS 2 study with calcium  score 96], surgical repair of PDA at age 48 [at [facility]], severe asymmetric left ventricular hypertrophy on echocardiogram with resting gradient across LV mid cavity 29 mmHg on echocardiogram March 2025, cardiac MRI completed July 2025 noted moderate asymmetric LVH basal septum 14 mm in thickness not meeting criteria for hypertrophic cardiomyopathy noted normal biventricular function and no evidence of LGE to suggest myocardial scar, COPD, 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   Problem List Items Addressed This Visit     Coronary artery disease involving native coronary artery of native heart without angina pectoris - Primary   Prior cardiac catheterization 2019 nonobstructive mild disease Ischemic workup Lexiscan  stress test 2024 Cardiac CT coronary angiogram April 2024 mild nonobstructive coronary artery disease CAD RADS 2 study calcium  score 96.  Chest pain that she describes appears noncardiac in description. Good functional status at baseline she continues to exercise routinely. Continue with aspirin  81 mg once daily. Imdur  was previously discontinued in April 2025 given symptoms of lightheadedness. She remains on low-dose metoprolol  succinate 50 mg once daily and verapamil  40 mg twice daily and  tolerating well.       Relevant Medications   furosemide  (LASIX ) 20 MG tablet   Essential hypertension   Well-controlled. Continue current medication metoprolol  succinate 50 mg once daily and verapamil  40 mg twice daily. Target blood pressure below 130/80 mmHg.       Relevant Medications   furosemide  (LASIX ) 20 MG tablet   Mild aortic stenosis   Continue to follow-up with repeat echocardiogram tentatively in March 2026.       Relevant Medications   furosemide  (LASIX ) 20 MG tablet   Obstructive hypertrophic cardiomyopathy (HCC)   Midcavity gradient on prior echocardiogram March 2025 however cardiac MRI July 2025 did not show criteria consistent with hypertrophic cardiomyopathy. Normal biventricular function and no significant valve abnormalities. This is reassuring.  Advised her to lower her Lasix  dose to no more than 20 mg 2 times a week. If she feels any significant lower extremity edema is present, should notify us  promptly.  Advised her to keep her salt intake down below 2 g/day.       Relevant Medications   furosemide  (LASIX ) 20 MG tablet    Return to clinic tentatively in 6 months.  History of Present Illness:    Sheila [name] is a 83 y.o. female who is being seen today for follow-up visit. PCP is Fernand Tracey LABOR, MD. Last visit with me in the office was 01/18/2024.  Sheila [name] here for the visit by herself.  Has history of mild nonobstructive coronary artery disease [prior cardiac cath 2019; last ischemic workup with Lexiscan  stress test 2024; cardiac CT coronary angiogram April 2020 for CAD RADS 2 study with calcium  score 96], surgical  repair of PDA at age 83 [at [facility]], severe asymmetric left ventricular hypertrophy on echocardiogram with resting gradient across LV mid cavity 29 mmHg on echocardiogram March 2025, cardiac MRI completed July 2025 noted moderate asymmetric LVH basal septum 14 mm in thickness not meeting criteria for hypertrophic cardiomyopathy  noted normal biventricular function and no evidence of LGE to suggest myocardial scar, COPD, superior mediastinal mass on CT chest from January 2025 at [facility] I do not see any follow-up imaging, this was felt to be a thyroid  enlargement and endocrinology referral was placed this is currently pending in September 2025.  Also underwent workup with treadmill stress test March 2025 which showed 78% MPHR after 3 minutes and 2 seconds on the treadmill, attaining 4.6 METS stopped due to fatigue, no ischemic changes, no triggered arrhythmias.  There was an isolated PVC.  A Zio patch for 14 days study from March 2025 noted rare ventricular and supraventricular ectopy burden less than 1%, 3 short runs of NSVT with the longest episode lasting 16 beats, asymptomatic.  5 short runs of SVT with the longest episode lasting 10 beats.  Patient triggered events correlated with normal heart rate and rhythm.  She continues to keep herself busy. Ambulates using a cane. Mentions occasionally she does feel chest pain in the upper part which is worse with movement and reproducible by pushing on the upper chest wall.  Does get lightheaded and dizzy on sudden change of position at times.  No falls. Denies any syncopal episodes. Denies any blood in urine or stools.  Good compliance with her medications. She has been off Imdur  since April 2025 given symptoms of lightheadedness.  However this still shows up on her medication list. Uses Lasix  20 mg every other day.  Also reviewed cardiac MRI results extensively today.  She mentioned she did not hear about the MRI results.  Looking at MyChart message sent to her discussing the test results was read by her on the same day.  Recommended if she feels the test results were not communicated she should right away contact my office in future so as to avoid any delays in communication. Past Medical History:  Diagnosis Date   Allergy    Anxiety    Arthritis     Asthma    Blood transfusion without reported diagnosis    Cataract    Chest discomfort 06/02/2023   Chest pain 07/02/2018   Chronic headaches    Chronic infective otitis externa, 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   Chronic obstructive asthma (HCC) 05/18/2020   Coronary artery disease involving native coronary artery of native heart without angina pectoris 10/10/2015   Depression    Dyslipidemia 10/10/2015   Dyspnea 11/21/2019   Essential hypertension 08/28/2016   Eustachian tube dysfunction, right 11/28/2022   GERD (gastroesophageal reflux disease)    Glaucoma    Headache above the eye region 11/10/2017   Heart murmur    History of right mastoidectomy 06/29/2020   Hyperlipemia 11/28/2022   Hyperlipidemia    Hypertension    Imbalance 04/17/2022   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  Similar  morphology on prior echocardiograms without significant gradients reported.  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    EYE SURGERY     LEFT HEART CATH  AND CORONARY ANGIOGRAPHY N/A 12/28/2017   Procedure: LEFT HEART CATH AND CORONARY ANGIOGRAPHY;  Surgeon: Claudene Victory ORN, MD;  Location: [facility];  Service: Cardiovascular;  Laterality: N/A;   MASTOIDECTOMY     PATENT DUCTUS ARTERIOUS REPAIR     SHOULDER OPEN ROTATOR CUFF REPAIR Bilateral    UPPER GASTROINTESTINAL ENDOSCOPY  10/15/2021    Current Medications: Current Meds  Medication Sig   albuterol  (VENTOLIN  HFA) 108 (90 Base) MCG/ACT inhaler Inhale 2 puffs into the lungs every 6 (six) hours as needed for wheezing or shortness of breath.   ALPRAZolam (XANAX) 0.25 MG tablet Take 0.25 mg by mouth 2 (two) times daily as needed for anxiety.   aspirin  EC 81 MG tablet Take 81 mg by mouth daily.   Coenzyme Q10 (CO Q-10) 200 MG CAPS Take 1 capsule by mouth daily.   dicyclomine (BENTYL) 20 MG tablet Take 20 mg by mouth every 8 (eight) hours as needed for spasms.   dorzolamide (TRUSOPT) 2 % ophthalmic solution Place 1 drop into both eyes 2 (two) times daily.   dorzolamide-timolol (COSOPT) 2-0.5 % ophthalmic solution Place 1 drop into both eyes 2 (two) times daily.   FLOVENT HFA 110 MCG/ACT inhaler Inhale 1 puff into the lungs 2 (two) times daily.   fluticasone (FLONASE) 50 MCG/ACT nasal spray Place 2 sprays into both nostrils daily.   furosemide  (LASIX ) 20 MG tablet Please take this medication two times per week.   latanoprost (XALATAN) 0.005 % ophthalmic solution Place 1 drop into both eyes at bedtime.   linaclotide (LINZESS) 72 MCG capsule Take 72 mcg by mouth daily.   metoprolol  succinate (TOPROL -XL) 50 MG 24 hr tablet Take 1 tablet (50 mg total) by mouth daily. Take with or immediately following a meal.   Multiple Vitamin (MULTIVITAMIN) tablet Take 1 tablet by mouth daily.   nitroGLYCERIN  (NITROSTAT ) 0.4 MG SL tablet Place 1 tablet (0.4 mg total) under the tongue every 5 (five) minutes as needed for chest pain.   potassium chloride (MICRO-K) 10 MEQ CR capsule Take 10 mEq by mouth every  morning.   rosuvastatin  (CRESTOR ) 40 MG tablet Take 40 mg by mouth every Monday, Wednesday, and Friday.   sucralfate (CARAFATE) 1 g tablet Take 1 g by mouth 3 (three) times daily as needed (GI).   Tiotropium Bromide-Olodaterol (STIOLTO RESPIMAT) 2.5-2.5 MCG/ACT AERS Inhale 2 puffs into the lungs daily.   verapamil  (CALAN ) 40 MG tablet TAKE 1 TABLET BY MOUTH TWICE A DAY   VOQUEZNA 20 MG TABS Take 1 tablet by mouth daily.   [DISCONTINUED] furosemide  (LASIX ) 40 MG tablet Take 20 mg by mouth as needed for fluid or edema.   [DISCONTINUED] isosorbide  mononitrate (IMDUR ) 30 MG 24 hr tablet Take 1 tablet (30 mg total) by mouth daily.     Allergies:   Antihistamines,  chlorpheniramine-type; Ceftin [cefuroxime axetil]; Pheneen [benzalkonium chloride]; Ativan [lorazepam]; Meloxicam; and Morphine    Social History   Socioeconomic History   Marital status: Divorced    Spouse name: Not on file   Number of children: 5   Years of education: Not on file   Highest education level: Not on file  Occupational History   Occupation: Retired   Tobacco Use   Smoking status: Never   Smokeless tobacco: Never  Vaping Use   Vaping status: Never Used  Substance and Sexual Activity   Alcohol use: No   Drug use: Never   Sexual activity: Not Currently    Birth control/protection: Post-menopausal  Other Topics Concern   Not on file  Social History Narrative   Not on file   Social Drivers of Health   Financial Resource Strain: Low Risk  (01/29/2023)   [facility]   Overall Financial Resource Strain (CARDIA)    Difficulty of Paying Living Expenses: Not hard at all  Food Insecurity: Low Risk  (01/14/2024)   Received from [facility]   Hunger Vital Sign    Within the past 12 months, you worried that your food would run out before you got money to buy more: Never true    Within the past 12 months, the food you bought just didn't last and you didn't have money to get  more. : Never true  Transportation Needs: No Transportation Needs (01/14/2024)   Received from [facility]    In the past 12 months, has lack of reliable transportation kept you from medical appointments, meetings, work or from getting things needed for daily living? : No  Physical Activity: Not on file  Stress: Not on file  Social Connections: Not on file     Family History: The patient's family history includes Colon cancer in her brother; Diabetes in her brother and daughter; Heart attack in her mother and sister; Stomach cancer in her sister. There is no history of Esophageal cancer, Colon polyps, or Rectal cancer. ROS:   Please see the history of present illness.    All 14 point review of systems negative except as described per history of present illness.  EKGs/Labs/Other Studies Reviewed:    The following studies were reviewed today:   EKG:       Recent Labs: 11/04/2023: ALT 15; BUN 13; Creatinine, Ser 0.48; Platelets 197; Potassium 3.8; Sodium 136 01/18/2024: Hemoglobin 13.6  Recent Lipid Panel    Component Value Date/Time   CHOL 169 03/17/2023 1028   TRIG 88 03/17/2023 1028   HDL 55 03/17/2023 1028   CHOLHDL 3.1 03/17/2023 1028   LDLCALC 98 03/17/2023 1028    Physical Exam:    VS:  BP (!) 110/58   Pulse (!) 58   Ht 5' 3 (1.6 m)   Wt 154 lb (69.9 kg)   SpO2 98%   BMI 27.28 kg/m     Wt Readings from Last 3 Encounters:  04/25/24 154 lb (69.9 kg)  01/18/24 156 lb 3.2 oz (70.9 kg)  11/16/23 156 lb 6.4 oz (70.9 kg)     GENERAL:  Well nourished, well developed in no acute distress NECK: No JVD; No carotid bruits CARDIAC: RRR, S1 and S2 present, 3/6 ejection systolic murmur best heard in aortic area CHEST:  Clear to auscultation without rales, wheezing or rhonchi  Extremities: No pitting pedal edema. Pulses bilaterally symmetric with radial 2+ and dorsalis pedis 2+ NEUROLOGIC:  Alert and  oriented x 3  Medication Adjustments/Labs and Tests  Ordered: Current medicines are reviewed at length with the patient today.  Concerns regarding medicines are outlined above.  No orders of the defined types were placed in this encounter.  Meds ordered this encounter  Medications   furosemide  (LASIX ) 20 MG tablet    Sig: Please take this medication two times per week.    Dispense:  90 tablet    Refill:  3    Signed, Zenobia Kuennen reddy Flor Houdeshell, MD, MPH, [facility] [place]. 04/25/2024 1:51 PM    [facility]

## 2024-04-25 NOTE — Assessment & Plan Note (Signed)
 Well-controlled. Continue current medication metoprolol  succinate 50 mg once daily and verapamil  40 mg twice daily. Target blood pressure below 130/80 mmHg.

## 2024-04-25 NOTE — Assessment & Plan Note (Signed)
 Continue to follow-up with repeat echocardiogram tentatively in March 2026.

## 2024-04-25 NOTE — Assessment & Plan Note (Signed)
 Prior cardiac catheterization 2019 nonobstructive mild disease Ischemic workup Lexiscan  stress test 2024 Cardiac CT coronary angiogram April 2024 mild nonobstructive coronary artery disease CAD RADS 2 study calcium  score 96.  Chest pain that she describes appears noncardiac in description. Good functional status at baseline she continues to exercise routinely. Continue with aspirin  81 mg once daily. Imdur  was previously discontinued in April 2025 given symptoms of lightheadedness. She remains on low-dose metoprolol  succinate 50 mg once daily and verapamil  40 mg twice daily and tolerating well.

## 2024-05-18 ENCOUNTER — Ambulatory Visit (INDEPENDENT_AMBULATORY_CARE_PROVIDER_SITE_OTHER): Admitting: "Endocrinology

## 2024-05-18 ENCOUNTER — Encounter: Payer: Self-pay | Admitting: "Endocrinology

## 2024-05-18 VITALS — BP 124/80 | HR 84 | Ht 63.0 in | Wt 155.0 lb

## 2024-05-18 DIAGNOSIS — E041 Nontoxic single thyroid nodule: Secondary | ICD-10-CM

## 2024-05-18 LAB — TSH: TSH: 0.51 m[IU]/L (ref 0.40–4.50)

## 2024-05-18 LAB — T4, FREE: Free T4: 1.4 ng/dL (ref 0.8–1.8)

## 2024-05-18 NOTE — Progress Notes (Signed)
 Outpatient Endocrinology Note Sheila Birmingham, MD  05/18/24   Sheila [name] [birth year] [id number]  Referring Provider: Fernand Tracey LABOR, MD Primary Care Provider: Fernand Tracey LABOR, MD Subjective  No chief complaint on file.   Assessment & Plan  Diagnoses and all orders for this visit:  Thyroid  nodule -     US  THYROID ; Future -     TSH -     T4, free -     US  THYROID    CLEOTILDE SPADACCINI is not taking any thyroid  medication.  Educated on thyroid  axis.  Recommend the following: ordered lab today.  Repeat lab before next visit or sooner if symptoms of hyperthyroidism or hypothyroidism develop.  Notify us  immediately in case of significant weight gain or loss. Counseled on compliance and follow up needs.  History of thyroid  nodule s/p FNA that was benign per records but no actual FNA report available Last U'S last year per patient, no record Ordered U/S thyroid    I have reviewed current medications, nurse's notes, allergies, vital signs, past medical and surgical history, family medical history, and social history for this encounter. Counseled patient on symptoms, examination findings, lab findings, imaging results, treatment decisions and monitoring and prognosis. The patient understood the recommendations and agrees with the treatment plan. All questions regarding treatment plan were fully answered.   Return in about 2 months (around 07/18/2024) for visit, labs today.   Sheila Birmingham, MD  05/18/24   I have reviewed current medications, nurse's notes, allergies, vital signs, past medical and surgical history, family medical history, and social history for this encounter. Counseled patient on symptoms, examination findings, lab findings, imaging results, treatment decisions and monitoring and prognosis. The patient understood the recommendations and agrees with the treatment plan. All questions regarding treatment plan were fully answered.   History of Present  Illness Sheila [name] is a 83 y.o. year old female who presents to our clinic with thyroid  nodule diagnosed before 2016.   Symptoms suggestive of HYPOTHYROIDISM:  fatigue No weight gain No cold intolerance  No constipation  Yes, on linzess  Symptoms suggestive of HYPERTHYROIDISM:  weight loss  No heat intolerance No hyperdefecation  No palpitations  No  Compressive symptoms:  dysphagia  No dysphonia  Yes positional dyspnea (especially with simultaneous arms elevation)  No  Smokes  No On biotin  No Personal history of head/neck surgery/irradiation  No   Physical Exam  BP 124/80   Pulse 84   Ht 5' 3 (1.6 m)   Wt 155 lb (70.3 kg)   SpO2 96%   BMI 27.46 kg/m  Constitutional: well developed, well nourished Head: normocephalic, atraumatic, no exophthalmos Eyes: sclera anicteric, no redness Neck: + thyromegaly, - thyroid  tenderness; + R nodule palpated Lungs: normal respiratory effort Neurology: alert and oriented, no fine hand tremor Skin: dry, no appreciable rashes Musculoskeletal: no appreciable defects Psychiatric: normal mood and affect  Allergies Allergies  Allergen Reactions   Antihistamines, Chlorpheniramine-Type Anaphylaxis   Ceftin [Cefuroxime Axetil] Shortness Of Breath   Pheneen [Benzalkonium Chloride] Shortness Of Breath   Ativan [Lorazepam] Swelling   Meloxicam Palpitations   Morphine  Palpitations    Current Medications Patient's Medications  New Prescriptions   No medications on file  Previous Medications   ALBUTEROL  (VENTOLIN  HFA) 108 (90 BASE) MCG/ACT INHALER    Inhale 2 puffs into the lungs every 6 (six) hours as needed for wheezing or shortness of breath.   ALPRAZOLAM (XANAX) 0.25 MG TABLET    Take  0.25 mg by mouth 2 (two) times daily as needed for anxiety.   ASPIRIN  EC 81 MG TABLET    Take 81 mg by mouth daily.   COENZYME Q10 (CO Q-10) 200 MG CAPS    Take 1 capsule by mouth daily.   DICYCLOMINE (BENTYL) 20 MG TABLET    Take 20 mg by  mouth every 8 (eight) hours as needed for spasms.   DORZOLAMIDE (TRUSOPT) 2 % OPHTHALMIC SOLUTION    Place 1 drop into both eyes 2 (two) times daily.   DORZOLAMIDE-TIMOLOL (COSOPT) 2-0.5 % OPHTHALMIC SOLUTION    Place 1 drop into both eyes 2 (two) times daily.   FLOVENT HFA 110 MCG/ACT INHALER    Inhale 1 puff into the lungs 2 (two) times daily.   FLUTICASONE (FLONASE) 50 MCG/ACT NASAL SPRAY    Place 2 sprays into both nostrils daily.   FUROSEMIDE  (LASIX ) 20 MG TABLET    Please take this medication two times per week.   LATANOPROST (XALATAN) 0.005 % OPHTHALMIC SOLUTION    Place 1 drop into both eyes at bedtime.   LINACLOTIDE (LINZESS) 72 MCG CAPSULE    Take 72 mcg by mouth daily.   METOPROLOL  SUCCINATE (TOPROL -XL) 50 MG 24 HR TABLET    Take 1 tablet (50 mg total) by mouth daily. Take with or immediately following a meal.   MULTIPLE VITAMIN (MULTIVITAMIN) TABLET    Take 1 tablet by mouth daily.   NITROGLYCERIN  (NITROSTAT ) 0.4 MG SL TABLET    Place 1 tablet (0.4 mg total) under the tongue every 5 (five) minutes as needed for chest pain.   POTASSIUM CHLORIDE (MICRO-K) 10 MEQ CR CAPSULE    Take 10 mEq by mouth every morning.   ROSUVASTATIN  (CRESTOR ) 40 MG TABLET    Take 40 mg by mouth every Monday, Wednesday, and Friday.   SUCRALFATE (CARAFATE) 1 G TABLET    Take 1 g by mouth 3 (three) times daily as needed (GI).   TIOTROPIUM BROMIDE-OLODATEROL (STIOLTO RESPIMAT) 2.5-2.5 MCG/ACT AERS    Inhale 2 puffs into the lungs daily.   VERAPAMIL  (CALAN ) 40 MG TABLET    TAKE 1 TABLET BY MOUTH TWICE A DAY   VOQUEZNA 20 MG TABS    Take 1 tablet by mouth daily.  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   Chronic obstructive asthma (HCC) 05/18/2020   Coronary artery disease involving native coronary artery of native heart without angina pectoris 10/10/2015   Depression    Dyslipidemia 10/10/2015   Dyspnea 11/21/2019   Essential hypertension 08/28/2016   Eustachian tube dysfunction, right 11/28/2022   GERD (gastroesophageal reflux disease)    Glaucoma    Headache above the eye region 11/10/2017   Heart murmur    History of right mastoidectomy 06/29/2020   Hyperlipemia 11/28/2022   Hyperlipidemia    Hypertension    Imbalance 04/17/2022   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  Similar morphology on prior echocardiograms without significant gradients reported.  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    EYE SURGERY     LEFT HEART CATH AND CORONARY ANGIOGRAPHY N/A 12/28/2017   Procedure: LEFT HEART CATH AND CORONARY ANGIOGRAPHY;  Surgeon: Claudene Victory ORN, MD;   Location: [facility];  Service: Cardiovascular;  Laterality: N/A;   MASTOIDECTOMY     PATENT DUCTUS ARTERIOUS REPAIR     SHOULDER OPEN ROTATOR CUFF REPAIR Bilateral    UPPER GASTROINTESTINAL ENDOSCOPY  10/15/2021    Family History family history includes Colon cancer in her brother; Diabetes in her brother and daughter; Heart attack in her mother and sister; Stomach cancer in her sister.  Social History Social History   Socioeconomic History   Marital status: Divorced    Spouse name: Not on file   Number of children: 5   Years of education: Not on file   Highest education level: Not on file  Occupational History   Occupation: Retired   Tobacco Use   Smoking status: Never   Smokeless tobacco: Never  Vaping Use   Vaping status: Never Used  Substance and Sexual Activity   Alcohol use: No   Drug use: Never   Sexual activity: Not Currently    Birth control/protection: Post-menopausal  Other Topics Concern   Not on file  Social History Narrative   Not on file   Social Drivers of Health   Financial Resource Strain: Low Risk  (01/29/2023)   Received [facility]   Overall Financial Resource Strain (CARDIA)    Difficulty of Paying Living Expenses: Not hard at all  Food Insecurity: Low Risk  (01/14/2024)   Received from [facility]   Hunger Vital Sign    Within the past 12 months, you worried that your food would run out before you got money to buy more: Never true    Within the past 12 months, the food you bought just didn't last and you didn't have money to get more. : Never true  Transportation Needs: No Transportation Needs (01/14/2024)   Received from [facility]    In the past 12 months, has lack of reliable transportation kept you from medical appointments, meetings, work or from getting things needed for daily living? : No  Physical Activity: Not on file  Stress: Not on file  Social Connections: Not on file   Intimate Partner Violence: Not At Risk (01/28/2023)   Received from [facility] and [facility]   Humiliation, Afraid, Rape, and Kick questionnaire    Within the last year, have you been afraid of your partner or ex-partner?: No    Within the last year, have  you been humiliated or emotionally abused in other ways by your partner or ex-partner?: No    Within the last year, have you been kicked, hit, slapped, or otherwise physically hurt by your partner or ex-partner?: No    Within the last year, have you been raped or forced to have any kind of sexual activity by your partner or ex-partner?: No    Laboratory Investigations No results found for: TSH, FREET4   No results found for: TSI   No components found for: TRAB   Lab Results  Component Value Date   CHOL 169 03/17/2023   Lab Results  Component Value Date   HDL 55 03/17/2023   Lab Results  Component Value Date   LDLCALC 98 03/17/2023   Lab Results  Component Value Date   TRIG 88 03/17/2023   Lab Results  Component Value Date   CHOLHDL 3.1 03/17/2023   Lab Results  Component Value Date   CREATININE 0.48 11/04/2023   Lab Results  Component Value Date   GFR 84.61 08/20/2023      Component Value Date/Time   NA 136 11/04/2023 1531   NA 141 01/12/2023 1003   K 3.8 11/04/2023 1531   CL 100 11/04/2023 1531   CO2 26 11/04/2023 1531   GLUCOSE 74 11/04/2023 1531   BUN 13 11/04/2023 1531   BUN 13 01/12/2023 1003   CREATININE 0.48 11/04/2023 1531   CALCIUM  9.4 11/04/2023 1531   PROT 7.0 11/04/2023 1531   PROT 6.9 03/17/2023 1028   ALBUMIN 3.7 11/04/2023 1531   ALBUMIN 4.2 03/17/2023 1028   AST 15 11/04/2023 1531   ALT 15 11/04/2023 1531   ALKPHOS 55 11/04/2023 1531   BILITOT 0.8 11/04/2023 1531   BILITOT 0.3 03/17/2023 1028   GFRNONAA >60 11/04/2023 1531   GFRAA 86 02/21/2020 1614      Latest Ref Rng & Units 11/04/2023    3:31 PM 08/20/2023    9:45 AM 01/12/2023   10:03 AM  BMP  Glucose 70 -  99 mg/dL 74  56  98   BUN 8 - 23 mg/dL 13  11  13    Creatinine 0.44 - 1.00 mg/dL 9.51  9.41  9.34   BUN/Creat Ratio 12 - 28   20   Sodium 135 - 145 mmol/L 136  140  141   Potassium 3.5 - 5.1 mmol/L 3.8  3.8  3.9   Chloride 98 - 111 mmol/L 100  101  99   CO2 22 - 32 mmol/L 26  31  27    Calcium  8.9 - 10.3 mg/dL 9.4  9.5  9.9        Component Value Date/Time   WBC 5.1 11/04/2023 1531   RBC 5.15 (H) 11/04/2023 1531   HGB 13.6 01/18/2024 1225   HCT 44.2 01/18/2024 1225   PLT 197 11/04/2023 1531   MCV 82.5 11/04/2023 1531   MCH 26.2 11/04/2023 1531   MCHC 31.8 11/04/2023 1531   RDW 14.9 11/04/2023 1531   LYMPHSABS 2.1 11/04/2023 1531   MONOABS 0.7 11/04/2023 1531   EOSABS 0.2 11/04/2023 1531   BASOSABS 0.0 11/04/2023 1531      Parts of this note may have been dictated using voice recognition software. There may be variances in spelling and vocabulary which are unintentional. Not all errors are proofread. Please notify the dino if any discrepancies are noted or if the meaning of any statement is not clear.

## 2024-05-20 ENCOUNTER — Ambulatory Visit (INDEPENDENT_AMBULATORY_CARE_PROVIDER_SITE_OTHER)
Admission: RE | Admit: 2024-05-20 | Discharge: 2024-05-20 | Disposition: A | Discharge status: Home / Self Care | Source: Ambulatory Visit | Attending: "Endocrinology | Admitting: "Endocrinology

## 2024-05-20 DIAGNOSIS — E041 Nontoxic single thyroid nodule: Secondary | ICD-10-CM | POA: Diagnosis not present

## 2024-05-22 DIAGNOSIS — S0990XA Unspecified injury of head, initial encounter: Secondary | ICD-10-CM | POA: Diagnosis not present

## 2024-05-22 DIAGNOSIS — E049 Nontoxic goiter, unspecified: Secondary | ICD-10-CM | POA: Diagnosis not present

## 2024-05-22 DIAGNOSIS — E069 Thyroiditis, unspecified: Secondary | ICD-10-CM | POA: Diagnosis not present

## 2024-05-22 DIAGNOSIS — S50311A Abrasion of right elbow, initial encounter: Secondary | ICD-10-CM | POA: Diagnosis not present

## 2024-05-22 DIAGNOSIS — W19XXXA Unspecified fall, initial encounter: Secondary | ICD-10-CM | POA: Diagnosis not present

## 2024-05-22 DIAGNOSIS — E119 Type 2 diabetes mellitus without complications: Secondary | ICD-10-CM | POA: Diagnosis not present

## 2024-05-22 DIAGNOSIS — S0003XA Contusion of scalp, initial encounter: Secondary | ICD-10-CM | POA: Diagnosis not present

## 2024-05-22 DIAGNOSIS — W08XXXA Fall from other furniture, initial encounter: Secondary | ICD-10-CM | POA: Diagnosis not present

## 2024-05-22 DIAGNOSIS — Z79899 Other long term (current) drug therapy: Secondary | ICD-10-CM | POA: Diagnosis not present

## 2024-05-22 DIAGNOSIS — M542 Cervicalgia: Secondary | ICD-10-CM | POA: Diagnosis not present

## 2024-06-02 DIAGNOSIS — S0990XA Unspecified injury of head, initial encounter: Secondary | ICD-10-CM | POA: Diagnosis not present

## 2024-06-02 DIAGNOSIS — Z23 Encounter for immunization: Secondary | ICD-10-CM | POA: Diagnosis not present

## 2024-06-02 DIAGNOSIS — E538 Deficiency of other specified B group vitamins: Secondary | ICD-10-CM | POA: Diagnosis not present

## 2024-06-02 DIAGNOSIS — Z79899 Other long term (current) drug therapy: Secondary | ICD-10-CM | POA: Diagnosis not present

## 2024-06-02 DIAGNOSIS — E782 Mixed hyperlipidemia: Secondary | ICD-10-CM | POA: Diagnosis not present

## 2024-06-02 DIAGNOSIS — Z6827 Body mass index (BMI) 27.0-27.9, adult: Secondary | ICD-10-CM | POA: Diagnosis not present

## 2024-06-02 DIAGNOSIS — E118 Type 2 diabetes mellitus with unspecified complications: Secondary | ICD-10-CM | POA: Diagnosis not present

## 2024-07-04 DIAGNOSIS — G43909 Migraine, unspecified, not intractable, without status migrainosus: Secondary | ICD-10-CM | POA: Diagnosis not present

## 2024-07-04 DIAGNOSIS — Z6827 Body mass index (BMI) 27.0-27.9, adult: Secondary | ICD-10-CM | POA: Diagnosis not present

## 2024-07-04 DIAGNOSIS — Z23 Encounter for immunization: Secondary | ICD-10-CM | POA: Diagnosis not present

## 2024-07-04 DIAGNOSIS — H6123 Impacted cerumen, bilateral: Secondary | ICD-10-CM | POA: Diagnosis not present

## 2024-07-13 DIAGNOSIS — J449 Chronic obstructive pulmonary disease, unspecified: Secondary | ICD-10-CM | POA: Diagnosis not present

## 2024-07-13 DIAGNOSIS — Z122 Encounter for screening for malignant neoplasm of respiratory organs: Secondary | ICD-10-CM | POA: Diagnosis not present

## 2024-07-13 DIAGNOSIS — Z2839 Other underimmunization status: Secondary | ICD-10-CM | POA: Diagnosis not present

## 2024-07-13 DIAGNOSIS — R0609 Other forms of dyspnea: Secondary | ICD-10-CM | POA: Diagnosis not present

## 2024-07-15 ENCOUNTER — Other Ambulatory Visit: Payer: Self-pay | Admitting: Cardiology

## 2024-07-21 ENCOUNTER — Ambulatory Visit: Admitting: "Endocrinology

## 2024-07-21 ENCOUNTER — Encounter: Payer: Self-pay | Admitting: "Endocrinology

## 2024-07-21 VITALS — BP 130/80 | HR 86 | Ht 63.0 in | Wt 154.0 lb

## 2024-07-21 DIAGNOSIS — E041 Nontoxic single thyroid nodule: Secondary | ICD-10-CM

## 2024-07-21 NOTE — Progress Notes (Signed)
 Outpatient Endocrinology Note Sheila Birmingham, MD  07/21/24   Sheila [name] Aug 29, 1941 [id number]  Referring Provider: Fernand Tracey LABOR, MD Primary Care Provider: Fernand Tracey LABOR, MD Subjective  No chief complaint on file.   Assessment & Plan  Diagnoses and all orders for this visit:  Uninodular goiter   Sheila [name] is not taking any thyroid  medication.  Educated on thyroid  axis.  Recommend the following: labs WNL Repeat lab before next visit or sooner if symptoms of hyperthyroidism or hypothyroidism develop.  Notify us  immediately in case of significant weight gain or loss. Counseled on compliance and follow up needs.  History of thyroid  nodule s/p FNA that was benign per records but no actual FNA report available Last U'S last year per patient, no record 05/2024 U/S thyroid : IMPRESSION: The left-sided thyroid  nodule is not significantly changed and currently categorized as TR 4 and measures 3.8 cm (previously 4.1 cm). If the previous biopsy result was benign, consideration should be given toward follow-up surveillance ultrasound at the 3 and 5 year intervals. Chasing last FNA report, if cannot get it pt would like to repeat an FNA  I have reviewed current medications, nurse's notes, allergies, vital signs, past medical and surgical history, family medical history, and social history for this encounter. Counseled patient on symptoms, examination findings, lab findings, imaging results, treatment decisions and monitoring and prognosis. The patient understood the recommendations and agrees with the treatment plan. All questions regarding treatment plan were fully answered.   Return in about 2 months (around 09/20/2024).   Sheila Birmingham, MD  07/21/24   I have reviewed current medications, nurse's notes, allergies, vital signs, past medical and surgical history, family medical history, and social history for this encounter. Counseled patient on symptoms, examination  findings, lab findings, imaging results, treatment decisions and monitoring and prognosis. The patient understood the recommendations and agrees with the treatment plan. All questions regarding treatment plan were fully answered.   History of Present Illness Sheila [name] is a 83 y.o. year old female who presents to our clinic with thyroid  nodule diagnosed before 2016.   Symptoms suggestive of HYPOTHYROIDISM:  fatigue Yes weight gain No cold intolerance  Yes constipation  Yes, on linzess  Symptoms suggestive of HYPERTHYROIDISM:  weight loss  No heat intolerance No hyperdefecation  No palpitations  Yes, sometimes, had cardiac work up    Compressive symptoms:  dysphagia  Yes, sometimes  dysphonia  Yes, sometimes  positional dyspnea (especially with simultaneous arms elevation)  No  Smokes  No On biotin  No Personal history of head/neck surgery/irradiation  No  Physical Exam  BP 130/80   Pulse 86   Ht 5' 3 (1.6 m)   Wt 154 lb (69.9 kg)   SpO2 98%   BMI 27.28 kg/m  Constitutional: well developed, well nourished Head: normocephalic, atraumatic, no exophthalmos Eyes: sclera anicteric, no redness Neck: + thyromegaly, - thyroid  tenderness; + nodule, pemberton's sign -ve Lungs: normal respiratory effort Neurology: alert and oriented, no fine hand tremor Skin: dry, no appreciable rashes Musculoskeletal: no appreciable defects Psychiatric: normal mood and affect  Allergies Allergies  Allergen Reactions   Antihistamines, Chlorpheniramine-Type Anaphylaxis   Ceftin [Cefuroxime Axetil] Shortness Of Breath   Pheneen [Benzalkonium Chloride] Shortness Of Breath   Ativan [Lorazepam] Swelling   Meloxicam Palpitations   Morphine  Palpitations    Current Medications Patient's Medications  New Prescriptions   No medications on file  Previous Medications   ALBUTEROL  (VENTOLIN  HFA) 108 (90  BASE) MCG/ACT INHALER    Inhale 2 puffs into the lungs every 6 (six) hours as needed for  wheezing or shortness of breath.   ALPRAZOLAM (XANAX) 0.25 MG TABLET    Take 0.25 mg by mouth 2 (two) times daily as needed for anxiety.   ASPIRIN  EC 81 MG TABLET    Take 81 mg by mouth daily.   COENZYME Q10 (CO Q-10) 200 MG CAPS    Take 1 capsule by mouth daily.   DICYCLOMINE (BENTYL) 20 MG TABLET    Take 20 mg by mouth every 8 (eight) hours as needed for spasms.   DORZOLAMIDE (TRUSOPT) 2 % OPHTHALMIC SOLUTION    Place 1 drop into both eyes 2 (two) times daily.   DORZOLAMIDE-TIMOLOL (COSOPT) 2-0.5 % OPHTHALMIC SOLUTION    Place 1 drop into both eyes 2 (two) times daily.   FLOVENT HFA 110 MCG/ACT INHALER    Inhale 1 puff into the lungs 2 (two) times daily.   FLUTICASONE (FLONASE) 50 MCG/ACT NASAL SPRAY    Place 2 sprays into both nostrils daily.   FUROSEMIDE  (LASIX ) 20 MG TABLET    Please take this medication two times per week.   LATANOPROST (XALATAN) 0.005 % OPHTHALMIC SOLUTION    Place 1 drop into both eyes at bedtime.   LINACLOTIDE (LINZESS) 72 MCG CAPSULE    Take 72 mcg by mouth daily.   METOPROLOL  SUCCINATE (TOPROL -XL) 50 MG 24 HR TABLET    Take 1 tablet (50 mg total) by mouth daily. Take with or immediately following a meal.   MULTIPLE VITAMIN (MULTIVITAMIN) TABLET    Take 1 tablet by mouth daily.   NITROGLYCERIN  (NITROSTAT ) 0.4 MG SL TABLET    Place 1 tablet (0.4 mg total) under the tongue every 5 (five) minutes as needed for chest pain.   POTASSIUM CHLORIDE (MICRO-K) 10 MEQ CR CAPSULE    Take 10 mEq by mouth every morning.   ROSUVASTATIN  (CRESTOR ) 40 MG TABLET    TAKE 1 TABLET ON MONDAY, WEDNESDAY AND FRIDAY ONLY   SUCRALFATE (CARAFATE) 1 G TABLET    Take 1 g by mouth 3 (three) times daily as needed (GI).   TIOTROPIUM BROMIDE-OLODATEROL (STIOLTO RESPIMAT) 2.5-2.5 MCG/ACT AERS    Inhale 2 puffs into the lungs daily.   VERAPAMIL  (CALAN ) 40 MG TABLET    TAKE 1 TABLET BY MOUTH TWICE A DAY   VOQUEZNA 20 MG TABS    Take 1 tablet by mouth daily.  Modified Medications   No medications on file   Discontinued Medications   No medications on file    Past Medical History 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   Coronary artery disease involving native coronary artery of native heart without angina pectoris 10/10/2015   Depression    Dyslipidemia 10/10/2015   Dyspnea 11/21/2019   Essential hypertension 08/28/2016   Eustachian tube dysfunction, right 11/28/2022   GERD (gastroesophageal reflux disease)    Glaucoma    Headache above the eye region 11/10/2017   Heart murmur    History of right mastoidectomy 06/29/2020   Hyperlipemia 11/28/2022   Hyperlipidemia    Hypertension    Imbalance 04/17/2022  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  Similar morphology on prior echocardiograms without significant gradients reported.  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    EYE SURGERY     LEFT HEART CATH AND CORONARY ANGIOGRAPHY N/A 12/28/2017   Procedure: LEFT HEART CATH AND CORONARY ANGIOGRAPHY;  Surgeon: Claudene Victory ORN, MD;  Location: [facility];  Service: Cardiovascular;  Laterality: N/A;   MASTOIDECTOMY     PATENT DUCTUS ARTERIOUS REPAIR     SHOULDER OPEN ROTATOR CUFF REPAIR Bilateral    UPPER GASTROINTESTINAL ENDOSCOPY  10/15/2021    Family History family history includes Colon cancer in her brother; Diabetes in her brother and daughter; Heart attack in her mother and sister; Stomach cancer in her sister.  Social History Social History   Socioeconomic History   Marital status: Divorced    Spouse name: Not on file   Number of children: 5   Years of education: Not on file   Highest education level: Not on file  Occupational History   Occupation: Retired   Tobacco Use   Smoking status: Never   Smokeless tobacco: Never  Vaping Use   Vaping status: Never Used  Substance and Sexual Activity   Alcohol use: No   Drug use: Never   Sexual activity: Not Currently    Birth control/protection: Post-menopausal  Other Topics Concern   Not on file  Social History Narrative   Not on file   Social Drivers of Health   Financial Resource Strain: Low Risk  (07/13/2024)   Received from [facility]   Overall Financial Resource Strain (CARDIA)    How hard is it for you to pay for the very basics like food, housing, medical care, and heating?: Not hard at all  Food Insecurity: No Food Insecurity (07/13/2024)   Received from [facility]   Hunger Vital Sign    Within the past 12 months, you worried that your food would run out before you got the money to buy more.: Never true    Within the past 12 months, the food you bought just didn't last and you didn't have money to get more.: Never true   Transportation Needs: No Transportation Needs (07/13/2024)   Received from [facility]    In the past 12 months, has lack of transportation kept you from medical appointments or from getting medications?: No    In the past 12 months, has lack of transportation kept you from meetings, work, or from getting things needed for daily living?: No  Physical Activity: Not on file  Stress: Not on file  Social Connections: Not on file  Intimate Partner Violence: Not At Risk (01/28/2023)   Received from [facility]   Humiliation, Afraid, Rape, and Kick questionnaire    Within the last year, have you been afraid of your partner or ex-partner?: No    Within the last year, have you been humiliated or emotionally abused in other ways by your partner or ex-partner?: No    Within the last year, have you been kicked, hit, slapped, or otherwise physically hurt by your partner or ex-partner?: No    Within the last year, have you been raped or forced to have any kind of sexual activity by your partner or ex-partner?: No    Laboratory Investigations Lab Results  Component Value Date   TSH 0.51 05/18/2024   FREET4 1.4 05/18/2024     No results found for: TSI   No components found for: TRAB   Lab Results  Component Value Date   CHOL 169 03/17/2023   Lab Results  Component Value Date   HDL 55 03/17/2023   Lab Results  Component Value Date   LDLCALC 98 03/17/2023   Lab Results  Component Value Date   TRIG 88 03/17/2023   Lab Results  Component Value Date   CHOLHDL 3.1 03/17/2023   Lab Results  Component Value Date   CREATININE 0.48 11/04/2023   Lab Results  Component Value Date   GFR 84.61 08/20/2023      Component Value Date/Time   NA 136 11/04/2023 1531   NA 141 01/12/2023 1003   K 3.8 11/04/2023 1531   CL 100 11/04/2023 1531   CO2 26 11/04/2023 1531   GLUCOSE 74 11/04/2023 1531   BUN 13 11/04/2023 1531   BUN 13  01/12/2023 1003   CREATININE 0.48 11/04/2023 1531   CALCIUM  9.4 11/04/2023 1531   PROT 7.0 11/04/2023 1531   PROT 6.9 03/17/2023 1028   ALBUMIN 3.7 11/04/2023 1531   ALBUMIN 4.2 03/17/2023 1028   AST 15 11/04/2023 1531   ALT 15 11/04/2023 1531   ALKPHOS 55 11/04/2023 1531   BILITOT 0.8 11/04/2023 1531   BILITOT 0.3 03/17/2023 1028   GFRNONAA >60 11/04/2023 1531   GFRAA 86 02/21/2020 1614      Latest Ref Rng & Units 11/04/2023    3:31 PM 08/20/2023    9:45 AM 01/12/2023   10:03 AM  BMP  Glucose 70 - 99 mg/dL 74  56  98   BUN 8 - 23 mg/dL 13  11  13    Creatinine 0.44 - 1.00 mg/dL 9.51  9.41  9.34   BUN/Creat Ratio 12 - 28   20   Sodium 135 - 145 mmol/L 136  140  141   Potassium 3.5 - 5.1 mmol/L 3.8  3.8  3.9   Chloride 98 - 111 mmol/L 100  101  99   CO2 22 - 32 mmol/L 26  31  27    Calcium  8.9 - 10.3 mg/dL 9.4  9.5  9.9        Component Value Date/Time   WBC 5.1 11/04/2023 1531   RBC 5.15 (H) 11/04/2023 1531   HGB 13.6 01/18/2024 1225   HCT 44.2 01/18/2024 1225   PLT 197 11/04/2023 1531   MCV 82.5 11/04/2023 1531   MCH 26.2 11/04/2023 1531   MCHC 31.8 11/04/2023 1531   RDW 14.9 11/04/2023 1531   LYMPHSABS 2.1 11/04/2023 1531   MONOABS 0.7 11/04/2023 1531  EOSABS 0.2 11/04/2023 1531   BASOSABS 0.0 11/04/2023 1531      Parts of this note may have been dictated using voice recognition software. There may be variances in spelling and vocabulary which are unintentional. Not all errors are proofread. Please notify the dino if any discrepancies are noted or if the meaning of any statement is not clear.

## 2024-08-19 DIAGNOSIS — H04129 Dry eye syndrome of unspecified lacrimal gland: Secondary | ICD-10-CM | POA: Diagnosis not present

## 2024-08-19 DIAGNOSIS — H402230 Chronic angle-closure glaucoma, bilateral, stage unspecified: Secondary | ICD-10-CM | POA: Diagnosis not present

## 2024-09-16 ENCOUNTER — Ambulatory Visit (HOSPITAL_BASED_OUTPATIENT_CLINIC_OR_DEPARTMENT_OTHER)
Admission: EM | Admit: 2024-09-16 | Discharge: 2024-09-16 | Disposition: A | Discharge status: Home / Self Care | Attending: Family Medicine | Admitting: Family Medicine

## 2024-09-16 ENCOUNTER — Encounter (HOSPITAL_BASED_OUTPATIENT_CLINIC_OR_DEPARTMENT_OTHER): Payer: Self-pay | Admitting: Emergency Medicine

## 2024-09-16 DIAGNOSIS — R079 Chest pain, unspecified: Secondary | ICD-10-CM

## 2024-09-16 DIAGNOSIS — K219 Gastro-esophageal reflux disease without esophagitis: Secondary | ICD-10-CM

## 2024-09-16 MED ORDER — OMEPRAZOLE 40 MG PO CPDR: 40.0000 mg | DELAYED_RELEASE_CAPSULE | Freq: Every day | ORAL | Status: AC

## 2024-09-16 MED ORDER — SUCRALFATE 1 G PO TABS: 1.0000 g | ORAL_TABLET | Freq: Three times a day (TID) | ORAL | 0 refills | Status: AC

## 2024-09-16 MED ORDER — LIDOCAINE VISCOUS HCL 2 % MT SOLN
15.0000 mL | Freq: Once | OROMUCOSAL | Status: AC
  Administered 2024-09-16: 15 mL via OROMUCOSAL

## 2024-09-16 MED ORDER — ALUM & MAG HYDROXIDE-SIMETH 200-200-20 MG/5ML PO SUSP
30.0000 mL | Freq: Once | ORAL | Status: AC
  Administered 2024-09-16: 30 mL via ORAL

## 2024-09-16 NOTE — ED Provider Notes (Signed)
 " [facility]    CSN: [id number] Arrival date & time: 09/16/24  1726      History   Chief Complaint No chief complaint on file.   HPI Sheila [name] is a 84 y.o. female.   84 year old female with heartburn/GERD.  She has been prescribed Voquezna by another provider but it cost over $100 and she has not been able to afford it.  Her primary care has been giving her samples but she has not been able to get any more samples and she has run out.  She is having upper back pain and chest pain and trying to eat carefully.  She saw pulmonary this week and everything was good with the pulmonologist.  She needs help with her acid reflux.  She says the pain is intermittent but often severe and she does not know if it is cardiac or gastric.  She thinks it is acid reflux but she is not positive.  Currently she is taking Gaviscon as an OTC medication.  She was on omeprazole  40 mg at 1 time but was taken off of that and she does not know why omeprazole  was discontinued.     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   Coronary artery disease involving native coronary artery of native heart without angina pectoris 10/10/2015   Depression    Dyslipidemia 10/10/2015   Dyspnea 11/21/2019   Essential hypertension 08/28/2016   Eustachian tube dysfunction, right 11/28/2022   GERD (gastroesophageal reflux disease)    Glaucoma    Headache above the eye region 11/10/2017   Heart murmur    History of right mastoidectomy 06/29/2020   Hyperlipemia 11/28/2022   Hyperlipidemia    Hypertension    Imbalance 04/17/2022   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  Similar morphology on prior echocardiograms without significant gradients reported.  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   Lightheadedness 11/16/2023   Non-recurrent acute serous otitis media of right ear 06/18/2023   Chest discomfort 06/02/2023   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   Traumatic hematoma of forehead 02/05/2023   Periorbital ecchymosis of left eye 02/05/2023   Subdural hemorrhage (HCC) 01/28/2023   Eustachian tube dysfunction, right 11/28/2022   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 11/28/2022  Hyperlipemia 11/28/2022   Imbalance 04/17/2022   Otalgia, right ear 04/17/2022   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   Chronic infective otitis externa, 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   Mild aortic stenosis 02/21/2021   Allergy    Anxiety    Arthritis    Asthma    Blood transfusion without reported diagnosis    Cataract    Chronic headaches    Depression    Glaucoma    Heart murmur    Hyperlipidemia    Hypertension    History of right mastoidectomy 06/29/2020    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    EYE SURGERY     LEFT HEART CATH AND CORONARY ANGIOGRAPHY N/A 12/28/2017   Procedure: LEFT HEART CATH AND CORONARY ANGIOGRAPHY;  Surgeon: Claudene Victory ORN, MD;  Location: [facility];  Service: Cardiovascular;  Laterality: N/A;   MASTOIDECTOMY     PATENT DUCTUS ARTERIOUS REPAIR     SHOULDER OPEN ROTATOR CUFF REPAIR Bilateral    UPPER GASTROINTESTINAL ENDOSCOPY  10/15/2021    OB History   No obstetric history on file.      Home Medications    Prior to Admission medications  Medication Sig Start Date End Date Taking? Authorizing Provider  latanoprost (XALATAN) 0.005 % ophthalmic solution Place 1 drop into both eyes at bedtime. 10/15/22  Yes [provider]  metoprolol  succinate (TOPROL -XL) 50 MG 24 hr tablet Take 1 tablet (50 mg total) by mouth daily. Take with or immediately following a meal. 11/16/23 09/16/24 Yes Madireddy, Alean SAUNDERS, MD  omeprazole  (PRILOSEC) 40 MG capsule Take 1 capsule (40 mg total) by mouth daily. 09/16/24  Yes Ival Domino, FNP  rosuvastatin  (CRESTOR ) 40 MG tablet TAKE 1 TABLET ON MONDAY, WEDNESDAY AND FRIDAY ONLY 07/15/24  Yes Madireddy, Alean SAUNDERS, MD  sucralfate  (CARAFATE ) 1 g tablet Take 1 tablet (1 g total) by mouth 4 (four) times daily -  with meals and at bedtime. 09/16/24  Yes  Ival Domino, FNP  verapamil  (CALAN ) 40 MG tablet TAKE 1 TABLET BY MOUTH TWICE A DAY 03/04/24  Yes Madireddy, Alean SAUNDERS, MD  albuterol  (VENTOLIN  HFA) 108 (90 Base) MCG/ACT inhaler Inhale 2 puffs into the lungs every 6 (six) hours as needed for wheezing or shortness of breath. 07/24/21   [provider]  ALPRAZolam (XANAX) 0.25 MG tablet Take 0.25 mg by mouth 2 (two) times daily as needed for anxiety.    [provider]  aspirin  EC 81 MG tablet Take 81 mg by mouth daily.    [provider]  Coenzyme Q10 (CO Q-10) 200 MG CAPS Take 1 capsule by mouth daily.    [provider]  dicyclomine (BENTYL) 20 MG tablet Take 20 mg by mouth every 8 (eight) hours as needed for spasms. 07/16/23   [provider]  dorzolamide (TRUSOPT) 2 % ophthalmic solution Place 1 drop into both eyes 2 (two)  times daily. 03/14/22   [provider]  dorzolamide-timolol (COSOPT) 2-0.5 % ophthalmic solution Place 1 drop into both eyes 2 (two) times daily. 08/28/23   [provider]  FLOVENT HFA 110 MCG/ACT inhaler Inhale 1 puff into the lungs 2 (two) times daily. 11/29/20   [provider]  fluticasone (FLONASE) 50 MCG/ACT nasal spray Place 2 sprays into both nostrils daily. 11/29/20   [provider]  furosemide  (LASIX ) 20 MG tablet Please take this medication two times per week. 04/25/24   Madireddy, Alean SAUNDERS, MD  linaclotide LARUE) 72 MCG capsule Take 72 mcg by mouth daily.    [provider]  Multiple Vitamin (MULTIVITAMIN) tablet Take 1 tablet by mouth daily.    [provider]  nitroGLYCERIN  (NITROSTAT ) 0.4 MG SL tablet Place 1 tablet (0.4 mg total) under the tongue every 5 (five) minutes as needed for chest pain. 10/06/23   Madireddy, Alean SAUNDERS, MD  potassium chloride (MICRO-K) 10 MEQ CR capsule Take 10 mEq by mouth every morning. 10/24/20   [provider]  Tiotropium Bromide-Olodaterol (STIOLTO RESPIMAT) 2.5-2.5 MCG/ACT  AERS Inhale 2 puffs into the lungs daily.    [provider]  VOQUEZNA 20 MG TABS Take 1 tablet by mouth daily. 04/28/23   [provider]    Family History Family History  Problem Relation Age of Onset   Heart attack Mother    Heart attack Sister    Stomach cancer Sister    Colon cancer Brother    Diabetes Brother    Diabetes Daughter    Esophageal cancer Neg Hx    Colon polyps Neg Hx    Rectal cancer Neg Hx     Social History Social History[1]   Allergies   Antihistamines, chlorpheniramine-type; Ceftin [cefuroxime axetil]; Pheneen [benzalkonium chloride]; Ativan [lorazepam]; Meloxicam; and Morphine    Review of Systems Review of Systems  Constitutional:  Negative for chills and fever.  HENT:  Negative for ear pain and sore throat.   Eyes:  Negative for pain and visual disturbance.  Respiratory:  Negative for cough and shortness of breath.   Cardiovascular:  Positive for chest pain. Negative for palpitations.  Gastrointestinal:  Positive for abdominal pain. Negative for constipation, diarrhea, nausea and vomiting.  Genitourinary:  Negative for dysuria and hematuria.  Musculoskeletal:  Positive for back pain. Negative for arthralgias.  Skin:  Negative for color change and rash.  Neurological:  Negative for seizures and syncope.  All other systems reviewed and are negative.    Physical Exam Triage Vital Signs ED Triage Vitals  Encounter Vitals Group     BP 09/16/24 1751 (!) 151/82     Girls Systolic BP Percentile --      Girls Diastolic BP Percentile --      Boys Systolic BP Percentile --      Boys Diastolic BP Percentile --      Pulse Rate 09/16/24 1751 60     Resp 09/16/24 1751 18     Temp 09/16/24 1751 98.4 F (36.9 C)     Temp Source 09/16/24 1751 Oral     SpO2 09/16/24 1751 97 %     Weight --      Height --      Head Circumference --      Peak Flow --      Pain Score 09/16/24 1749 5     Pain Loc --      Pain Education --      Exclude  from  Growth Chart --    No data found.  Updated Vital Signs BP (!) 151/82 (BP Location: Left Arm)   Pulse 60   Temp 98.4 F (36.9 C) (Oral)   Resp 18   SpO2 97%   Visual Acuity Right Eye Distance:   Left Eye Distance:   Bilateral Distance:    Right Eye Near:   Left Eye Near:    Bilateral Near:     Physical Exam Vitals and nursing note reviewed.  Constitutional:      General: She is not in acute distress.    Appearance: She is well-developed. She is not ill-appearing or toxic-appearing.  HENT:     Head: Normocephalic and atraumatic.     Right Ear: Hearing, tympanic membrane, ear canal and external ear normal.     Left Ear: Hearing, tympanic membrane, ear canal and external ear normal.     Nose: No congestion or rhinorrhea.     Right Sinus: No maxillary sinus tenderness or frontal sinus tenderness.     Left Sinus: No maxillary sinus tenderness or frontal sinus tenderness.     Mouth/Throat:     Lips: Pink.     Mouth: Mucous membranes are moist.     Pharynx: Uvula midline. No oropharyngeal exudate or posterior oropharyngeal erythema.     Tonsils: No tonsillar exudate.  Eyes:     Conjunctiva/sclera: Conjunctivae normal.     Pupils: Pupils are equal, round, and reactive to light.  Cardiovascular:     Rate and Rhythm: Normal rate and regular rhythm.     Heart sounds: S1 normal and S2 normal. No murmur heard. Pulmonary:     Effort: Pulmonary effort is normal. No respiratory distress.     Breath sounds: Normal breath sounds. No decreased breath sounds, wheezing, rhonchi or rales.  Chest:     Chest wall: Tenderness (Mild tenderness in the upper anterior chest wall.  Patient reports she has been told she has significant arthritis in this discomfort with palpation is secondary to arthritis.) present. No mass, lacerations, deformity, swelling, crepitus or edema. There is no dullness to percussion.  Abdominal:     General: Bowel sounds are normal.     Palpations: Abdomen is soft.      Tenderness: There is abdominal tenderness (Abdominal pain is worse in the right upper quadrant and does refer to her mid back.) in the right upper quadrant, epigastric area and left upper quadrant. There is no right CVA tenderness, left CVA tenderness, guarding or rebound. Negative signs include Murphy's sign, Rovsing's sign and McBurney's sign.  Musculoskeletal:        General: No swelling.     Cervical back: Neck supple.  Lymphadenopathy:     Head:     Right side of head: No submental, submandibular, tonsillar, preauricular or posterior auricular adenopathy.     Left side of head: No submental, submandibular, tonsillar, preauricular or posterior auricular adenopathy.     Cervical: No cervical adenopathy.     Right cervical: No superficial cervical adenopathy.    Left cervical: No superficial cervical adenopathy.  Skin:    General: Skin is warm and dry.     Capillary Refill: Capillary refill takes less than 2 seconds.     Findings: No rash.  Neurological:     Mental Status: She is alert and oriented to person, place, and time.  Psychiatric:        Mood and Affect: Mood normal.      UC Treatments / Results  Labs (all labs ordered are listed, but only abnormal results are displayed) Basic metabolic panel: 11/04/23:      Component Ref Range & Units (hover) 1 yr ago (12/29/22)  Glucose 126 High   BUN 9  Creatinine, Ser 0.67  eGFR 88  BUN/Creatinine Ratio 13  Sodium 141  Potassium 4.0  Chloride 100  CO2 23  Calcium  9.3  Resulting Agency [facility]      EKG   Radiology No results found.  Procedures ED EKG  Date/Time: 09/16/2024 7:35 PM  Performed by: Ival Domino, FNP Authorized by: Ival Domino, FNP   ECG interpreted by ED Physician in the absence of a cardiologist: yes Teddi Ival, FNP)   Previous ECG:    Previous ECG:  Compared to current (11/04/23)   Comparison ECG info:  EKG from 11/04/2023 shows sinus rhythm, right atrial enlargement and some changes  consistent with left ventricular hypertrophy with ST abnormalities that could indicate an inferior ischemia of undetermined age.  The EKG today shows sinus bradycardia, right atrial enlargement, indications for left ventricular hypertrophy and ST/T wave abnormalities that could indicate anterior lateral or inferior ischemia. Interpretation:    Interpretation: abnormal     Details:  Sinus bradycardia, right atrial enlargement, indications for left ventricular hypertrophy and ST/T wave abnormalities that could indicate anterior lateral or inferior ischemia Rate:    ECG rate:  55   ECG rate assessment: bradycardic   Rhythm:    Rhythm: sinus bradycardia   Ectopy:    Ectopy: none   QRS:    QRS axis:  Normal   QRS intervals:  Normal   QRS conduction: normal   ST segments:    ST segments:  Non-specific T waves:    T waves: non-specific   Q waves:    Abnormal Q-waves: not present    (including critical care time)  Medications Ordered in UC Medications  alum & mag hydroxide-simeth (MAALOX/MYLANTA) 200-200-20 MG/5ML suspension 30 mL (30 mLs Oral Given 09/16/24 1920)  lidocaine  (XYLOCAINE ) 2 % viscous mouth solution 15 mL (15 mLs Mouth/Throat Given 09/16/24 1920)    Initial Impression / Assessment and Plan / UC Course  I have reviewed the triage vital signs and the nursing notes.  Pertinent labs & imaging results that were available during my care of the patient were reviewed by me and considered in my medical decision making (see chart for details).  Plan of Care (see discharge instructions for additional patient precautions and education): Chest pain: ECG shows some changes that are abnormal with ST abnormalities but they are essentially unchanged from 11/04/2023.  The patient's chest pain completely resolved with the use of a GI cocktail.  Restart omeprazole  40 mg daily and take it at night.  Sucralfate  1 g, 1 pill 4 times daily for heartburn.  Contact primary care on Monday to discuss  medication options for the symptoms.  If chest pain recurs or worsens call 911 for transport to an emergency room for further workup.  I reviewed the plan of care with the patient and/or the patient's guardian.  The patient and/or guardian had time to ask questions and acknowledged that the questions were answered.  Final Clinical Impressions(s) / UC Diagnoses   Final diagnoses:  Chest pain, unspecified type  Gastroesophageal reflux disease without esophagitis     Discharge Instructions      Chest pain: ECG shows some changes that are abnormal with ST abnormalities but they are essentially unchanged from 11/04/2023.  The patient's chest pain completely  resolved with the use of a GI cocktail.  Restart omeprazole  40 mg daily and take it at night.  Sucralfate  1 g, 1 pill 4 times daily for heartburn.  Contact primary care on Monday to discuss medication options for the symptoms.  If chest pain recurs or worsens call 911 for transport to an emergency room for further workup.     ED Prescriptions     Medication Sig Dispense Auth. Provider   omeprazole  (PRILOSEC) 40 MG capsule Take 1 capsule (40 mg total) by mouth daily. -- Ival Domino, FNP   sucralfate  (CARAFATE ) 1 g tablet Take 1 tablet (1 g total) by mouth 4 (four) times daily -  with meals and at bedtime. 120 tablet Ival Domino, FNP      PDMP not reviewed this encounter.    [1]  Social History Tobacco Use   Smoking status: Never   Smokeless tobacco: Never  Vaping Use   Vaping status: Never Used  Substance Use Topics   Alcohol use: No   Drug use: Never     Ival Domino, FNP 09/16/24 1946  "

## 2024-09-16 NOTE — ED Triage Notes (Signed)
 Pt  was prescribed Voquezna for her heartburn but she was unable to buy it because it was over $100 she had been getting samples at her PCP but recently she hasn't been able to get any more samples. Pt c/o chest discomfort and back pain she has been trying to monitor what she eats. Pt did see her pulmonary doctor this past week.

## 2024-09-16 NOTE — Discharge Instructions (Addendum)
 Chest pain: ECG shows some changes that are abnormal with ST abnormalities but they are essentially unchanged from 11/04/2023.  The patient's chest pain completely resolved with the use of a GI cocktail.  Restart omeprazole  40 mg daily and take it at night.  Sucralfate  1 g, 1 pill 4 times daily for heartburn.  Contact primary care on Monday to discuss medication options for the symptoms.  If chest pain recurs or worsens call 911 for transport to an emergency room for further workup.

## 2024-09-21 ENCOUNTER — Encounter: Payer: Self-pay | Admitting: "Endocrinology

## 2024-09-21 ENCOUNTER — Ambulatory Visit: Admitting: "Endocrinology

## 2024-09-21 VITALS — BP 102/80 | HR 82 | Ht 63.0 in | Wt 151.0 lb

## 2024-09-21 DIAGNOSIS — E041 Nontoxic single thyroid nodule: Secondary | ICD-10-CM

## 2024-09-21 NOTE — Progress Notes (Signed)
 "    Outpatient Endocrinology Note Sheila Birmingham, MD  09/21/2024   [name] [birth year] [id number]  Referring Provider: Fernand Tracey LABOR, MD Primary Care Provider: Fernand Tracey LABOR, MD Subjective  No chief complaint on file.   Assessment & Plan  Diagnoses and all orders for this visit:  Uninodular goiter -     US  FNA BX THYROID  1ST LESION AFIRMA; Future    [name] is not taking any thyroid  medication.  Educated on thyroid  axis.  Recommend the following: labs WNL Repeat lab before next visit or sooner if symptoms of hyperthyroidism or hypothyroidism develop.  Notify us  immediately in case of significant weight gain or loss. Counseled on compliance and follow up needs.  History of thyroid  nodule s/p FNA that was benign per records but no actual FNA report available Last U'S last year per patient, no record 05/2024 U/S thyroid : IMPRESSION: The left-sided thyroid  nodule is not significantly changed and currently categorized as TR 4 and measures 3.8 cm (previously 4.1 cm). If the previous biopsy result was benign, consideration should be given toward follow-up surveillance ultrasound at the 3 and 5 year intervals. Chasing last FNA report, if cannot get it pt would like to repeat an FNA  I have reviewed current medications, nurse's notes, allergies, vital signs, past medical and surgical history, family medical history, and social history for this encounter. Counseled patient on symptoms, examination findings, lab findings, imaging results, treatment decisions and monitoring and prognosis. The patient understood the recommendations and agrees with the treatment plan. All questions regarding treatment plan were fully answered.   No follow-ups on file.   Sheila Birmingham, MD  09/21/2024   I have reviewed current medications, nurse's notes, allergies, vital signs, past medical and surgical history, family medical history, and social history for this encounter. Counseled  patient on symptoms, examination findings, lab findings, imaging results, treatment decisions and monitoring and prognosis. The patient understood the recommendations and agrees with the treatment plan. All questions regarding treatment plan were fully answered.   History of Present Illness Sheila [name] is a 84 y.o. year old female who presents to our clinic with thyroid  nodule diagnosed before 2016.   09/16/24: C/o pain in chest and back, went to ER and and help with GERD medication  Symptoms suggestive of HYPOTHYROIDISM:  fatigue Yes weight gain No cold intolerance  Yes constipation  Yes, on linzess  Symptoms suggestive of HYPERTHYROIDISM:  weight loss  No heat intolerance No hyperdefecation  No palpitations  No, sometimes, had cardiac work up    Compressive symptoms:  dysphagia  No, dysphonia  Yes, sometimes  positional dyspnea (especially with simultaneous arms elevation)  No  Smokes  No On biotin  No Personal history of head/neck surgery/irradiation  No  Physical Exam  BP 102/80   Pulse 82   Ht 5' 3 (1.6 m)   Wt 151 lb (68.5 kg)   SpO2 96%   BMI 26.75 kg/m  Constitutional: well developed, well nourished Head: normocephalic, atraumatic, no exophthalmos Eyes: sclera anicteric, no redness Neck: - thyromegaly, - thyroid  tenderness; - nodule, pemberton's sign -ve Lungs: normal respiratory effort Neurology: alert and oriented, no fine hand tremor Skin: dry, no appreciable rashes Musculoskeletal: no appreciable defects Psychiatric: normal mood and affect  Allergies Allergies  Allergen Reactions   Antihistamines, Chlorpheniramine-Type Anaphylaxis   Ceftin [Cefuroxime Axetil] Shortness Of Breath   Pheneen [Benzalkonium Chloride] Shortness Of Breath   Ativan [Lorazepam] Swelling   Meloxicam Palpitations   Morphine   Palpitations    Current Medications Patient's Medications  New Prescriptions   No medications on file  Previous Medications   ALBUTEROL   (VENTOLIN  HFA) 108 (90 BASE) MCG/ACT INHALER    Inhale 2 puffs into the lungs every 6 (six) hours as needed for wheezing or shortness of breath.   ALPRAZOLAM (XANAX) 0.25 MG TABLET    Take 0.25 mg by mouth 2 (two) times daily as needed for anxiety.   ASPIRIN  EC 81 MG TABLET    Take 81 mg by mouth daily.   COENZYME Q10 (CO Q-10) 200 MG CAPS    Take 1 capsule by mouth daily.   DICYCLOMINE (BENTYL) 20 MG TABLET    Take 20 mg by mouth every 8 (eight) hours as needed for spasms.   DORZOLAMIDE (TRUSOPT) 2 % OPHTHALMIC SOLUTION    Place 1 drop into both eyes 2 (two) times daily.   DORZOLAMIDE-TIMOLOL (COSOPT) 2-0.5 % OPHTHALMIC SOLUTION    Place 1 drop into both eyes 2 (two) times daily.   FLOVENT HFA 110 MCG/ACT INHALER    Inhale 1 puff into the lungs 2 (two) times daily.   FLUTICASONE (FLONASE) 50 MCG/ACT NASAL SPRAY    Place 2 sprays into both nostrils daily.   FUROSEMIDE  (LASIX ) 20 MG TABLET    Please take this medication two times per week.   LATANOPROST (XALATAN) 0.005 % OPHTHALMIC SOLUTION    Place 1 drop into both eyes at bedtime.   LINACLOTIDE (LINZESS) 72 MCG CAPSULE    Take 72 mcg by mouth daily.   METOPROLOL  SUCCINATE (TOPROL -XL) 50 MG 24 HR TABLET    Take 1 tablet (50 mg total) by mouth daily. Take with or immediately following a meal.   MULTIPLE VITAMIN (MULTIVITAMIN) TABLET    Take 1 tablet by mouth daily.   NITROGLYCERIN  (NITROSTAT ) 0.4 MG SL TABLET    Place 1 tablet (0.4 mg total) under the tongue every 5 (five) minutes as needed for chest pain.   OMEPRAZOLE  (PRILOSEC) 40 MG CAPSULE    Take 1 capsule (40 mg total) by mouth daily.   POTASSIUM CHLORIDE (MICRO-K) 10 MEQ CR CAPSULE    Take 10 mEq by mouth every morning.   ROSUVASTATIN  (CRESTOR ) 40 MG TABLET    TAKE 1 TABLET ON MONDAY, WEDNESDAY AND FRIDAY ONLY   SUCRALFATE  (CARAFATE ) 1 G TABLET    Take 1 tablet (1 g total) by mouth 4 (four) times daily -  with meals and at bedtime.   TIOTROPIUM BROMIDE-OLODATEROL (STIOLTO RESPIMAT) 2.5-2.5  MCG/ACT AERS    Inhale 2 puffs into the lungs daily.   VERAPAMIL  (CALAN ) 40 MG TABLET    TAKE 1 TABLET BY MOUTH TWICE A DAY   VOQUEZNA 20 MG TABS    Take 1 tablet by mouth daily.  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   Coronary artery disease involving native coronary artery of native heart without angina pectoris 10/10/2015   Depression    Dyslipidemia 10/10/2015   Dyspnea 11/21/2019   Essential hypertension 08/28/2016  Eustachian tube dysfunction, right 11/28/2022   GERD (gastroesophageal reflux disease)    Glaucoma    Headache above the eye region 11/10/2017   Heart murmur    History of right mastoidectomy 06/29/2020   Hyperlipemia 11/28/2022   Hyperlipidemia    Hypertension    Imbalance 04/17/2022   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  Similar morphology on prior echocardiograms without significant gradients reported.  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    EYE SURGERY     LEFT HEART CATH AND CORONARY ANGIOGRAPHY N/A 12/28/2017   Procedure: LEFT HEART CATH AND CORONARY ANGIOGRAPHY;  Surgeon: Claudene Victory ORN, MD;  Location: [facility];  Service: Cardiovascular;  Laterality: N/A;   MASTOIDECTOMY     PATENT DUCTUS ARTERIOUS REPAIR     SHOULDER OPEN ROTATOR CUFF REPAIR Bilateral    UPPER GASTROINTESTINAL ENDOSCOPY  10/15/2021    Family History family history includes Colon cancer in her brother; Diabetes in her brother and daughter; Heart attack in her mother and sister; Stomach cancer in her sister.  Social History Social History   Socioeconomic History   Marital status: Divorced    Spouse name: Not on file   Number of children: 5   Years of education: Not on file   Highest education level: Not on file  Occupational History   Occupation: Retired   Tobacco Use   Smoking status: Never   Smokeless tobacco: Never  Vaping Use   Vaping status: Never Used  Substance and Sexual Activity   Alcohol use: No   Drug use: Never   Sexual activity: Not Currently    Birth control/protection: Post-menopausal  Other Topics Concern   Not on file  Social History Narrative   Not on file   Social Drivers of Health   Tobacco Use: Low Risk (09/21/2024)   Patient History    Smoking Tobacco Use: Never    Smokeless Tobacco Use: Never    Passive Exposure: Not on file  Financial Resource Strain: Low Risk (07/13/2024)   Received from [facility]   Overall Financial Resource Strain (CARDIA)    How hard is it for you to pay for the very basics like food, housing, medical  care, and heating?: Not hard at all  Food Insecurity: No Food Insecurity (07/13/2024)   Received from [facility]   Epic    Within the past 12 months, you worried that your food would run out before you got the money to buy more.: Never true    Within the past 12 months, the food you bought just didn't  last and you didn't have money to get more.: Never true  Transportation Needs: No Transportation Needs (07/13/2024)   Received from [facility]    In the past 12 months, has lack of transportation kept you from medical appointments or from getting medications?: No    In the past 12 months, has lack of transportation kept you from meetings, work, or from getting things needed for daily living?: No  Physical Activity: Not on file  Stress: Not on file  Social Connections: Not on file  Intimate Partner Violence: Not At Risk (01/28/2023)   [facility] from [facility]   Epic    Within the last year, have you been afraid of your partner or ex-partner?: No    Within the last year, have you been humiliated or emotionally abused in other ways by your partner or ex-partner?: No    Within the last year, have you been kicked, hit, slapped, or otherwise physically hurt by your partner or ex-partner?: No    Within the last year, have you been raped or forced to have any kind of sexual activity by your partner or ex-partner?: No  Depression (PHQ2-9): Not on file  Alcohol Screen: Not on file  Housing: Low Risk (07/13/2024)   Received from [facility]    In the last 12 months, was there a time when you were not able to pay the mortgage or rent on time?: No    In the past 12 months, how many times have you moved where you were living?: 0    At any time in the past 12 months, were you homeless or living in a shelter (including now)?: No  Utilities: Not At Risk (07/13/2024)   Received from [facility]   Epic    In the past 12 months has the electric, gas, oil, or  water company threatened to shut off services in your home?: No  Health Literacy: Not on file    Laboratory Investigations Lab Results  Component Value Date   TSH 0.51 05/18/2024   FREET4 1.4 05/18/2024     No results found for: TSI   No components found for: TRAB   Lab Results  Component Value Date   CHOL 169 03/17/2023   Lab Results  Component Value Date   HDL 55 03/17/2023   Lab Results  Component Value Date   LDLCALC 98 03/17/2023   Lab Results  Component Value Date   TRIG 88 03/17/2023   Lab Results  Component Value Date   CHOLHDL 3.1 03/17/2023   Lab Results  Component Value Date   CREATININE 0.48 11/04/2023   Lab Results  Component Value Date   GFR 84.61 08/20/2023      Component Value Date/Time   NA 136 11/04/2023 1531   NA 141 01/12/2023 1003   K 3.8 11/04/2023 1531   CL 100 11/04/2023 1531   CO2 26 11/04/2023 1531   GLUCOSE 74 11/04/2023 1531   BUN 13 11/04/2023 1531   BUN 13 01/12/2023 1003   CREATININE 0.48 11/04/2023 1531   CALCIUM  9.4 11/04/2023 1531   PROT 7.0 11/04/2023 1531   PROT 6.9 03/17/2023 1028   ALBUMIN 3.7 11/04/2023 1531   ALBUMIN 4.2 03/17/2023 1028   AST 15 11/04/2023 1531   ALT 15 11/04/2023 1531   ALKPHOS 55 11/04/2023 1531   BILITOT 0.8 11/04/2023 1531   BILITOT 0.3 03/17/2023 1028   GFRNONAA >60 11/04/2023 1531  GFRAA 86 02/21/2020 1614      Latest Ref Rng & Units 11/04/2023    3:31 PM 08/20/2023    9:45 AM 01/12/2023   10:03 AM  BMP  Glucose 70 - 99 mg/dL 74  56  98   BUN 8 - 23 mg/dL 13  11  13    Creatinine 0.44 - 1.00 mg/dL 9.51  9.41  9.34   BUN/Creat Ratio 12 - 28   20   Sodium 135 - 145 mmol/L 136  140  141   Potassium 3.5 - 5.1 mmol/L 3.8  3.8  3.9   Chloride 98 - 111 mmol/L 100  101  99   CO2 22 - 32 mmol/L 26  31  27    Calcium  8.9 - 10.3 mg/dL 9.4  9.5  9.9        Component Value Date/Time   WBC 5.1 11/04/2023 1531   RBC 5.15 (H) 11/04/2023 1531   HGB 13.6 01/18/2024 1225   HCT 44.2  01/18/2024 1225   PLT 197 11/04/2023 1531   MCV 82.5 11/04/2023 1531   MCH 26.2 11/04/2023 1531   MCHC 31.8 11/04/2023 1531   RDW 14.9 11/04/2023 1531   LYMPHSABS 2.1 11/04/2023 1531   MONOABS 0.7 11/04/2023 1531   EOSABS 0.2 11/04/2023 1531   BASOSABS 0.0 11/04/2023 1531      Parts of this note may have been dictated using voice recognition software. There may be variances in spelling and vocabulary which are unintentional. Not all errors are proofread. Please notify the dino if any discrepancies are noted or if the meaning of any statement is not clear.    "

## 2025-09-13 ENCOUNTER — Other Ambulatory Visit

## 2025-09-20 ENCOUNTER — Ambulatory Visit: Admitting: "Endocrinology
